# Patient Record
Sex: Female | Born: 1955 | Race: White | Hispanic: No | Marital: Married | Smoking: Never smoker
Health system: Southern US, Community
[De-identification: ages and names within clinical notes are randomized; demographics above are authoritative.]

## PROBLEM LIST (undated history)

## (undated) DIAGNOSIS — Z8614 Personal history of Methicillin resistant Staphylococcus aureus infection: Secondary | ICD-10-CM

## (undated) DIAGNOSIS — R112 Nausea with vomiting, unspecified: Secondary | ICD-10-CM

## (undated) DIAGNOSIS — Z9889 Other specified postprocedural states: Secondary | ICD-10-CM

## (undated) DIAGNOSIS — Z8709 Personal history of other diseases of the respiratory system: Secondary | ICD-10-CM

## (undated) DIAGNOSIS — M779 Enthesopathy, unspecified: Secondary | ICD-10-CM

## (undated) DIAGNOSIS — R42 Dizziness and giddiness: Secondary | ICD-10-CM

## (undated) DIAGNOSIS — M722 Plantar fascial fibromatosis: Secondary | ICD-10-CM

## (undated) DIAGNOSIS — M2550 Pain in unspecified joint: Secondary | ICD-10-CM

## (undated) DIAGNOSIS — M1990 Unspecified osteoarthritis, unspecified site: Secondary | ICD-10-CM

## (undated) DIAGNOSIS — M2540 Effusion, unspecified joint: Secondary | ICD-10-CM

## (undated) DIAGNOSIS — G43909 Migraine, unspecified, not intractable, without status migrainosus: Secondary | ICD-10-CM

## (undated) DIAGNOSIS — M419 Scoliosis, unspecified: Secondary | ICD-10-CM

## (undated) DIAGNOSIS — G4700 Insomnia, unspecified: Secondary | ICD-10-CM

## (undated) HISTORY — PX: MOUTH SURGERY: SHX715

## (undated) HISTORY — PX: ABDOMINAL HYSTERECTOMY: SHX81

## (undated) HISTORY — DX: Scoliosis, unspecified: M41.9

## (undated) HISTORY — DX: Dizziness and giddiness: R42

## (undated) HISTORY — PX: EXPLORATORY LAPAROTOMY: SUR591

---

## 1998-01-31 ENCOUNTER — Other Ambulatory Visit: Admission: RE | Admit: 1998-01-31 | Discharge: 1998-01-31 | Payer: Self-pay | Admitting: *Deleted

## 1998-02-06 ENCOUNTER — Ambulatory Visit (HOSPITAL_COMMUNITY): Admission: RE | Admit: 1998-02-06 | Discharge: 1998-02-06 | Payer: Self-pay | Admitting: *Deleted

## 1999-01-21 ENCOUNTER — Ambulatory Visit (HOSPITAL_COMMUNITY): Admission: RE | Admit: 1999-01-21 | Discharge: 1999-01-21 | Payer: Self-pay | Admitting: *Deleted

## 1999-02-24 ENCOUNTER — Other Ambulatory Visit: Admission: RE | Admit: 1999-02-24 | Discharge: 1999-02-24 | Payer: Self-pay | Admitting: *Deleted

## 2000-02-03 ENCOUNTER — Ambulatory Visit (HOSPITAL_COMMUNITY): Admission: RE | Admit: 2000-02-03 | Discharge: 2000-02-03 | Payer: Self-pay | Admitting: *Deleted

## 2000-02-19 ENCOUNTER — Other Ambulatory Visit: Admission: RE | Admit: 2000-02-19 | Discharge: 2000-02-19 | Payer: Self-pay | Admitting: *Deleted

## 2001-02-14 ENCOUNTER — Ambulatory Visit (HOSPITAL_COMMUNITY): Admission: RE | Admit: 2001-02-14 | Discharge: 2001-02-14 | Payer: Self-pay | Admitting: *Deleted

## 2001-02-22 ENCOUNTER — Other Ambulatory Visit: Admission: RE | Admit: 2001-02-22 | Discharge: 2001-02-22 | Payer: Self-pay | Admitting: *Deleted

## 2002-01-23 ENCOUNTER — Other Ambulatory Visit: Admission: RE | Admit: 2002-01-23 | Discharge: 2002-01-23 | Payer: Self-pay | Admitting: *Deleted

## 2002-02-15 ENCOUNTER — Ambulatory Visit (HOSPITAL_COMMUNITY): Admission: RE | Admit: 2002-02-15 | Discharge: 2002-02-15 | Payer: Self-pay | Admitting: Obstetrics and Gynecology

## 2002-04-26 ENCOUNTER — Other Ambulatory Visit: Admission: RE | Admit: 2002-04-26 | Discharge: 2002-04-26 | Payer: Self-pay | Admitting: *Deleted

## 2002-11-22 ENCOUNTER — Other Ambulatory Visit: Admission: RE | Admit: 2002-11-22 | Discharge: 2002-11-22 | Payer: Self-pay | Admitting: *Deleted

## 2003-01-31 ENCOUNTER — Other Ambulatory Visit: Admission: RE | Admit: 2003-01-31 | Discharge: 2003-01-31 | Payer: Self-pay | Admitting: *Deleted

## 2003-02-19 ENCOUNTER — Encounter: Payer: Self-pay | Admitting: *Deleted

## 2003-02-19 ENCOUNTER — Ambulatory Visit (HOSPITAL_COMMUNITY): Admission: RE | Admit: 2003-02-19 | Discharge: 2003-02-19 | Payer: Self-pay | Admitting: *Deleted

## 2004-02-04 ENCOUNTER — Other Ambulatory Visit: Admission: RE | Admit: 2004-02-04 | Discharge: 2004-02-04 | Payer: Self-pay | Admitting: *Deleted

## 2004-02-20 ENCOUNTER — Ambulatory Visit (HOSPITAL_COMMUNITY): Admission: RE | Admit: 2004-02-20 | Discharge: 2004-02-20 | Payer: Self-pay | Admitting: *Deleted

## 2005-01-22 ENCOUNTER — Ambulatory Visit (HOSPITAL_COMMUNITY): Admission: RE | Admit: 2005-01-22 | Discharge: 2005-01-22 | Payer: Self-pay | Admitting: *Deleted

## 2005-01-29 ENCOUNTER — Other Ambulatory Visit: Admission: RE | Admit: 2005-01-29 | Discharge: 2005-01-29 | Payer: Self-pay | Admitting: *Deleted

## 2006-01-25 ENCOUNTER — Ambulatory Visit (HOSPITAL_COMMUNITY): Admission: RE | Admit: 2006-01-25 | Discharge: 2006-01-25 | Payer: Self-pay | Admitting: *Deleted

## 2006-02-01 ENCOUNTER — Other Ambulatory Visit: Admission: RE | Admit: 2006-02-01 | Discharge: 2006-02-01 | Payer: Self-pay | Admitting: *Deleted

## 2007-01-27 ENCOUNTER — Ambulatory Visit (HOSPITAL_COMMUNITY): Admission: RE | Admit: 2007-01-27 | Discharge: 2007-01-27 | Payer: Self-pay | Admitting: *Deleted

## 2007-02-17 ENCOUNTER — Other Ambulatory Visit: Admission: RE | Admit: 2007-02-17 | Discharge: 2007-02-17 | Payer: Self-pay | Admitting: *Deleted

## 2008-01-30 ENCOUNTER — Ambulatory Visit (HOSPITAL_COMMUNITY): Admission: RE | Admit: 2008-01-30 | Discharge: 2008-01-30 | Payer: Self-pay | Admitting: Gynecology

## 2008-04-03 ENCOUNTER — Other Ambulatory Visit: Admission: RE | Admit: 2008-04-03 | Discharge: 2008-04-03 | Payer: Self-pay | Admitting: Gynecology

## 2008-04-23 ENCOUNTER — Ambulatory Visit (HOSPITAL_COMMUNITY): Admission: RE | Admit: 2008-04-23 | Discharge: 2008-04-23 | Payer: Self-pay | Admitting: Gynecology

## 2008-07-02 ENCOUNTER — Encounter (HOSPITAL_COMMUNITY): Admission: RE | Admit: 2008-07-02 | Discharge: 2008-08-01 | Payer: Self-pay | Admitting: Chiropractic Medicine

## 2008-10-24 ENCOUNTER — Ambulatory Visit: Admission: RE | Admit: 2008-10-24 | Discharge: 2008-10-24 | Payer: Self-pay | Admitting: Gynecologic Oncology

## 2009-02-01 ENCOUNTER — Ambulatory Visit (HOSPITAL_COMMUNITY): Admission: RE | Admit: 2009-02-01 | Discharge: 2009-02-01 | Payer: Self-pay | Admitting: Gynecology

## 2009-04-16 ENCOUNTER — Ambulatory Visit (HOSPITAL_COMMUNITY): Admission: RE | Admit: 2009-04-16 | Discharge: 2009-04-16 | Payer: Self-pay | Admitting: Gynecologic Oncology

## 2009-11-25 ENCOUNTER — Other Ambulatory Visit: Admission: RE | Admit: 2009-11-25 | Discharge: 2009-11-25 | Payer: Self-pay | Admitting: Obstetrics & Gynecology

## 2010-02-03 ENCOUNTER — Ambulatory Visit (HOSPITAL_COMMUNITY): Admission: RE | Admit: 2010-02-03 | Discharge: 2010-02-03 | Payer: Self-pay | Admitting: Obstetrics & Gynecology

## 2010-02-12 ENCOUNTER — Ambulatory Visit (HOSPITAL_COMMUNITY): Admission: RE | Admit: 2010-02-12 | Discharge: 2010-02-12 | Payer: Self-pay | Admitting: Obstetrics & Gynecology

## 2010-04-02 ENCOUNTER — Ambulatory Visit (HOSPITAL_COMMUNITY): Admission: RE | Admit: 2010-04-02 | Discharge: 2010-04-02 | Payer: Self-pay | Admitting: Obstetrics & Gynecology

## 2010-04-02 ENCOUNTER — Ambulatory Visit: Payer: Self-pay | Admitting: Obstetrics & Gynecology

## 2010-08-18 ENCOUNTER — Encounter: Payer: Self-pay | Admitting: Gynecology

## 2010-10-09 LAB — URINALYSIS, ROUTINE W REFLEX MICROSCOPIC
Glucose, UA: NEGATIVE mg/dL
Hgb urine dipstick: NEGATIVE
Ketones, ur: NEGATIVE mg/dL
Protein, ur: NEGATIVE mg/dL

## 2010-10-09 LAB — COMPREHENSIVE METABOLIC PANEL
ALT: 18 U/L (ref 0–35)
Alkaline Phosphatase: 50 U/L (ref 39–117)
CO2: 26 mEq/L (ref 19–32)
Chloride: 105 mEq/L (ref 96–112)
GFR calc non Af Amer: >60 mL/min (ref >60)
Glucose, Bld: 76 mg/dL (ref 70–99)
Potassium: 3.9 mEq/L (ref 3.5–5.1)
Sodium: 138 mEq/L (ref 135–145)
Total Bilirubin: 0.7 mg/dL (ref 0.3–1.2)

## 2010-10-09 LAB — HCG, QUANTITATIVE, PREGNANCY: hCG, Beta Chain, Quant, S: 2 m[IU]/mL (ref <5)

## 2010-10-09 LAB — CBC
HCT: 39 % (ref 36.0–46.0)
Hemoglobin: 13.1 g/dL (ref 12.0–15.0)
WBC: 7.9 10*3/uL (ref 4.0–10.5)

## 2010-10-09 LAB — TYPE AND SCREEN: ABO/RH(D): O POS

## 2010-12-01 ENCOUNTER — Other Ambulatory Visit (HOSPITAL_COMMUNITY)
Admission: RE | Admit: 2010-12-01 | Discharge: 2010-12-01 | Disposition: A | Discharge status: Home / Self Care | Payer: BC Managed Care – PPO | Source: Ambulatory Visit | Attending: Obstetrics & Gynecology | Admitting: Obstetrics & Gynecology

## 2010-12-01 ENCOUNTER — Other Ambulatory Visit: Payer: Self-pay | Admitting: Obstetrics & Gynecology

## 2010-12-01 DIAGNOSIS — Z01419 Encounter for gynecological examination (general) (routine) without abnormal findings: Secondary | ICD-10-CM | POA: Insufficient documentation

## 2010-12-09 NOTE — Consult Note (Signed)
NAME:  Jasmine Ruiz, Jasmine Ruiz                ACCOUNT NO.:  [id number]   MEDICAL RECORD NO.:  [id number]          PATIENT TYPE:  OUT   LOCATION:  GYN                          FACILITY:  [facility]   PHYSICIAN:  John T. Kyla Balzarine, M.D.    DATE OF BIRTH:  September 24, 1955   DATE OF CONSULTATION:  10/24/2008  DATE OF DISCHARGE:                                 CONSULTATION   REQUESTING PHYSICIAN:  Leatha Gilding. Mezer, M.D.   CHIEF COMPLAINT:  This patient is seen at the request of Dr. Chevis Pretty for  evaluation of a right ovarian cyst and recommendations regarding  management.   HISTORY OF PRESENT ILLNESS:  This patient has a longstanding history of  endometriosis and ovarian cysts, documented by laparoscopy in February  1999.  At that time, she was found to have extensive pelvic adhesions  and endometriosis with benign ovarian cysts at the time of laparoscopy  by Dr. Randell Patient.  He had followed her intermittently with ultrasounds  revealing a small (3+ cm) right ovarian cyst and she has been followed  by Dr. Chevis Pretty since September 2009.  She had been on oral contraceptives  continuously and these were discontinued in September 2009.  She had no  bleeding and has had improved hot flashes and night sweats since that  time.  An ultrasound at that time revealed a 3.4-cm cyst with thin  internal septations and no abnormal blood flow involving the right  ovary.  The uterus was normal with small subserosal fibroids measuring  up to 3.3 cm.  There was no free fluid.  CA-125 values have been all  less than 10, was postmenopausal after oral contraceptives were  discontinued.  Her initial ultrasound was performed at the [facility] [place] and subsequent ultrasounds have been performed in  Dr. Corwin Levins office with slightly different measurements, 4.2-cm with  internal echoes in September 2009, and 3.7 x 2 cm in December 2009, and  most recently 5 x 3.6 cm with more prominent internal echoes.  Most  recent CA-125 value  remained 8.6.  The patient denies any change in  bowel or bladder function, early satiety or bloating.  She denies pelvic  pain or dyspareunia.   PAST MEDICAL HISTORY:  1. Migraine headaches.  2. Endometriosis.  3. Ovarian cysts as above.  4. She is a nulligravida.  5. Post laparoscopic lysis of adhesions in 1999.   MEDICATIONS:  1. Relpax p.r.n. migraines.  2. Jeffie Pollock PM.   ALLERGIES:  SULFA DRUGS.   FAMILY MEDICAL HISTORY:  No known cancers.   PERSONAL AND SOCIAL HISTORY:  The patient denies tobacco or ethanol use.   REVIEW OF SYSTEMS:  Otherwise negative in a comprehensive 10-point  review.   PHYSICAL EXAMINATION:  VITAL SIGNS:  Stable and afebrile with blood  pressure 130/70.  GENERAL:  The patient is anxious, alert and oriented x3 in no acute  distress.  ENT:  Benign with clear oropharynx.  NECK:  Supple without goiter.  LYMPH SURVEY:  No pathologic lymphadenopathy.  BACK:  No spinous or CVA tenderness.  ABDOMEN:  Soft and benign with  well-healed laparoscopic trocar site.  No  hernia, ascites or mass.  EXTREMITIES:  Full strength and range of  motion without edema, cords or Homan's.  PELVIC:  External genitalia, BUS, bladder and urethra normal.  The  vagina and cervix have no lesions and there is no cervical motion  tenderness.  Bimanual and rectovaginal examinations reveal upper limit  size uterus with no adnexal mass, nodularity or tenderness.   LABS:  Reviewed as above.   ASSESSMENT:  Persistent postmenopausal cyst with normal CA-125 values  and minimal change over ultrasounds performed at different institutions.  I believe that the likelihood of malignancy in this cyst is less than  0.5% and I had a long discussion with the patient regarding management.  Because of her very low risk of malignancy, which is lower than the risk  of complications for major surgery, I would not recommend operative  intervention at present for this asymptomatic cyst.  I would  recommend  ultrasounds at 87-month intervals and would ensure that these are being  done at the same institution; she was scheduled for follow-up ultrasound  at [facility] in September.  A report will be copied that to Dr.  Chevis Pretty.  The indications for surgery would be enlargement above 6-7 cm,  which would increase her risk for torsion or the development of features  that suggest malignancy.  Additionally, if the patient becomes  symptomatic or has a documented elevation of CA-125, operative  intervention would be warranted.  We would be glad to see her back at  any time on a p.r.n. basis.      John T. Kyla Balzarine, M.D.  Electronically Signed     JTS/MEDQ  D:  10/24/2008  T:  10/24/2008  Job:  [id number]   cc:   Leatha Gilding. Mezer, M.D.  Fax: [phone]   Telford Nab, R.N.  [address]  [place], [place] [address]

## 2011-01-13 ENCOUNTER — Other Ambulatory Visit: Payer: Self-pay | Admitting: Obstetrics & Gynecology

## 2011-01-13 DIAGNOSIS — Z139 Encounter for screening, unspecified: Secondary | ICD-10-CM

## 2011-02-09 ENCOUNTER — Ambulatory Visit (HOSPITAL_COMMUNITY)
Admission: RE | Admit: 2011-02-09 | Discharge: 2011-02-09 | Disposition: A | Discharge status: Home / Self Care | Payer: BC Managed Care – PPO | Source: Ambulatory Visit | Attending: Obstetrics & Gynecology | Admitting: Obstetrics & Gynecology

## 2011-02-09 DIAGNOSIS — Z1231 Encounter for screening mammogram for malignant neoplasm of breast: Secondary | ICD-10-CM | POA: Insufficient documentation

## 2011-02-09 DIAGNOSIS — Z139 Encounter for screening, unspecified: Secondary | ICD-10-CM

## 2011-12-02 ENCOUNTER — Other Ambulatory Visit: Payer: Self-pay | Admitting: Obstetrics & Gynecology

## 2011-12-02 ENCOUNTER — Other Ambulatory Visit (HOSPITAL_COMMUNITY)
Admission: RE | Admit: 2011-12-02 | Discharge: 2011-12-02 | Disposition: A | Discharge status: Home / Self Care | Payer: BC Managed Care – PPO | Source: Ambulatory Visit | Attending: Obstetrics & Gynecology | Admitting: Obstetrics & Gynecology

## 2011-12-02 DIAGNOSIS — Z01419 Encounter for gynecological examination (general) (routine) without abnormal findings: Secondary | ICD-10-CM | POA: Insufficient documentation

## 2012-01-04 ENCOUNTER — Other Ambulatory Visit: Payer: Self-pay | Admitting: Obstetrics & Gynecology

## 2012-01-04 DIAGNOSIS — Z139 Encounter for screening, unspecified: Secondary | ICD-10-CM

## 2012-02-11 ENCOUNTER — Ambulatory Visit (HOSPITAL_COMMUNITY)
Admission: RE | Admit: 2012-02-11 | Discharge: 2012-02-11 | Disposition: A | Discharge status: Home / Self Care | Payer: BC Managed Care – PPO | Source: Ambulatory Visit | Attending: Obstetrics & Gynecology | Admitting: Obstetrics & Gynecology

## 2012-02-11 DIAGNOSIS — Z139 Encounter for screening, unspecified: Secondary | ICD-10-CM

## 2012-02-11 DIAGNOSIS — Z1231 Encounter for screening mammogram for malignant neoplasm of breast: Secondary | ICD-10-CM | POA: Insufficient documentation

## 2012-02-28 ENCOUNTER — Encounter (HOSPITAL_COMMUNITY): Payer: Self-pay | Admitting: *Deleted

## 2012-02-28 ENCOUNTER — Emergency Department (HOSPITAL_COMMUNITY)
Admission: EM | Admit: 2012-02-28 | Discharge: 2012-02-28 | Discharge status: Against Medical Advice | Payer: BC Managed Care – PPO | Attending: Emergency Medicine | Admitting: Emergency Medicine

## 2012-02-28 DIAGNOSIS — Z0389 Encounter for observation for other suspected diseases and conditions ruled out: Secondary | ICD-10-CM | POA: Insufficient documentation

## 2012-02-28 HISTORY — DX: Migraine, unspecified, not intractable, without status migrainosus: G43.909

## 2012-02-28 NOTE — ED Notes (Signed)
Pt reporting having oral surgery on Monday, since that time has had problems with edema and some difficulty swallowing.

## 2012-02-28 NOTE — ED Notes (Signed)
Patient not in room

## 2012-02-29 ENCOUNTER — Encounter (HOSPITAL_COMMUNITY): Payer: Self-pay | Admitting: *Deleted

## 2012-02-29 ENCOUNTER — Emergency Department (HOSPITAL_COMMUNITY)
Admission: EM | Admit: 2012-02-29 | Discharge: 2012-02-29 | Disposition: A | Discharge status: Home / Self Care | Payer: BC Managed Care – PPO | Attending: Emergency Medicine | Admitting: Emergency Medicine

## 2012-02-29 DIAGNOSIS — Z882 Allergy status to sulfonamides status: Secondary | ICD-10-CM | POA: Insufficient documentation

## 2012-02-29 DIAGNOSIS — Z98811 Dental restoration status: Secondary | ICD-10-CM | POA: Insufficient documentation

## 2012-02-29 DIAGNOSIS — Z79899 Other long term (current) drug therapy: Secondary | ICD-10-CM | POA: Insufficient documentation

## 2012-02-29 DIAGNOSIS — T85848A Pain due to other internal prosthetic devices, implants and grafts, initial encounter: Secondary | ICD-10-CM

## 2012-02-29 DIAGNOSIS — G43909 Migraine, unspecified, not intractable, without status migrainosus: Secondary | ICD-10-CM | POA: Insufficient documentation

## 2012-02-29 DIAGNOSIS — Z885 Allergy status to narcotic agent status: Secondary | ICD-10-CM | POA: Insufficient documentation

## 2012-02-29 DIAGNOSIS — K089 Disorder of teeth and supporting structures, unspecified: Secondary | ICD-10-CM | POA: Insufficient documentation

## 2012-02-29 NOTE — ED Provider Notes (Signed)
History     CSN: [id number]  Arrival date & time 02/29/12  0401   First MD Initiated Contact with Patient 02/29/12 [phone]      Chief Complaint  Patient presents with  . Dental Pain    (Consider location/radiation/quality/duration/timing/severity/associated sxs/prior treatment) Patient is a 56 y.o. female presenting with tooth pain. The history is provided by the patient.  Dental PainThe primary symptoms include mouth pain. Primary symptoms do not include shortness of breath, angioedema or cough. Primary symptoms comment: recent dental implant last week The symptoms began 5 to 7 days ago. The symptoms are worsening. The symptoms are new. The symptoms occur constantly.  Additional symptoms include: gum swelling, gum tenderness, trismus, jaw pain, facial swelling, trouble swallowing, pain with swallowing and ear pain. Additional symptoms do not include: excessive salivation, smell disturbance, drooling and hearing loss. Associated symptoms comments: Had dental implants placed last week. Was seen by her dentist on Thursday and given a shot of antibiotics and placed on an antibiotic as well as pain medication. Patient is still currently taking her antibiotics but stated the pain was not improving..    Past Medical History  Diagnosis Date  . Migraine     Past Surgical History  Procedure Date  . Mouth surgery   . Abdominal hysterectomy     No family history on file.  History  Substance Use Topics  . Smoking status: Never Smoker   . Smokeless tobacco: Not on file  . Alcohol Use: No    OB History    Grav Para Term Preterm Abortions TAB SAB Ect Mult Living                  Review of Systems  HENT: Positive for ear pain, facial swelling and trouble swallowing. Negative for hearing loss and drooling.   Respiratory: Negative for cough and shortness of breath.   All other systems reviewed and are negative.    Allergies  Sulfa antibiotics and Vicodin  Home Medications    Current Outpatient Rx  Name Route Sig Dispense Refill  . VITAMIN C PO Oral Take 1 tablet by mouth daily.    Marland Kitchen ELETRIPTAN HYDROBROMIDE 40 MG PO TABS Oral One tablet by mouth at onset of headache. May repeat in 2 hours if headache persists or recurs. may repeat in 2 hours if necessary for migraine    . DIVIGEL TD Transdermal Place 1 application onto the skin daily as needed. For hot flashes    . IBUPROFEN 200 MG PO TABS Oral Take 600 mg by mouth every 6 (six) hours as needed. For pain    . KRILL OIL PO Oral Take 1 tablet by mouth daily.    . ADULT MULTIVITAMIN LIQUID CH Oral Take 5 mLs by mouth daily.    . OXYCODONE-ACETAMINOPHEN 5-325 MG PO TABS Oral Take 1 tablet by mouth every 4 (four) hours as needed. For pain    . VITAMIN E PO Oral Take 1 tablet by mouth daily.      BP 137/75  Pulse 74  Temp 98.2 F (36.8 C) (Oral)  Resp 14  Physical Exam  Nursing note and vitals reviewed. Constitutional: She is oriented to person, place, and time. She appears well-developed and well-nourished. No distress.  HENT:  Head: Normocephalic and atraumatic.  Mouth/Throat: Oropharynx is clear and moist.         No swelling under the tongue.  Pharynx is normal appearing without swelling  Eyes: Conjunctivae and EOM are normal. Pupils are  equal, round, and reactive to light.  Neck: Normal range of motion. Neck supple. No tracheal deviation present.  Musculoskeletal: Normal range of motion. She exhibits no tenderness.  Lymphadenopathy:    She has no cervical adenopathy.  Neurological: She is alert and oriented to person, place, and time.  Skin: Skin is warm and dry. No rash noted. No erythema.  Psychiatric: She has a normal mood and affect. Her behavior is normal.    ED Course  Procedures (including critical care time)  Labs Reviewed - No data to display No results found.   1. Dental implant pain       MDM   Pt with recent dental implant and dental pain with facial swelling. Pt saw her  dentist on Friday and was given abx and percocet.  Pt states percocet works but was afraid to continue taking it.  She states that it is painful to swallow however no difficulty swallowing on exam now and no uvula edema or other abnormalities.  No stridor or other concerns.  No signs of ludwig's angina or difficulty breathing and no systemic symptoms. Will have pt f/u with dentist later today as scheduled and continue taking her percocet.        Gwyneth Sprout, MD 02/29/12 [phone]

## 2012-02-29 NOTE — ED Notes (Signed)
C/O pain from oral surgery last Monday, stated went to see the oral surgeon on friday was given ABt and more pain. Pain still getting worst even with the medication. Having problem opening mouth.

## 2012-02-29 NOTE — ED Notes (Signed)
Here by POV, here alone, LWBS from [facility] last night, C/o R lower dental pain, had implant placed Thursday, swelling and pain since then, radiation of pain into R jaw ear and cheek. denies fever or other sx. Swelling noted to R jaw.

## 2012-07-27 HISTORY — PX: KNEE ARTHROSCOPY: SUR90

## 2013-01-04 ENCOUNTER — Encounter: Payer: Self-pay | Admitting: Obstetrics & Gynecology

## 2013-01-04 ENCOUNTER — Ambulatory Visit (INDEPENDENT_AMBULATORY_CARE_PROVIDER_SITE_OTHER): Payer: BC Managed Care – PPO | Admitting: Obstetrics & Gynecology

## 2013-01-04 VITALS — BP 120/80 | Ht 64.0 in | Wt 147.0 lb

## 2013-01-04 DIAGNOSIS — Z1212 Encounter for screening for malignant neoplasm of rectum: Secondary | ICD-10-CM

## 2013-01-04 DIAGNOSIS — Z01419 Encounter for gynecological examination (general) (routine) without abnormal findings: Secondary | ICD-10-CM

## 2013-01-04 NOTE — Progress Notes (Signed)
Patient ID: Jasmine Ruiz, female   DOB: [birth year], 57 y.o.   MRN: [id number] Subjective:     Jasmine Ruiz is a 57 y.o. female here for a routine exam.  No LMP recorded. Patient has had a hysterectomy. No obstetric history on file. Current complaints: none.  Personal health questionnaire reviewed: no.   Gynecologic History No LMP recorded. Patient has had a hysterectomy. Contraception: status post hysterectomy Last Pap: 2011. Results were: normal Last mammogram: 2014. Results were: normal  Obstetric History OB History   Grav Para Term Preterm Abortions TAB SAB Ect Mult Living                   The following portions of the patient's history were reviewed and updated as appropriate: allergies, current medications, past family history, past medical history, past social history, past surgical history and problem list.  Review of Systems  Review of Systems  Constitutional: Negative for fever, chills, weight loss, malaise/fatigue and diaphoresis.  HENT: Negative for hearing loss, ear pain, nosebleeds, congestion, sore throat, neck pain, tinnitus and ear discharge.   Eyes: Negative for blurred vision, double vision, photophobia, pain, discharge and redness.  Respiratory: Negative for cough, hemoptysis, sputum production, shortness of breath, wheezing and stridor.   Cardiovascular: Negative for chest pain, palpitations, orthopnea, claudication, leg swelling and PND.  Gastrointestinal: negative for abdominal pain. Negative for heartburn, nausea, vomiting, diarrhea, constipation, blood in stool and melena.  Genitourinary: Negative for dysuria, urgency, frequency, hematuria and flank pain.  Musculoskeletal: Negative for myalgias, back pain, joint pain and falls.  Skin: Negative for itching and rash.  Neurological: Negative for dizziness, tingling, tremors, sensory change, speech change, focal weakness, seizures, loss of consciousness, weakness and headaches.  Endo/Heme/Allergies: Negative  for environmental allergies and polydipsia. Does not bruise/bleed easily.  Psychiatric/Behavioral: Negative for depression, suicidal ideas, hallucinations, memory loss and substance abuse. The patient is not nervous/anxious and does not have insomnia.        Objective:    Physical Exam  Vitals reviewed. Constitutional: She is oriented to person, place, and time. She appears well-developed and well-nourished.  HENT:  Head: Normocephalic and atraumatic.        Right Ear: External ear normal.  Left Ear: External ear normal.  Nose: Nose normal.  Mouth/Throat: Oropharynx is clear and moist.  Eyes: Conjunctivae and EOM are normal. Pupils are equal, round, and reactive to light. Right eye exhibits no discharge. Left eye exhibits no discharge. No scleral icterus.  Neck: Normal range of motion. Neck supple. No tracheal deviation present. No thyromegaly present.  Cardiovascular: Normal rate, regular rhythm, normal heart sounds and intact distal pulses.  Exam reveals no gallop and no friction rub.   No murmur heard. Respiratory: Effort normal and breath sounds normal. No respiratory distress. She has no wheezes. She has no rales. She exhibits no tenderness.  GI: Soft. Bowel sounds are normal. She exhibits no distension and no mass. There is no tenderness. There is no rebound and no guarding.  Genitourinary:       Vulva is normal without lesions Vagina is pink moist without discharge Cervix absent Uterus absent Adnexa absent Rectal hemeoccult negative no masses normal tone Musculoskeletal: Normal range of motion. She exhibits no edema and no tenderness.  Neurological: She is alert and oriented to person, place, and time. She has normal reflexes. She displays normal reflexes. No cranial nerve deficit. She exhibits normal muscle tone. Coordination normal.  Skin: Skin is warm and dry. No rash  noted. No erythema. No pallor.  Psychiatric: She has a normal mood and affect. Her behavior is normal.  Judgment and thought content normal.       Assessment:    Healthy female exam.    Plan:    Follow up in: 1 year.

## 2013-01-05 ENCOUNTER — Other Ambulatory Visit: Payer: Self-pay | Admitting: Obstetrics & Gynecology

## 2013-01-20 ENCOUNTER — Other Ambulatory Visit: Payer: Self-pay | Admitting: Obstetrics & Gynecology

## 2013-01-20 DIAGNOSIS — Z139 Encounter for screening, unspecified: Secondary | ICD-10-CM

## 2013-02-13 ENCOUNTER — Ambulatory Visit (HOSPITAL_COMMUNITY)
Admission: RE | Admit: 2013-02-13 | Discharge: 2013-02-13 | Disposition: A | Discharge status: Home / Self Care | Payer: BC Managed Care – PPO | Source: Ambulatory Visit | Attending: Obstetrics & Gynecology | Admitting: Obstetrics & Gynecology

## 2013-02-13 DIAGNOSIS — Z139 Encounter for screening, unspecified: Secondary | ICD-10-CM

## 2013-02-13 DIAGNOSIS — Z1231 Encounter for screening mammogram for malignant neoplasm of breast: Secondary | ICD-10-CM | POA: Insufficient documentation

## 2013-03-13 ENCOUNTER — Other Ambulatory Visit: Payer: Self-pay | Admitting: Obstetrics & Gynecology

## 2013-04-13 ENCOUNTER — Ambulatory Visit (INDEPENDENT_AMBULATORY_CARE_PROVIDER_SITE_OTHER): Payer: BC Managed Care – PPO | Admitting: Family Medicine

## 2013-04-13 ENCOUNTER — Encounter: Payer: Self-pay | Admitting: Family Medicine

## 2013-04-13 VITALS — BP 128/66 | HR 72 | Temp 97.9°F | Resp 14 | Ht 64.0 in | Wt 149.0 lb

## 2013-04-13 DIAGNOSIS — G43909 Migraine, unspecified, not intractable, without status migrainosus: Secondary | ICD-10-CM

## 2013-04-13 MED ORDER — ELETRIPTAN HYDROBROMIDE 40 MG PO TABS: 40.0000 mg | ORAL_TABLET | ORAL | Status: DC | PRN

## 2013-04-13 NOTE — Progress Notes (Signed)
  Subjective:    Patient ID: Jasmine Ruiz, female    DOB: 1955/08/02, 57 y.o.   MRN: [id number]  HPI Patient is a very pleasant 57 year old white female who has a history of migraine headaches. She is having her entire life. They used to be triggered by her menstrual cycles. Now they tend to be triggered by stress or anxiety. She has 5-6 headaches a month. They're usually mild if she is able to take the Relpax at the first sign of migraine.  She is requesting a refill on Relpax 40 mg by mouth daily when necessary migraine headache. Past Medical History  Diagnosis Date  . Migraine    Current Outpatient Prescriptions on File Prior to Visit  Medication Sig Dispense Refill  . Ascorbic Acid (VITAMIN C PO) Take 1 tablet by mouth daily.      . Estradiol (DIVIGEL TD) Place 1 application onto the skin daily as needed. For hot flashes      . ibuprofen (ADVIL,MOTRIN) 200 MG tablet Take 600 mg by mouth every 6 (six) hours as needed. For pain      . Multiple Vitamin (MULTIVITAMIN) LIQD Take 5 mLs by mouth daily.       No current facility-administered medications on file prior to visit.   Allergies  Allergen Reactions  . Sulfa Antibiotics     unknown  . Vicodin [Hydrocodone-Acetaminophen] Nausea And Vomiting   History   Social History  . Marital Status: Married    Spouse Name: N/A    Number of Children: N/A  . Years of Education: N/A   Occupational History  . Not on file.   Social History Main Topics  . Smoking status: Never Smoker   . Smokeless tobacco: Not on file  . Alcohol Use: No  . Drug Use: No  . Sexual Activity:    Other Topics Concern  . Not on file   Social History Narrative  . No narrative on file      Review of Systems  All other systems reviewed and are negative.       Objective:   Physical Exam  Vitals reviewed. Constitutional: She is oriented to person, place, and time.  Cardiovascular: Normal rate and regular rhythm.   Pulmonary/Chest: Effort normal  and breath sounds normal.  Neurological: She is alert and oriented to person, place, and time. She has normal reflexes. No cranial nerve deficit. She exhibits normal muscle tone. Coordination normal.  Psychiatric: She has a normal mood and affect. Her behavior is normal. Judgment and thought content normal.          Assessment & Plan:  1. Migraines Refill the patient's Relpax 40 mg tablets.  She can have 6 per month. If the frequency of migraines intensify would recommend Topamax as a preventative.. - eletriptan (RELPAX) 40 MG tablet; Take 1 tablet (40 mg total) by mouth as needed. may repeat in 2 hours if necessary for migraine  Dispense: 10 tablet; Refill: 5

## 2013-07-27 DIAGNOSIS — Z8709 Personal history of other diseases of the respiratory system: Secondary | ICD-10-CM

## 2013-07-27 HISTORY — DX: Personal history of other diseases of the respiratory system: Z87.09

## 2013-08-24 ENCOUNTER — Ambulatory Visit: Payer: BC Managed Care – PPO | Admitting: Family Medicine

## 2013-09-15 ENCOUNTER — Ambulatory Visit: Payer: BC Managed Care – PPO | Admitting: Family Medicine

## 2013-09-18 ENCOUNTER — Encounter: Payer: Self-pay | Admitting: Family Medicine

## 2013-09-18 ENCOUNTER — Ambulatory Visit (INDEPENDENT_AMBULATORY_CARE_PROVIDER_SITE_OTHER): Payer: BC Managed Care – PPO | Admitting: Family Medicine

## 2013-09-18 VITALS — BP 122/70 | HR 78 | Temp 97.0°F | Resp 16 | Ht 65.0 in | Wt 152.0 lb

## 2013-09-18 DIAGNOSIS — Z01810 Encounter for preprocedural cardiovascular examination: Secondary | ICD-10-CM

## 2013-09-18 DIAGNOSIS — Z23 Encounter for immunization: Secondary | ICD-10-CM

## 2013-09-18 NOTE — Progress Notes (Signed)
   Subjective:    Patient ID: Jasmine Ruiz, female    DOB: October 05, 1955, 58 y.o.   MRN: [id number]  HPI Patient is here today for preoperative clearance. She is scheduled to have left total knee replacement. She denies any chest pain shortness of breath or dyspnea on exertion. She is having no medical problems at the present time other than her left knee pain. She has no history of renal failure congestive heart failure. She has no history of DVT or PE. She does have a family history of DVTs and PEs in her father. Past Medical History  Diagnosis Date  . Migraine    Past Surgical History  Procedure Laterality Date  . Mouth surgery    . Abdominal hysterectomy     Allergies  Allergen Reactions  . Sulfa Antibiotics     unknown  . Vicodin [Hydrocodone-Acetaminophen] Nausea And Vomiting   History   Social History  . Marital Status: Married    Spouse Name: N/A    Number of Children: N/A  . Years of Education: N/A   Occupational History  . Not on file.   Social History Main Topics  . Smoking status: Never Smoker   . Smokeless tobacco: Not on file  . Alcohol Use: No  . Drug Use: No  . Sexual Activity:    Other Topics Concern  . Not on file   Social History Narrative  . No narrative on file   Family history significant for a mother with dementia. Her father has a history of DVT/PE.    Review of Systems  All other systems reviewed and are negative.       Objective:   Physical Exam  Vitals reviewed. Constitutional: She is oriented to person, place, and time. She appears well-developed and well-nourished. No distress.  Eyes: Conjunctivae and EOM are normal. Pupils are equal, round, and reactive to light. No scleral icterus.  Neck: Neck supple. No thyromegaly present.  Cardiovascular: Normal rate, regular rhythm, normal heart sounds and intact distal pulses.  Exam reveals no gallop and no friction rub.   No murmur heard. Pulmonary/Chest: Effort normal and breath sounds  normal. No respiratory distress. She has no wheezes. She has no rales. She exhibits no tenderness.  Abdominal: Soft. Bowel sounds are normal. She exhibits no distension and no mass. There is no tenderness. There is no rebound and no guarding.  Musculoskeletal: She exhibits no edema.  Lymphadenopathy:    She has no cervical adenopathy.  Neurological: She is alert and oriented to person, place, and time. She has normal reflexes. She displays normal reflexes. No cranial nerve deficit. She exhibits normal muscle tone. Coordination normal.  Skin: No rash noted. She is not diaphoretic. No erythema. No pallor.   EKG shows normal sinus rhythm at 72 beats per minute with normal intervals and normal axis. There is no evidence of ischemia or infarction       Assessment & Plan:  1. Preop cardiovascular exam Patient's physical exam is completely normal. My biggest concern for this patient with your family history of DVT. I recommended early ambulation after surgery as well as Lovenox postoperatively to prevent DVT. I will check a CBC and CMP. Barring any abnormalities in the lab work the patient will be cleared for surgery. - CBC with Differential - COMPLETE METABOLIC PANEL WITH GFR

## 2013-09-19 LAB — CBC WITH DIFFERENTIAL/PLATELET
BASOS ABS: 0.1 10*3/uL (ref 0.0–0.1)
BASOS PCT: 1 % (ref 0–1)
EOS ABS: 0.2 10*3/uL (ref 0.0–0.7)
Eosinophils Relative: 2 % (ref 0–5)
HEMATOCRIT: 40.2 % (ref 36.0–46.0)
HEMOGLOBIN: 13.4 g/dL (ref 12.0–15.0)
Lymphocytes Relative: 37 % (ref 12–46)
Lymphs Abs: 3.2 10*3/uL (ref 0.7–4.0)
MCH: 30.2 pg (ref 26.0–34.0)
MCHC: 33.3 g/dL (ref 30.0–36.0)
MCV: 90.5 fL (ref 78.0–100.0)
MONO ABS: 0.9 10*3/uL (ref 0.1–1.0)
MONOS PCT: 11 % (ref 3–12)
NEUTROS ABS: 4.2 10*3/uL (ref 1.7–7.7)
NEUTROS PCT: 49 % (ref 43–77)
Platelets: 316 10*3/uL (ref 150–400)
RBC: 4.44 MIL/uL (ref 3.87–5.11)
RDW: 13.2 % (ref 11.5–15.5)
WBC: 8.6 10*3/uL (ref 4.0–10.5)

## 2013-09-19 LAB — COMPLETE METABOLIC PANEL WITH GFR
ALBUMIN: 4.5 g/dL (ref 3.5–5.2)
ALT: 23 U/L (ref 0–35)
AST: 24 U/L (ref 0–37)
Alkaline Phosphatase: 74 U/L (ref 39–117)
BILIRUBIN TOTAL: 0.3 mg/dL (ref 0.2–1.2)
BUN: 13 mg/dL (ref 6–23)
CHLORIDE: 102 meq/L (ref 96–112)
CO2: 30 meq/L (ref 19–32)
Calcium: 9.9 mg/dL (ref 8.4–10.5)
Creat: 0.7 mg/dL (ref 0.50–1.10)
GFR, Est Non African American: >89 mL/min
GLUCOSE: 98 mg/dL (ref 70–99)
POTASSIUM: 4.1 meq/L (ref 3.5–5.3)
SODIUM: 139 meq/L (ref 135–145)
TOTAL PROTEIN: 6.9 g/dL (ref 6.0–8.3)

## 2013-09-26 ENCOUNTER — Other Ambulatory Visit: Payer: Self-pay | Admitting: Physician Assistant

## 2013-09-26 NOTE — H&P (Signed)
TOTAL KNEE ADMISSION H&P  Patient is being admitted for left total knee arthroplasty.  Subjective:  Chief Complaint:left knee pain.  HPI: Jasmine Ruiz, 58 y.o. female, has a history of pain and functional disability in the left knee due to arthritis and has failed non-surgical conservative treatments for greater than 12 weeks to includeNSAID's and/or analgesics, corticosteriod injections, viscosupplementation injections, flexibility and strengthening excercises, use of assistive devices and activity modification.  Onset of symptoms was gradual, starting 5 years ago with gradually worsening course since that time. The patient noted prior procedures on the knee to include  arthroscopy and menisectomy on the left knee(s).  Patient currently rates pain in the left knee(s) at 8 out of 10 with activity. Patient has night pain, worsening of pain with activity and weight bearing, pain that interferes with activities of daily living, pain with passive range of motion, crepitus and joint swelling.  Patient has evidence of periarticular osteophytes and joint space narrowing by imaging studies.There is no active infection.  There are no active problems to display for this patient.  Past Medical History  Diagnosis Date  . Migraine     Past Surgical History  Procedure Laterality Date  . Mouth surgery    . Abdominal hysterectomy       (Not in a hospital admission) Allergies  Allergen Reactions  . Sulfa Antibiotics     unknown  . Vicodin [Hydrocodone-Acetaminophen] Nausea And Vomiting    History  Substance Use Topics  . Smoking status: Never Smoker   . Smokeless tobacco: Not on file  . Alcohol Use: No    No family history on file.   Review of Systems  Constitutional: Negative.   HENT: Negative for congestion, ear discharge, ear pain, hearing loss, nosebleeds, sore throat and tinnitus.   Eyes: Negative.   Respiratory: Negative.  Negative for stridor.   Cardiovascular: Negative.    Gastrointestinal: Negative.   Genitourinary: Negative.   Musculoskeletal: Positive for joint pain. Negative for back pain, falls, myalgias and neck pain.  Skin: Negative.   Neurological: Positive for headaches.  Endo/Heme/Allergies: Negative.   Psychiatric/Behavioral: Negative.     Objective:  Physical Exam  Constitutional: She is oriented to person, place, and time. She appears well-developed and well-nourished. No distress.  HENT:  Head: Normocephalic and atraumatic.  Nose: Nose normal.  Eyes: Conjunctivae and EOM are normal. Pupils are equal, round, and reactive to light.  Neck: Normal range of motion. Neck supple.  Cardiovascular: Normal rate, regular rhythm, normal heart sounds and intact distal pulses.   No murmur heard. Respiratory: Effort normal and breath sounds normal. No respiratory distress. She has no wheezes. She has no rales. She exhibits no tenderness.  GI: Soft. Bowel sounds are normal. She exhibits no distension. There is no tenderness.  Musculoskeletal:       Left knee: She exhibits decreased range of motion, swelling and bony tenderness. She exhibits no effusion, no ecchymosis, no laceration, no erythema, no LCL laxity, normal patellar mobility and no MCL laxity. Tenderness found. Medial joint line tenderness noted.  Lymphadenopathy:    She has no cervical adenopathy.  Neurological: She is alert and oriented to person, place, and time. No cranial nerve deficit.  Skin: Skin is warm and dry. No rash noted. No erythema. No pallor.  Psychiatric: She has a normal mood and affect. Her behavior is normal.    Vital signs in last 24 hours: @VSRANGES @  Labs:   Estimated body mass index is 25.29 kg/(m^2) as calculated  from the following:   Height as of 09/18/13: 5\' 5"  (1.651 m).   Weight as of 09/18/13: 68.947 kg (152 lb).   Imaging Review Plain radiographs demonstrate severe degenerative joint disease of the left knee(s). The overall alignment ismild varus. The  bone quality appears to be good for age and reported activity level.  Assessment/Plan:  End stage arthritis, left knee   The patient history, physical examination, clinical judgment of the provider and imaging studies are consistent with end stage degenerative joint disease of the left knee(s) and total knee arthroplasty is deemed medically necessary. The treatment options including medical management, injection therapy arthroscopy and arthroplasty were discussed at length. The risks and benefits of total knee arthroplasty were presented and reviewed. The risks due to aseptic loosening, infection, stiffness, patella tracking problems, thromboembolic complications and other imponderables were discussed. The patient acknowledged the explanation, agreed to proceed with the plan and consent was signed. Patient is being admitted for inpatient treatment for surgery, pain control, PT, OT, prophylactic antibiotics, VTE prophylaxis, progressive ambulation and ADL's and discharge planning. The patient is planning to be discharged home with home health services.

## 2013-09-29 ENCOUNTER — Encounter (HOSPITAL_COMMUNITY): Payer: Self-pay | Admitting: Pharmacy Technician

## 2013-10-02 NOTE — Pre-Procedure Instructions (Signed)
Jasmine Ruiz  10/02/2013   Your procedure is scheduled on:  FRI, MAR 20 @ 7:30 AM  Report to [facility] Short Stay Entrance A  at 5:30 AM.  Call this number if you have problems the morning of surgery: [phone]   Remember:   Do not eat food or drink liquids after midnight.                No Goody's,BC's,Aleve,Aspirin,Ibuprofen,Fish Oil,or any Herbal Medications   Do not wear jewelry, make-up or nail polish.  Do not wear lotions, powders, or perfumes. You may wear deodorant.  Do not shave 48 hours prior to surgery.   Do not bring valuables to the hospital.  [facility] is not responsible                  for any belongings or valuables.               Contacts, dentures or bridgework may not be worn into surgery.  Leave suitcase in the car. After surgery it may be brought to your room.  For patients admitted to the hospital, discharge time is determined by your                treatment team.               Patients discharged the day of surgery will not be allowed to drive  home.    Special Instructions:  [facility] [facility] - Preparing for Surgery  Before surgery, you can play an important role.  Because skin is not sterile, your skin needs to be as free of germs as possible.  You can reduce the number of germs on you skin by washing with CHG (chlorahexidine gluconate) soap before surgery.  CHG is an antiseptic cleaner which kills germs and bonds with the skin to continue killing germs even after washing.  Please DO NOT use if you have an allergy to CHG or antibacterial soaps.  If your skin becomes reddened/irritated stop using the CHG and inform your nurse when you arrive at Short Stay.  Do not shave (including legs and underarms) for at least 48 hours prior to the first CHG shower.  You may shave your face.  Please follow these instructions carefully:   1.  Shower with CHG Soap the night before surgery and the                                morning of Surgery.  2.  If you choose to  wash your hair, wash your hair first as usual with your       normal shampoo.  3.  After you shampoo, rinse your hair and body thoroughly to remove the                      Shampoo.  4.  Use CHG as you would any other liquid soap.  You can apply chg directly       to the skin and wash gently with scrungie or a clean washcloth.  5.  Apply the CHG Soap to your body ONLY FROM THE NECK DOWN.        Do not use on open wounds or open sores.  Avoid contact with your eyes,       ears, mouth and genitals (private parts).  Wash genitals (private parts)       with your  normal soap.  6.  Wash thoroughly, paying special attention to the area where your surgery        will be performed.  7.  Thoroughly rinse your body with warm water from the neck down.  8.  DO NOT shower/wash with your normal soap after using and rinsing off       the CHG Soap.  9.  Pat yourself dry with a clean towel.            10.  Wear clean pajamas.            11.  Place clean sheets on your bed the night of your first shower and do not        sleep with pets.  Day of Surgery  Do not apply any lotions/deoderants the morning of surgery.  Please wear clean clothes to the hospital/surgery center.  [place] - Preparing for Surgery Day of Surgery  Do not apply any lotions/deoderants the morning of surgery.  Please wear clean clothes to the hospital/surgery center.     Please read over the following fact sheets that you were given: Pain Booklet, Coughing and Deep Breathing, Blood Transfusion Information, MRSA Information and Surgical Site Infection Prevention

## 2013-10-03 ENCOUNTER — Encounter (HOSPITAL_COMMUNITY)
Admission: RE | Admit: 2013-10-03 | Discharge: 2013-10-03 | Disposition: A | Discharge status: Home / Self Care | Payer: BC Managed Care – PPO | Source: Ambulatory Visit | Attending: Orthopedic Surgery | Admitting: Orthopedic Surgery

## 2013-10-03 ENCOUNTER — Encounter (HOSPITAL_COMMUNITY): Payer: Self-pay

## 2013-10-03 ENCOUNTER — Encounter (HOSPITAL_COMMUNITY)
Admission: RE | Admit: 2013-10-03 | Discharge: 2013-10-03 | Disposition: A | Discharge status: Home / Self Care | Payer: BC Managed Care – PPO | Source: Ambulatory Visit | Attending: Physician Assistant | Admitting: Physician Assistant

## 2013-10-03 DIAGNOSIS — Z01818 Encounter for other preprocedural examination: Secondary | ICD-10-CM | POA: Insufficient documentation

## 2013-10-03 DIAGNOSIS — Z01812 Encounter for preprocedural laboratory examination: Secondary | ICD-10-CM | POA: Insufficient documentation

## 2013-10-03 HISTORY — DX: Personal history of Methicillin resistant Staphylococcus aureus infection: Z86.14

## 2013-10-03 HISTORY — DX: Other specified postprocedural states: Z98.890

## 2013-10-03 HISTORY — DX: Personal history of other diseases of the respiratory system: Z87.09

## 2013-10-03 HISTORY — DX: Effusion, unspecified joint: M25.40

## 2013-10-03 HISTORY — DX: Enthesopathy, unspecified: M77.9

## 2013-10-03 HISTORY — DX: Insomnia, unspecified: G47.00

## 2013-10-03 HISTORY — DX: Other specified postprocedural states: R11.2

## 2013-10-03 HISTORY — DX: Plantar fascial fibromatosis: M72.2

## 2013-10-03 HISTORY — DX: Pain in unspecified joint: M25.50

## 2013-10-03 HISTORY — DX: Unspecified osteoarthritis, unspecified site: M19.90

## 2013-10-03 LAB — CBC WITH DIFFERENTIAL/PLATELET
BASOS PCT: 0 % (ref 0–1)
Basophils Absolute: 0 10*3/uL (ref 0.0–0.1)
EOS ABS: 0.1 10*3/uL (ref 0.0–0.7)
Eosinophils Relative: 1 % (ref 0–5)
HCT: 41.6 % (ref 36.0–46.0)
HEMOGLOBIN: 14.1 g/dL (ref 12.0–15.0)
LYMPHS ABS: 2.2 10*3/uL (ref 0.7–4.0)
Lymphocytes Relative: 31 % (ref 12–46)
MCH: 31.3 pg (ref 26.0–34.0)
MCHC: 33.9 g/dL (ref 30.0–36.0)
MCV: 92.4 fL (ref 78.0–100.0)
MONOS PCT: 12 % (ref 3–12)
Monocytes Absolute: 0.8 10*3/uL (ref 0.1–1.0)
NEUTROS ABS: 4 10*3/uL (ref 1.7–7.7)
Neutrophils Relative %: 56 % (ref 43–77)
PLATELETS: 284 10*3/uL (ref 150–400)
RBC: 4.5 MIL/uL (ref 3.87–5.11)
RDW: 12.7 % (ref 11.5–15.5)
WBC: 7.2 10*3/uL (ref 4.0–10.5)

## 2013-10-03 LAB — COMPREHENSIVE METABOLIC PANEL
ALK PHOS: 76 U/L (ref 39–117)
ALT: 22 U/L (ref 0–35)
AST: 24 U/L (ref 0–37)
Albumin: 3.7 g/dL (ref 3.5–5.2)
BILIRUBIN TOTAL: 0.3 mg/dL (ref 0.3–1.2)
BUN: 14 mg/dL (ref 6–23)
CHLORIDE: 103 meq/L (ref 96–112)
CO2: 27 mEq/L (ref 19–32)
Calcium: 9.7 mg/dL (ref 8.4–10.5)
Creatinine, Ser: 0.65 mg/dL (ref 0.50–1.10)
GFR calc Af Amer: >90 mL/min (ref >90)
GFR calc non Af Amer: >90 mL/min (ref >90)
Glucose, Bld: 86 mg/dL (ref 70–99)
Potassium: 4.4 mEq/L (ref 3.7–5.3)
SODIUM: 142 meq/L (ref 137–147)
Total Protein: 7 g/dL (ref 6.0–8.3)

## 2013-10-03 LAB — TYPE AND SCREEN
ABO/RH(D): O POS
ANTIBODY SCREEN: NEGATIVE

## 2013-10-03 LAB — URINALYSIS, ROUTINE W REFLEX MICROSCOPIC
Bilirubin Urine: NEGATIVE
Glucose, UA: NEGATIVE mg/dL
Hgb urine dipstick: NEGATIVE
Ketones, ur: NEGATIVE mg/dL
Leukocytes, UA: NEGATIVE
NITRITE: NEGATIVE
PH: 6 (ref 5.0–8.0)
Protein, ur: NEGATIVE mg/dL
SPECIFIC GRAVITY, URINE: 1.021 (ref 1.005–1.030)
Urobilinogen, UA: 0.2 mg/dL (ref 0.0–1.0)

## 2013-10-03 LAB — PROTIME-INR
INR: 1.01 (ref 0.00–1.49)
Prothrombin Time: 13.1 seconds (ref 11.6–15.2)

## 2013-10-03 LAB — SURGICAL PCR SCREEN
MRSA, PCR: NEGATIVE
Staphylococcus aureus: NEGATIVE

## 2013-10-03 LAB — APTT: APTT: 31 s (ref 24–37)

## 2013-10-03 LAB — ABO/RH: ABO/RH(D): O POS

## 2013-10-03 MED ORDER — CHLORHEXIDINE GLUCONATE 4 % EX LIQD: 60.0000 mL | Freq: Once | CUTANEOUS | Status: DC

## 2013-10-03 NOTE — Progress Notes (Addendum)
  Pt doesn't have a cardiologist  Denies ever having an echo/stress test/heart cath  EKG to be requested from Dr.Pickard  CXR in Jan 2015 with bronchitis and treated but no follow up xray was done    Medical Md is Dr.Warren Tanya NonesPickard

## 2013-10-04 LAB — URINE CULTURE
CULTURE: NO GROWTH
Colony Count: NO GROWTH

## 2013-10-05 NOTE — Progress Notes (Signed)
Spoke with Dr Jasmine Ruiz's office and they stated they will see if pt has an EKG and send it if they do.  They are part of [facility] but not in our records at this time.  Dr Tanya NonesPickard notes in his OV note that he had seen an EKG and wrote the results although no date noted with those results.

## 2013-10-12 MED ORDER — CEFAZOLIN SODIUM-DEXTROSE 2-3 GM-% IV SOLR
2.0000 g | INTRAVENOUS | Status: AC
  Administered 2013-10-13: 2 g via INTRAVENOUS
  Filled 2013-10-12: qty 50

## 2013-10-12 MED ORDER — SODIUM CHLORIDE 0.9 % IV SOLN: INTRAVENOUS | Status: DC

## 2013-10-12 MED ORDER — TRANEXAMIC ACID 100 MG/ML IV SOLN
1000.0000 mg | INTRAVENOUS | Status: AC
  Administered 2013-10-13: 1000 mg via INTRAVENOUS
  Filled 2013-10-12: qty 10

## 2013-10-13 ENCOUNTER — Inpatient Hospital Stay (HOSPITAL_COMMUNITY): Payer: BC Managed Care – PPO | Admitting: Anesthesiology

## 2013-10-13 ENCOUNTER — Encounter (HOSPITAL_COMMUNITY): Payer: BC Managed Care – PPO | Admitting: Anesthesiology

## 2013-10-13 ENCOUNTER — Inpatient Hospital Stay (HOSPITAL_COMMUNITY)
Admission: RE | Admit: 2013-10-13 | Discharge: 2013-10-15 | DRG: 470 | Disposition: A | Discharge status: Home Health | Payer: BC Managed Care – PPO | Source: Ambulatory Visit | Attending: Orthopedic Surgery | Admitting: Orthopedic Surgery

## 2013-10-13 ENCOUNTER — Encounter (HOSPITAL_COMMUNITY)
Admission: RE | Disposition: A | Discharge status: Home Health | Payer: Self-pay | Source: Ambulatory Visit | Attending: Orthopedic Surgery

## 2013-10-13 ENCOUNTER — Encounter (HOSPITAL_COMMUNITY): Payer: Self-pay | Admitting: Surgery

## 2013-10-13 DIAGNOSIS — M1710 Unilateral primary osteoarthritis, unspecified knee: Principal | ICD-10-CM | POA: Diagnosis present

## 2013-10-13 DIAGNOSIS — G43909 Migraine, unspecified, not intractable, without status migrainosus: Secondary | ICD-10-CM | POA: Diagnosis present

## 2013-10-13 DIAGNOSIS — M722 Plantar fascial fibromatosis: Secondary | ICD-10-CM | POA: Diagnosis present

## 2013-10-13 DIAGNOSIS — Z888 Allergy status to other drugs, medicaments and biological substances status: Secondary | ICD-10-CM

## 2013-10-13 DIAGNOSIS — G4700 Insomnia, unspecified: Secondary | ICD-10-CM | POA: Diagnosis present

## 2013-10-13 DIAGNOSIS — Z8614 Personal history of Methicillin resistant Staphylococcus aureus infection: Secondary | ICD-10-CM | POA: Diagnosis present

## 2013-10-13 DIAGNOSIS — Z9889 Other specified postprocedural states: Secondary | ICD-10-CM | POA: Diagnosis present

## 2013-10-13 DIAGNOSIS — R112 Nausea with vomiting, unspecified: Secondary | ICD-10-CM | POA: Diagnosis present

## 2013-10-13 DIAGNOSIS — M1712 Unilateral primary osteoarthritis, left knee: Secondary | ICD-10-CM | POA: Diagnosis present

## 2013-10-13 HISTORY — PX: TOTAL KNEE ARTHROPLASTY: SHX125

## 2013-10-13 LAB — CBC
HEMATOCRIT: 37.5 % (ref 36.0–46.0)
Hemoglobin: 12.8 g/dL (ref 12.0–15.0)
MCH: 31.2 pg (ref 26.0–34.0)
MCHC: 34.1 g/dL (ref 30.0–36.0)
MCV: 91.5 fL (ref 78.0–100.0)
Platelets: 257 10*3/uL (ref 150–400)
RBC: 4.1 MIL/uL (ref 3.87–5.11)
RDW: 12.6 % (ref 11.5–15.5)
WBC: 16.3 10*3/uL — ABNORMAL HIGH (ref 4.0–10.5)

## 2013-10-13 LAB — CREATININE, SERUM
Creatinine, Ser: 0.58 mg/dL (ref 0.50–1.10)
GFR calc Af Amer: >90 mL/min (ref >90)
GFR calc non Af Amer: >90 mL/min (ref >90)

## 2013-10-13 SURGERY — ARTHROPLASTY, KNEE, TOTAL
Anesthesia: General | Site: Knee | Laterality: Left

## 2013-10-13 MED ORDER — PHENYLEPHRINE HCL 10 MG/ML IJ SOLN
INTRAMUSCULAR | Status: DC | PRN
  Administered 2013-10-13: 80 ug via INTRAVENOUS
  Administered 2013-10-13 (×4): 40 ug via INTRAVENOUS
  Administered 2013-10-13: 80 ug via INTRAVENOUS
  Administered 2013-10-13: 40 ug via INTRAVENOUS

## 2013-10-13 MED ORDER — HYDROMORPHONE HCL PF 1 MG/ML IJ SOLN
INTRAMUSCULAR | Status: AC
  Filled 2013-10-13: qty 1

## 2013-10-13 MED ORDER — ONDANSETRON HCL 4 MG/2ML IJ SOLN
INTRAMUSCULAR | Status: DC | PRN
  Administered 2013-10-13: 4 mg via INTRAVENOUS

## 2013-10-13 MED ORDER — PROPOFOL 10 MG/ML IV BOLUS
INTRAVENOUS | Status: AC
  Filled 2013-10-13: qty 20

## 2013-10-13 MED ORDER — ZOLPIDEM TARTRATE 5 MG PO TABS: 5.0000 mg | ORAL_TABLET | Freq: Every evening | ORAL | Status: DC | PRN

## 2013-10-13 MED ORDER — MENTHOL 3 MG MT LOZG: 1.0000 | LOZENGE | OROMUCOSAL | Status: DC | PRN

## 2013-10-13 MED ORDER — SODIUM CHLORIDE 0.9 % IV SOLN
INTRAVENOUS | Status: DC
  Administered 2013-10-13 – 2013-10-14 (×2): via INTRAVENOUS

## 2013-10-13 MED ORDER — FENTANYL CITRATE 0.05 MG/ML IJ SOLN
INTRAMUSCULAR | Status: AC
  Filled 2013-10-13: qty 5

## 2013-10-13 MED ORDER — ACETAMINOPHEN 325 MG PO TABS: 650.0000 mg | ORAL_TABLET | Freq: Four times a day (QID) | ORAL | Status: DC | PRN

## 2013-10-13 MED ORDER — DOCUSATE SODIUM 100 MG PO CAPS
100.0000 mg | ORAL_CAPSULE | Freq: Two times a day (BID) | ORAL | Status: DC
  Administered 2013-10-13 – 2013-10-15 (×4): 100 mg via ORAL
  Filled 2013-10-13 (×4): qty 1

## 2013-10-13 MED ORDER — FENTANYL CITRATE 0.05 MG/ML IJ SOLN
INTRAMUSCULAR | Status: DC | PRN
  Administered 2013-10-13: 100 ug via INTRAVENOUS
  Administered 2013-10-13 (×2): 50 ug via INTRAVENOUS
  Administered 2013-10-13: 100 ug via INTRAVENOUS
  Administered 2013-10-13: 50 ug via INTRAVENOUS

## 2013-10-13 MED ORDER — ONDANSETRON HCL 4 MG/2ML IJ SOLN
INTRAMUSCULAR | Status: AC
  Filled 2013-10-13: qty 2

## 2013-10-13 MED ORDER — HYDROMORPHONE HCL PF 1 MG/ML IJ SOLN: 0.2500 mg | INTRAMUSCULAR | Status: DC | PRN

## 2013-10-13 MED ORDER — ONDANSETRON HCL 4 MG PO TABS
4.0000 mg | ORAL_TABLET | Freq: Four times a day (QID) | ORAL | Status: DC | PRN
  Administered 2013-10-15: 4 mg via ORAL
  Filled 2013-10-13: qty 1

## 2013-10-13 MED ORDER — ACETAMINOPHEN 650 MG RE SUPP: 650.0000 mg | Freq: Four times a day (QID) | RECTAL | Status: DC | PRN

## 2013-10-13 MED ORDER — BISACODYL 10 MG RE SUPP: 10.0000 mg | Freq: Every day | RECTAL | Status: DC | PRN

## 2013-10-13 MED ORDER — METOCLOPRAMIDE HCL 5 MG PO TABS
5.0000 mg | ORAL_TABLET | Freq: Three times a day (TID) | ORAL | Status: DC | PRN
  Filled 2013-10-13: qty 2

## 2013-10-13 MED ORDER — CEFAZOLIN SODIUM 1-5 GM-% IV SOLN
1.0000 g | Freq: Four times a day (QID) | INTRAVENOUS | Status: AC
  Administered 2013-10-13 (×2): 1 g via INTRAVENOUS
  Filled 2013-10-13 (×4): qty 50

## 2013-10-13 MED ORDER — PROMETHAZINE HCL 25 MG/ML IJ SOLN: 6.2500 mg | INTRAMUSCULAR | Status: DC | PRN

## 2013-10-13 MED ORDER — SODIUM CHLORIDE 0.9 % IR SOLN
Status: DC | PRN
  Administered 2013-10-13: 3000 mL

## 2013-10-13 MED ORDER — ELETRIPTAN HYDROBROMIDE 40 MG PO TABS
40.0000 mg | ORAL_TABLET | ORAL | Status: DC | PRN
  Administered 2013-10-15: 40 mg via ORAL
  Filled 2013-10-13 (×2): qty 1

## 2013-10-13 MED ORDER — ENOXAPARIN SODIUM 30 MG/0.3ML ~~LOC~~ SOLN: 30.0000 mg | Freq: Two times a day (BID) | SUBCUTANEOUS | Status: DC

## 2013-10-13 MED ORDER — DIPHENHYDRAMINE HCL 12.5 MG/5ML PO ELIX: 12.5000 mg | ORAL_SOLUTION | ORAL | Status: DC | PRN

## 2013-10-13 MED ORDER — ENOXAPARIN SODIUM 30 MG/0.3ML ~~LOC~~ SOLN
30.0000 mg | Freq: Two times a day (BID) | SUBCUTANEOUS | Status: DC
  Administered 2013-10-14 – 2013-10-15 (×3): 30 mg via SUBCUTANEOUS
  Filled 2013-10-13 (×5): qty 0.3

## 2013-10-13 MED ORDER — HYDROMORPHONE HCL 2 MG PO TABS: ORAL_TABLET | ORAL | Status: DC

## 2013-10-13 MED ORDER — FLEET ENEMA 7-19 GM/118ML RE ENEM: 1.0000 | ENEMA | Freq: Once | RECTAL | Status: AC | PRN

## 2013-10-13 MED ORDER — PHENOL 1.4 % MT LIQD: 1.0000 | OROMUCOSAL | Status: DC | PRN

## 2013-10-13 MED ORDER — BUPIVACAINE-EPINEPHRINE PF 0.5-1:200000 % IJ SOLN
INTRAMUSCULAR | Status: DC | PRN
  Administered 2013-10-13: 150 mg

## 2013-10-13 MED ORDER — HYDROMORPHONE HCL 2 MG PO TABS
2.0000 mg | ORAL_TABLET | ORAL | Status: DC | PRN
  Administered 2013-10-13 – 2013-10-15 (×9): 2 mg via ORAL
  Filled 2013-10-13 (×9): qty 1

## 2013-10-13 MED ORDER — PROPOFOL 10 MG/ML IV BOLUS
INTRAVENOUS | Status: DC | PRN
  Administered 2013-10-13: 200 mg via INTRAVENOUS

## 2013-10-13 MED ORDER — LACTATED RINGERS IV SOLN
INTRAVENOUS | Status: DC | PRN
  Administered 2013-10-13 (×2): via INTRAVENOUS

## 2013-10-13 MED ORDER — METHOCARBAMOL 500 MG PO TABS
500.0000 mg | ORAL_TABLET | Freq: Four times a day (QID) | ORAL | Status: DC | PRN
  Administered 2013-10-13 – 2013-10-15 (×5): 500 mg via ORAL
  Filled 2013-10-13 (×5): qty 1

## 2013-10-13 MED ORDER — HYDROMORPHONE HCL PF 1 MG/ML IJ SOLN
1.0000 mg | INTRAMUSCULAR | Status: DC | PRN
  Administered 2013-10-13: 1 mg via INTRAVENOUS
  Filled 2013-10-13: qty 1

## 2013-10-13 MED ORDER — MIDAZOLAM HCL 2 MG/2ML IJ SOLN
INTRAMUSCULAR | Status: AC
  Filled 2013-10-13: qty 2

## 2013-10-13 MED ORDER — 0.9 % SODIUM CHLORIDE (POUR BTL) OPTIME
TOPICAL | Status: DC | PRN
  Administered 2013-10-13: 1000 mL

## 2013-10-13 MED ORDER — METHOCARBAMOL 100 MG/ML IJ SOLN
500.0000 mg | Freq: Four times a day (QID) | INTRAVENOUS | Status: DC | PRN
  Filled 2013-10-13: qty 5

## 2013-10-13 MED ORDER — SENNOSIDES-DOCUSATE SODIUM 8.6-50 MG PO TABS: 1.0000 | ORAL_TABLET | Freq: Every evening | ORAL | Status: DC | PRN

## 2013-10-13 MED ORDER — METHOCARBAMOL 500 MG PO TABS: 500.0000 mg | ORAL_TABLET | Freq: Four times a day (QID) | ORAL | Status: DC | PRN

## 2013-10-13 MED ORDER — ONDANSETRON HCL 4 MG/2ML IJ SOLN
4.0000 mg | Freq: Four times a day (QID) | INTRAMUSCULAR | Status: DC | PRN
  Administered 2013-10-13: 4 mg via INTRAVENOUS
  Filled 2013-10-13: qty 2

## 2013-10-13 MED ORDER — MIDAZOLAM HCL 5 MG/5ML IJ SOLN
INTRAMUSCULAR | Status: DC | PRN
  Administered 2013-10-13: 2 mg via INTRAVENOUS

## 2013-10-13 MED ORDER — METOCLOPRAMIDE HCL 5 MG/ML IJ SOLN
5.0000 mg | Freq: Three times a day (TID) | INTRAMUSCULAR | Status: DC | PRN
  Administered 2013-10-13: 10 mg via INTRAVENOUS
  Filled 2013-10-13: qty 2

## 2013-10-13 MED ORDER — DEXAMETHASONE SODIUM PHOSPHATE 10 MG/ML IJ SOLN
INTRAMUSCULAR | Status: DC | PRN
  Administered 2013-10-13: 6 mg via INTRAVENOUS

## 2013-10-13 SURGICAL SUPPLY — 66 items
BANDAGE ELASTIC 4 VELCRO ST LF (GAUZE/BANDAGES/DRESSINGS) ×3 IMPLANT
BANDAGE ELASTIC 6 VELCRO ST LF (GAUZE/BANDAGES/DRESSINGS) ×3 IMPLANT
BANDAGE ESMARK 6X9 LF (GAUZE/BANDAGES/DRESSINGS) ×1 IMPLANT
BLADE SAGITTAL 25.0X1.19X90 (BLADE) ×2 IMPLANT
BLADE SAGITTAL 25.0X1.19X90MM (BLADE) ×1
BLADE SAW SAG 90X13X1.27 (BLADE) ×3 IMPLANT
BLADE SURG 10 STRL SS (BLADE) ×3 IMPLANT
BNDG ESMARK 6X9 LF (GAUZE/BANDAGES/DRESSINGS) ×3
BOWL SMART MIX CTS (DISPOSABLE) ×3 IMPLANT
CAPT RP KNEE ×3 IMPLANT
CEMENT HV SMART SET (Cement) ×6 IMPLANT
COVER SURGICAL LIGHT HANDLE (MISCELLANEOUS) ×3 IMPLANT
CUFF TOURNIQUET SINGLE 34IN LL (TOURNIQUET CUFF) ×3 IMPLANT
CUFF TOURNIQUET SINGLE 44IN (TOURNIQUET CUFF) IMPLANT
DRAPE INCISE IOBAN 66X45 STRL (DRAPES) IMPLANT
DRAPE ORTHO SPLIT 77X108 STRL (DRAPES) ×4
DRAPE SURG ORHT 6 SPLT 77X108 (DRAPES) ×2 IMPLANT
DRAPE U-SHAPE 47X51 STRL (DRAPES) ×3 IMPLANT
DRSG ADAPTIC 3X8 NADH LF (GAUZE/BANDAGES/DRESSINGS) ×3 IMPLANT
DRSG PAD ABDOMINAL 8X10 ST (GAUZE/BANDAGES/DRESSINGS) ×3 IMPLANT
DURAPREP 26ML APPLICATOR (WOUND CARE) ×3 IMPLANT
ELECT CAUTERY BLADE 6.4 (BLADE) ×3 IMPLANT
ELECT REM PT RETURN 9FT ADLT (ELECTROSURGICAL) ×3
ELECTRODE REM PT RTRN 9FT ADLT (ELECTROSURGICAL) ×1 IMPLANT
EVACUATOR 1/8 PVC DRAIN (DRAIN) ×3 IMPLANT
FACESHIELD LNG OPTICON STERILE (SAFETY) ×6 IMPLANT
FLOSEAL 10ML (HEMOSTASIS) IMPLANT
GLOVE BIOGEL PI IND STRL 8 (GLOVE) ×4 IMPLANT
GLOVE BIOGEL PI INDICATOR 8 (GLOVE) ×8
GLOVE ORTHO TXT STRL SZ7.5 (GLOVE) ×9 IMPLANT
GLOVE SURG ORTHO 8.0 STRL STRW (GLOVE) ×9 IMPLANT
GOWN STRL REUS W/ TWL LRG LVL3 (GOWN DISPOSABLE) ×2 IMPLANT
GOWN STRL REUS W/ TWL XL LVL3 (GOWN DISPOSABLE) ×1 IMPLANT
GOWN STRL REUS W/TWL 2XL LVL3 (GOWN DISPOSABLE) ×3 IMPLANT
GOWN STRL REUS W/TWL LRG LVL3 (GOWN DISPOSABLE) ×4
GOWN STRL REUS W/TWL XL LVL3 (GOWN DISPOSABLE) ×2
HANDPIECE INTERPULSE COAX TIP (DISPOSABLE) ×2
HOOD PEEL AWAY FACE SHEILD DIS (HOOD) ×3 IMPLANT
IMMOBILIZER KNEE 22 (SOFTGOODS) ×3 IMPLANT
IMMOBILIZER KNEE 22 UNIV (SOFTGOODS) IMPLANT
KIT BASIN OR (CUSTOM PROCEDURE TRAY) ×3 IMPLANT
KIT ROOM TURNOVER OR (KITS) ×3 IMPLANT
MANIFOLD NEPTUNE II (INSTRUMENTS) ×3 IMPLANT
NEEDLE 22X1 1/2 (OR ONLY) (NEEDLE) IMPLANT
NS IRRIG 1000ML POUR BTL (IV SOLUTION) ×3 IMPLANT
PACK TOTAL JOINT (CUSTOM PROCEDURE TRAY) ×3 IMPLANT
PAD ARMBOARD 7.5X6 YLW CONV (MISCELLANEOUS) ×6 IMPLANT
PAD CAST 4YDX4 CTTN HI CHSV (CAST SUPPLIES) ×1 IMPLANT
PADDING CAST COTTON 4X4 STRL (CAST SUPPLIES) ×2
PADDING CAST COTTON 6X4 STRL (CAST SUPPLIES) ×3 IMPLANT
SET HNDPC FAN SPRY TIP SCT (DISPOSABLE) ×1 IMPLANT
SPONGE GAUZE 4X4 12PLY (GAUZE/BANDAGES/DRESSINGS) ×3 IMPLANT
STAPLER VISISTAT 35W (STAPLE) ×3 IMPLANT
SUCTION FRAZIER TIP 10 FR DISP (SUCTIONS) ×3 IMPLANT
SUT ETHIBOND NAB CT1 #1 30IN (SUTURE) ×3 IMPLANT
SUT VIC AB 0 CT1 27 (SUTURE) ×4
SUT VIC AB 0 CT1 27XBRD ANBCTR (SUTURE) ×2 IMPLANT
SUT VIC AB 1 CT1 27 (SUTURE) ×4
SUT VIC AB 1 CT1 27XBRD ANBCTR (SUTURE) ×2 IMPLANT
SUT VIC AB 2-0 CT1 27 (SUTURE) ×4
SUT VIC AB 2-0 CT1 TAPERPNT 27 (SUTURE) ×2 IMPLANT
SYR CONTROL 10ML LL (SYRINGE) IMPLANT
TOWEL OR 17X24 6PK STRL BLUE (TOWEL DISPOSABLE) ×3 IMPLANT
TOWEL OR 17X26 10 PK STRL BLUE (TOWEL DISPOSABLE) ×3 IMPLANT
TRAY FOLEY CATH 16FRSI W/METER (SET/KITS/TRAYS/PACK) IMPLANT
WATER STERILE IRR 1000ML POUR (IV SOLUTION) IMPLANT

## 2013-10-13 NOTE — Anesthesia Preprocedure Evaluation (Addendum)
Anesthesia Evaluation  Patient identified by MRN, date of birth, ID band Patient awake    Reviewed: Allergy & Precautions, H&P , NPO status , Patient's Chart, lab work & pertinent test results  History of Anesthesia Complications (+) PONV and history of anesthetic complications  Airway Mallampati: II TM Distance: >3 FB Neck ROM: Full    Dental  (+) Teeth Intact, Dental Advisory Given   Pulmonary neg pulmonary ROS,    Pulmonary exam normal       Cardiovascular negative cardio ROS      Neuro/Psych  Headaches, negative psych ROS   GI/Hepatic negative GI ROS, Neg liver ROS,   Endo/Other  negative endocrine ROS  Renal/GU negative Renal ROS     Musculoskeletal   Abdominal   Peds  Hematology   Anesthesia Other Findings   Reproductive/Obstetrics                          Anesthesia Physical Anesthesia Plan  ASA: III  Anesthesia Plan: General   Post-op Pain Management:    Induction: Intravenous  Airway Management Planned: LMA  Additional Equipment:   Intra-op Plan:   Post-operative Plan: Extubation in OR  Informed Consent: I have reviewed the patients History and Physical, chart, labs and discussed the procedure including the risks, benefits and alternatives for the proposed anesthesia with the patient or authorized representative who has indicated his/her understanding and acceptance.   Dental advisory given  Plan Discussed with: CRNA, Anesthesiologist and Surgeon  Anesthesia Plan Comments:         Anesthesia Quick Evaluation

## 2013-10-13 NOTE — Evaluation (Signed)
Physical Therapy Evaluation Patient Details Name: Lawernce IonJayne B Eccleston MRN: [id number] DOB: [birth year] Today's Date: 10/13/2013 Time: [id number] PT Time Calculation (min): 21 min  PT Assessment / Plan / Recommendation History of Present Illness  s/p Lt TKA   Clinical Impression  Pt is s/p Lt TKA resulting in the deficits listed below (see PT Problem List). Pt will benefit from skilled PT to increase their independence and safety with mobility to allow discharge to the venue listed below. Pt limited during evaluation to standing due to nausea/vomiting. Pt highly motivated. Anticipate pt will progress quickly when nausea/vomiting subsides.      PT Assessment  Patient needs continued PT services    Follow Up Recommendations  Home health PT;Supervision/Assistance - 24 hour    Does the patient have the potential to tolerate intense rehabilitation      Barriers to Discharge        Equipment Recommendations  None recommended by PT;Other (comment) (pt reports she has 3 in1 and RW)    Recommendations for Other Services OT consult   Frequency 7X/week    Precautions / Restrictions Precautions Precautions: Fall;Knee Precaution Booklet Issued: Yes (comment) Precaution Comments: given TKA exercise handout  Required Braces or Orthoses: Knee Immobilizer - Left Knee Immobilizer - Left: On when out of bed or walking Restrictions Weight Bearing Restrictions: Yes LLE Weight Bearing: Weight bearing as tolerated   Pertinent Vitals/Pain C/o nausea; no c/o pain       Mobility  Bed Mobility Overal bed mobility: Needs Assistance Bed Mobility: Supine to Sit;Sit to Supine Supine to sit: Min assist;HOB elevated Sit to supine: Min assist General bed mobility comments: (A) to advance Lt LE to/off EOB; cues for hand placement and sequencing; pt also required (A) to advance Lt LE onto bed to return to supine; requires incr time due to nausea  Transfers Overall transfer level: Needs assistance Equipment  used: Rolling walker (2 wheeled) Transfers: Sit to/from Stand Sit to Stand: Min assist General transfer comment: performed sit to stand with min (A) to maintain balance and for cues for sequencing; pt limited ability to WB through Lt LE due to buckling; returned to seated position after 30 seconds  Ambulation/Gait General Gait Details: will assess when numbness and nausea has resolved    Exercises Total Joint Exercises Ankle Circles/Pumps: AROM;Both;10 reps;Supine   PT Diagnosis: Difficulty walking;Generalized weakness;Acute pain  PT Problem List: Decreased strength;Decreased range of motion;Decreased activity tolerance;Decreased balance;Decreased mobility;Decreased knowledge of use of DME;Decreased safety awareness;Pain;Impaired sensation PT Treatment Interventions: DME instruction;Gait training;Stair training;Therapeutic activities;Functional mobility training;Therapeutic exercise;Balance training;Neuromuscular re-education;Patient/family education     PT Goals(Current goals can be found in the care plan section) Acute Rehab PT Goals Patient Stated Goal: to not be sick PT Goal Formulation: With patient Time For Goal Achievement: 10/27/13 Potential to Achieve Goals: Good  Visit Information  Last PT Received On: 10/13/13 Assistance Needed: +1 History of Present Illness: s/p Lt TKA        Prior Functioning  Home Living Family/patient expects to be discharged to:: Private residence Living Arrangements: Spouse/significant other Available Help at Discharge: Family;Available 24 hours/day Type of Home: House Home Access: Stairs to enter Entergy CorporationEntrance Stairs-Number of Steps: 5 Entrance Stairs-Rails: Right Home Layout: One level Home Equipment: Walker - 2 wheels;Bedside commode;Cane - quad;Crutches;Shower seat Additional Comments: reports she has all equipment from her parents; pt has tub shower   Prior Function Level of Independence: Independent Comments: pt is a retired Engineer, siteschool teacher  but reports she is very active  Communication Communication: No difficulties    Cognition  Cognition Arousal/Alertness: Awake/alert Behavior During Therapy: WFL for tasks assessed/performed Overall Cognitive Status: Within Functional Limits for tasks assessed    Extremity/Trunk Assessment Upper Extremity Assessment Upper Extremity Assessment: Defer to OT evaluation Lower Extremity Assessment Lower Extremity Assessment: LLE deficits/detail LLE Deficits / Details: limited by surgery/ACE wrap; grossly 3-/5 in knee  LLE Sensation: decreased light touch (c/o nubmness ) LLE Coordination: decreased gross motor Cervical / Trunk Assessment Cervical / Trunk Assessment: Normal   Balance Balance Overall balance assessment: Needs assistance Sitting-balance support: No upper extremity supported;Feet supported Sitting balance-Leahy Scale: Fair Standing balance support: During functional activity;Bilateral upper extremity supported Standing balance-Leahy Scale: Poor Standing balance comment: tolerated standing 30 seconds; limited by Lt LE buckling and nausea  General Comments General comments (skin integrity, edema, etc.): pt vomited clear fluids twice - RN notified   End of Session PT - End of Session Equipment Utilized During Treatment: Gait belt;Left knee immobilizer Activity Tolerance: Other (comment) (limited by nausea) Patient left: in bed;with call bell/phone within reach;in CPM Nurse Communication: Mobility status;Precautions;Weight bearing status CPM Left Knee CPM Left Knee: On Left Knee Flexion (Degrees): 60 Left Knee Extension (Degrees): 0 Additional Comments: educated pt on how to turn machine on/off  GP     Shelva Majestic [place], [place] [phone] 10/13/2013, 3:46 PM

## 2013-10-13 NOTE — Discharge Instructions (Signed)
Diet: As you were doing prior to hospitalization  ° °Activity: Increase activity slowly as tolerated  °No lifting or driving for 6 weeks  ° °Shower: May shower without a dressing on post op day #5, NO SOAKING in tub  ° °Dressing: You may change your dressing on post op day #3.  °Then change the dressing daily with sterile 4"x4"s gauze dressing  °And TED hose for knees. Use paper tape to hold dressing in place  °For hips. You may clean the incision with alcohol prior to redressing.  ° °Weight Bearing: weight bearing as taught in physical therapy. Use a walker or  °Crutches as instructed.  ° °To prevent constipation: you may use a stool softener such as -  °Colace ( over the counter) 100 mg by mouth twice a day  °Drink plenty of fluids ( prune juice may be helpful) and high fiber foods  °Miralax ( over the counter) for constipation as needed.  ° °Precautions: If you experience chest pain or shortness of breath - call 911 immediately For transfer to the hospital emergency department!!  °If you develop a fever greater that 101 F, purulent drainage from wound, increased redness or drainage from wound, or calf pain -- Call the office  ° °Follow- Up Appointment: Please call for an appointment to be seen in 2 weeks  °[place] - [phone] ° °

## 2013-10-13 NOTE — Progress Notes (Signed)
Utilization review completed.  

## 2013-10-13 NOTE — Op Note (Signed)
NAME:  Jasmine Ruiz, Jasmine Ruiz                ACCOUNT NO.:  [id number]  MEDICAL RECORD NO.:  [id number]  LOCATION:  [facility]                         FACILITY:  [facility]  PHYSICIAN:  Dyke BrackettW. D. Garry Nicolini, M.D.    DATE OF BIRTH:  February 28, 1956  DATE OF PROCEDURE:  10/13/2013 DATE OF DISCHARGE:                              OPERATIVE REPORT   INDICATIONS:  A 58 year old with intractable left knee pain with end- stage arthritis, thought to be amenable to hospitalization, replacement.  PREOPERATIVE DIAGNOSIS:  Osteoarthritis, left knee, with varus deformity.  POSTOPERATIVE DIAGNOSIS:  Osteoarthritis, left knee, with varus deformity.  OPERATION:  Left total knee replacement (DePuy Sigma knee, size 3 femur, tibia 12.5-mm bearing, 35-mm all-poly patella).  SURGEON:  Dyke BrackettW. D. Nazifa Trinka, MD  ASSISTANT:  Margart SicklesJoshua Chadwell, PA-C  ANESTHESIA:  General with a femoral nerve block.  TOURNIQUET TIME:  60 minutes.  DESCRIPTION OF PROCEDURE:  The patient in supine position, sterile prep and drape, Esmarch exsanguination on the leg, inflation to 350. Straight skin incision was made with medial parapatellar approach made to the knee.  We cut 11 mm of distal femur with 5-degree valgus cut, followed by 2 to 3 mm below the most diseased medial compartment, extension gap measured 12.5 mm in size.  The femur to be sized 3 followed by placement of all-in-one cutting block for the anterior- posterior chamfer cuts.  We then did a clean out in the posterior aspect of the knee, release of the PCL and the flexion gap, next the extension gap at 12.5 mm with good balancing.  Keel hole was cut for the tibial base plate, followed by the box cut for the femur.  We then trialed the femur and tibia with full extension noted with 12.5-mm bearing, no tendency for bearing.  Spin out was noted as well.  Patella was cut, leaving about 15 to 16 mm of native patella for 35-mm all-poly patella.  Trials were placed.  Again, all parameters were checked,  deemed to be acceptable.  We then cemented in the components the tibia followed by femur and patella.  We elected to use a trial bearing.  Once the cement was hardened, the trial bearing was removed.  Excess cement was removed.  We released the tourniquet.  No excessive bleeding was noted in the posterior aspect of the knee.  Closure was affected after Hemovac drain was placed superolaterally with #1 Ethibond on the capsule, 0 and 2-0 Vicryl, and skin clips on the skin.  Lightly compressive sterile dressing and knee immobilizer applied, taken to the recovery room in stable condition.     Dyke BrackettW. D. Tehillah Cipriani, M.D.     WDC/MEDQ  D:  10/13/2013  T:  10/13/2013  Job:  [id number]

## 2013-10-13 NOTE — Plan of Care (Signed)
Problem: Consults Goal: Diagnosis- Total Joint Replacement Primary Total Knee Left     

## 2013-10-13 NOTE — Brief Op Note (Signed)
10/13/2013  9:41 AM  PATIENT:  Lawernce IonJayne B Amburn  58 y.o. female  PRE-OPERATIVE DIAGNOSIS:  OA LEFT KNEE  POST-OPERATIVE DIAGNOSIS:  OA LEFT KNEE  PROCEDURE:  Procedure(s): TOTAL KNEE ARTHROPLASTY (Left)  SURGEON:  Surgeon(s) and Role:    * W D Carloyn Manneraffrey Jr., MD - Primary  PHYSICIAN ASSISTANT: Margart SicklesJoshua Lacy Taglieri, PA-C  ASSISTANTS:   ANESTHESIA:   regional and general  EBL:  Total I/O In: 1500 [I.V.:1500] Out: -   BLOOD ADMINISTERED:none  DRAINS: 1 hemovac drain lateral left knee self suction   LOCAL MEDICATIONS USED:  NONE  SPECIMEN:  No Specimen  DISPOSITION OF SPECIMEN:  N/A  COUNTS:  YES  TOURNIQUET:   Total Tourniquet Time Documented: Thigh (Left) - 62 minutes Total: Thigh (Left) - 62 minutes   DICTATION: .Other Dictation: Dictation Number unknown  PLAN OF CARE: Admit to inpatient   PATIENT DISPOSITION:  PACU - hemodynamically stable.   Delay start of Pharmacological VTE agent (>24hrs) due to surgical blood loss or risk of bleeding: yes

## 2013-10-13 NOTE — Progress Notes (Signed)
Orthopedic Tech Progress Note Patient Details:  Jasmine Ruiz [birth year] [id number] CPM applied to Left LE with appropriate settings. OHF applied to bed.  CPM Left Knee CPM Left Knee: On Left Knee Flexion (Degrees): 60 Left Knee Extension (Degrees): 0   Asia R Thompson 10/13/2013, 10:30 AM

## 2013-10-13 NOTE — Anesthesia Postprocedure Evaluation (Signed)
Anesthesia Post Note  Patient: Jasmine Ruiz  Procedure(s) Performed: Procedure(s) (LRB): TOTAL KNEE ARTHROPLASTY (Left)  Anesthesia type: general  Patient location: PACU  Post pain: Pain level controlled  Post assessment: Patient's Cardiovascular Status Stable  Last Vitals:  Filed Vitals:   10/13/13 1030  BP: 136/69  Pulse: 84  Temp:   Resp: 19    Post vital signs: Reviewed and stable  Level of consciousness: sedated  Complications: No apparent anesthesia complications

## 2013-10-13 NOTE — H&P (View-Only) (Signed)
TOTAL KNEE ADMISSION H&P  Patient is being admitted for left total knee arthroplasty.  Subjective:  Chief Complaint:left knee pain.  HPI: Jasmine Ruiz, 58 y.o. female, has a history of pain and functional disability in the left knee due to arthritis and has failed non-surgical conservative treatments for greater than 12 weeks to includeNSAID's and/or analgesics, corticosteriod injections, viscosupplementation injections, flexibility and strengthening excercises, use of assistive devices and activity modification.  Onset of symptoms was gradual, starting 5 years ago with gradually worsening course since that time. The patient noted prior procedures on the knee to include  arthroscopy and menisectomy on the left knee(s).  Patient currently rates pain in the left knee(s) at 8 out of 10 with activity. Patient has night pain, worsening of pain with activity and weight bearing, pain that interferes with activities of daily living, pain with passive range of motion, crepitus and joint swelling.  Patient has evidence of periarticular osteophytes and joint space narrowing by imaging studies.There is no active infection.  There are no active problems to display for this patient.  Past Medical History  Diagnosis Date  . Migraine     Past Surgical History  Procedure Laterality Date  . Mouth surgery    . Abdominal hysterectomy       (Not in a hospital admission) Allergies  Allergen Reactions  . Sulfa Antibiotics     unknown  . Vicodin [Hydrocodone-Acetaminophen] Nausea And Vomiting    History  Substance Use Topics  . Smoking status: Never Smoker   . Smokeless tobacco: Not on file  . Alcohol Use: No    No family history on file.   Review of Systems  Constitutional: Negative.   HENT: Negative for congestion, ear discharge, ear pain, hearing loss, nosebleeds, sore throat and tinnitus.   Eyes: Negative.   Respiratory: Negative.  Negative for stridor.   Cardiovascular: Negative.    Gastrointestinal: Negative.   Genitourinary: Negative.   Musculoskeletal: Positive for joint pain. Negative for back pain, falls, myalgias and neck pain.  Skin: Negative.   Neurological: Positive for headaches.  Endo/Heme/Allergies: Negative.   Psychiatric/Behavioral: Negative.     Objective:  Physical Exam  Constitutional: She is oriented to person, place, and time. She appears well-developed and well-nourished. No distress.  HENT:  Head: Normocephalic and atraumatic.  Nose: Nose normal.  Eyes: Conjunctivae and EOM are normal. Pupils are equal, round, and reactive to light.  Neck: Normal range of motion. Neck supple.  Cardiovascular: Normal rate, regular rhythm, normal heart sounds and intact distal pulses.   No murmur heard. Respiratory: Effort normal and breath sounds normal. No respiratory distress. She has no wheezes. She has no rales. She exhibits no tenderness.  GI: Soft. Bowel sounds are normal. She exhibits no distension. There is no tenderness.  Musculoskeletal:       Left knee: She exhibits decreased range of motion, swelling and bony tenderness. She exhibits no effusion, no ecchymosis, no laceration, no erythema, no LCL laxity, normal patellar mobility and no MCL laxity. Tenderness found. Medial joint line tenderness noted.  Lymphadenopathy:    She has no cervical adenopathy.  Neurological: She is alert and oriented to person, place, and time. No cranial nerve deficit.  Skin: Skin is warm and dry. No rash noted. No erythema. No pallor.  Psychiatric: She has a normal mood and affect. Her behavior is normal.    Vital signs in last 24 hours: @VSRANGES @  Labs:   Estimated body mass index is 25.29 kg/(m^2) as calculated  from the following:   Height as of 09/18/13: 5\' 5"  (1.651 m).   Weight as of 09/18/13: 68.947 kg (152 lb).   Imaging Review Plain radiographs demonstrate severe degenerative joint disease of the left knee(s). The overall alignment ismild varus. The  bone quality appears to be good for age and reported activity level.  Assessment/Plan:  End stage arthritis, left knee   The patient history, physical examination, clinical judgment of the provider and imaging studies are consistent with end stage degenerative joint disease of the left knee(s) and total knee arthroplasty is deemed medically necessary. The treatment options including medical management, injection therapy arthroscopy and arthroplasty were discussed at length. The risks and benefits of total knee arthroplasty were presented and reviewed. The risks due to aseptic loosening, infection, stiffness, patella tracking problems, thromboembolic complications and other imponderables were discussed. The patient acknowledged the explanation, agreed to proceed with the plan and consent was signed. Patient is being admitted for inpatient treatment for surgery, pain control, PT, OT, prophylactic antibiotics, VTE prophylaxis, progressive ambulation and ADL's and discharge planning. The patient is planning to be discharged home with home health services.

## 2013-10-13 NOTE — Interval H&P Note (Signed)
History and Physical Interval Note:  10/13/2013 7:23 AM  Jasmine Ruiz  has presented today for surgery, with the diagnosis of OA LEFT KNEE  The various methods of treatment have been discussed with the patient and family. After consideration of risks, benefits and other options for treatment, the patient has consented to  Procedure(s): TOTAL KNEE ARTHROPLASTY (Left) as a surgical intervention .  The patient's history has been reviewed, patient examined, no change in status, stable for surgery.  I have reviewed the patient's chart and labs.  Questions were answered to the patient's satisfaction.     Rosine Solecki JR,W D

## 2013-10-13 NOTE — Transfer of Care (Signed)
Immediate Anesthesia Transfer of Care Note  Patient: Jasmine Ruiz  Procedure(s) Performed: Procedure(s): TOTAL KNEE ARTHROPLASTY (Left)  Patient Location: PACU  Anesthesia Type:General and Regional  Level of Consciousness: awake, alert  and oriented  Airway & Oxygen Therapy: Patient Spontanous Breathing and Patient connected to nasal cannula oxygen  Post-op Assessment: VSS  Post vital signs: Reviewed and stable  Complications: No apparent anesthesia complications

## 2013-10-13 NOTE — Anesthesia Procedure Notes (Addendum)
Anesthesia Regional Block:  Femoral nerve block  Pre-Anesthetic Checklist: ,, timeout performed, Correct Patient, Correct Site, Correct Laterality, Correct Procedure, Correct Position, site marked, Risks and benefits discussed,  Surgical consent,  Pre-op evaluation,  At surgeon's request and post-op pain management  Laterality: Left  Prep: chloraprep       Needles:  Injection technique: Single-shot  Needle Type: Echogenic Stimulator Needle     Needle Length: 5cm 5 cm Needle Gauge: 22 and 22 G    Additional Needles:  Procedures: ultrasound guided (picture in chart) and nerve stimulator Femoral nerve block  Nerve Stimulator or Paresthesia:  Response: quadraceps contraction, 0.45 mA,   Additional Responses:   Narrative:  Start time: 10/13/2013 7:14 AM End time: 10/13/2013 7:24 AM Injection made incrementally with aspirations every 5 mL.  Performed by: Personally  Anesthesiologist: Halford DecampJ. Daniel Lucindy Borel, MD  Additional Notes: Functioning IV was confirmed and monitors were applied.  A 50mm 22ga Arrow echogenic stimulator needle was used. Sterile prep and drape,hand hygiene and sterile gloves were used. Ultrasound guidance: relevant anatomy identified, needle position confirmed, local anesthetic spread visualized around nerve(s)., vascular puncture avoided.  Image printed for medical record. Negative aspiration and negative test dose prior to incremental administration of local anesthetic. The patient tolerated the procedure well.     Procedure Name: LMA Insertion Date/Time: 10/13/2013 7:35 AM Performed by: Arlice ColtMANESS, CARRIE B Pre-anesthesia Checklist: Emergency Drugs available, Patient identified, Suction available, Patient being monitored and Timeout performed Patient Re-evaluated:Patient Re-evaluated prior to inductionOxygen Delivery Method: Circle system utilized Preoxygenation: Pre-oxygenation with 100% oxygen Intubation Type: IV induction LMA: LMA inserted LMA Size: 4.0 Number  of attempts: 1 Placement Confirmation: positive ETCO2 and breath sounds checked- equal and bilateral Tube secured with: Tape Dental Injury: Teeth and Oropharynx as per pre-operative assessment     Anesthesia Procedure Note

## 2013-10-14 DIAGNOSIS — R112 Nausea with vomiting, unspecified: Secondary | ICD-10-CM | POA: Diagnosis present

## 2013-10-14 DIAGNOSIS — Z9889 Other specified postprocedural states: Secondary | ICD-10-CM

## 2013-10-14 DIAGNOSIS — Z8614 Personal history of Methicillin resistant Staphylococcus aureus infection: Secondary | ICD-10-CM | POA: Diagnosis present

## 2013-10-14 LAB — CBC
HCT: 33.2 % — ABNORMAL LOW (ref 36.0–46.0)
Hemoglobin: 11.2 g/dL — ABNORMAL LOW (ref 12.0–15.0)
MCH: 31.2 pg (ref 26.0–34.0)
MCHC: 33.7 g/dL (ref 30.0–36.0)
MCV: 92.5 fL (ref 78.0–100.0)
PLATELETS: 249 10*3/uL (ref 150–400)
RBC: 3.59 MIL/uL — AB (ref 3.87–5.11)
RDW: 13 % (ref 11.5–15.5)
WBC: 14.4 10*3/uL — ABNORMAL HIGH (ref 4.0–10.5)

## 2013-10-14 LAB — BASIC METABOLIC PANEL
BUN: 12 mg/dL (ref 6–23)
CALCIUM: 8.7 mg/dL (ref 8.4–10.5)
CO2: 25 meq/L (ref 19–32)
Chloride: 104 mEq/L (ref 96–112)
Creatinine, Ser: 0.63 mg/dL (ref 0.50–1.10)
GFR calc Af Amer: >90 mL/min (ref >90)
Glucose, Bld: 89 mg/dL (ref 70–99)
Potassium: 3.6 mEq/L — ABNORMAL LOW (ref 3.7–5.3)
Sodium: 141 mEq/L (ref 137–147)

## 2013-10-14 NOTE — Progress Notes (Signed)
Orthopedic Tech Progress Note Patient Details:  Jasmine Ruiz [birth year] [id number] FOOT ROLL     Cammer, Mickie BailJennifer Carol 10/14/2013, 10:51 AM

## 2013-10-14 NOTE — Progress Notes (Signed)
Physical Therapy Treatment Patient Details Name: Jasmine Ruiz MRN: [id number] DOB: 1955-08-20 Today's Date: 10/14/2013 Time: [id number] PT Time Calculation (min): 34 min  PT Assessment / Plan / Recommendation  History of Present Illness s/p Lt TKA    PT Comments   Pt making great progress with mobility. In recliner on arrival bathing, instructed in technique for lower extremity bathing while seated to complete bath prior to exercises. Deferred shower education to OT evaluation/treatments as needed.   Follow Up Recommendations  Home health PT;Supervision/Assistance - 24 hour     Equipment Recommendations  None recommended by PT    Recommendations for Other Services OT consult  Frequency 7X/week   Progress towards PT Goals Progress towards PT goals: Progressing toward goals  Plan Current plan remains appropriate    Precautions / Restrictions Precautions Precautions: Fall;Knee Required Braces or Orthoses: Knee Immobilizer - Left Knee Immobilizer - Left: On when out of bed or walking Restrictions LLE Weight Bearing: Weight bearing as tolerated       Mobility  Bed Mobility General bed mobility comments: pt in recliner before and after session Transfers Overall transfer level: Needs assistance Equipment used: Rolling walker (2 wheeled) Transfers: Sit to/from Stand Sit to Stand: Min guard General transfer comment: cues for hand and left LE placement with transfers. Repeated transfer x3 for strengthening, instruction and to complete bath. Ambulation/Gait Ambulation/Gait assistance: Min guard;Supervision Ambulation Distance (Feet): 200 Feet Assistive device: Rolling walker (2 wheeled) Gait Pattern/deviations: Step-to pattern;Step-through pattern;Antalgic Gait velocity: decreased Gait velocity interpretation: Below normal speed for age/gender General Gait Details: progressed from step to pattern to a step through pattern with less assistance needed. cues intitially for walker  use/position with gait.    Exercises Total Joint Exercises Ankle Circles/Pumps: AROM;Both;10 reps;Seated Quad Sets: AROM;Strengthening;Both;10 reps;Seated Heel Slides: AAROM;Strengthening;Left;10 reps;Seated;AROM Hip ABduction/ADduction: Strengthening;Left;10 reps;Seated;AROM Straight Leg Raises: Strengthening;Left;10 reps;Seated;AROM Long [employer] Quad: AROM;Strengthening;Left;10 reps;Seated Goniometric ROM: seated in recliner: left knee AROM 80 Degrees flexion    PT Goals (current goals can now be found in the care plan section) Acute Rehab PT Goals Patient Stated Goal: to not be sick PT Goal Formulation: With patient Time For Goal Achievement: 10/27/13 Potential to Achieve Goals: Good  Visit Information  Last PT Received On: 10/14/13 Assistance Needed: +1 History of Present Illness: s/p Lt TKA     Subjective Data  Patient Stated Goal: to not be sick   Cognition  Cognition Arousal/Alertness: Awake/alert Behavior During Therapy: WFL for tasks assessed/performed Overall Cognitive Status: Within Functional Limits for tasks assessed       End of Session PT - End of Session Equipment Utilized During Treatment: Gait belt;Left knee immobilizer Activity Tolerance: Patient tolerated treatment well Patient left: in chair;with call bell/phone within reach Nurse Communication: Mobility status CPM Left Knee CPM Left Knee: On Additional Comments: Foot roll   GP     Sallyanne KusterBury, Kathy 10/14/2013, 1:26 PM  Sallyanne KusterKathy Bury, PTA Office- [phone]

## 2013-10-14 NOTE — Progress Notes (Signed)
Subjective: 1 Day Post-Op Procedure(s) (LRB): TOTAL KNEE ARTHROPLASTY (Left) Patient reports pain as 4 on 0-10 scale.    Objective: Vital signs in last 24 hours: Temp:  [97.8 F (36.6 C)-98.4 F (36.9 C)] 97.8 F (36.6 C) (03/21 0603) Pulse Rate:  [80-108] 108 (03/21 0603) Resp:  [16-19] 16 (03/21 0603) BP: (98-136)/(42-73) 108/50 mmHg (03/21 0603) SpO2:  [96 %-100 %] 100 % (03/21 0603)  Intake/Output from previous day: 03/20 0701 - 03/21 0700 In: 3577.5 [P.O.:840; I.V.:2737.5] Out: 1260 [Urine:1000; Drains:260] Intake/Output this shift:     Recent Labs  10/13/13 1417 10/14/13 0625  HGB 12.8 11.2*    Recent Labs  10/13/13 1417 10/14/13 0625  WBC 16.3* 14.4*  RBC 4.10 3.59*  HCT 37.5 33.2*  PLT 257 249    Recent Labs  10/13/13 1417 10/14/13 0625  NA  --  141  K  --  3.6*  CL  --  104  CO2  --  25  BUN  --  12  CREATININE 0.58 0.63  GLUCOSE  --  89  CALCIUM  --  8.7   No results found for this basename: LABPT, INR,  in the last 72 hours  Neurologically intact ABD soft Neurovascular intact Sensation intact distally Intact pulses distally Dorsiflexion/Plantar flexion intact Incision: dressing C/D/I  Assessment/Plan: 1 Day Post-Op Procedure(s) (LRB): TOTAL KNEE ARTHROPLASTY (Left) Advance diet Up with therapy D/C IV fluids Plan for discharge tomorrow  Pascal LuxSHEPPERSON,[place]armonee Tozer J 10/14/2013, 9:47 AM

## 2013-10-14 NOTE — Care Management Note (Signed)
CARE MANAGEMENT NOTE 10/14/2013  Patient:  Jasmine Ruiz,Jasmine Ruiz   Account Number:  [id number]  Date Initiated:  10/13/2013  Documentation initiated by:  Vance PeperBRADY,Tynslee Bowlds  Subjective/Objective Assessment:   58 yr old female s/p left total knee arthroplasty.     Action/Plan:   PT/OT eval  Case Manager spoke with patient concerning home health and DME needs at discharge. Choice offered. CM faxed referral to [facility]. Patient has rolling walker and 3in1, needs CPM.   Anticipated DC Date:  10/14/2013   Anticipated DC Plan:  HOME W HOME HEALTH SERVICES      DC Planning Services  CM consult      [facility]  DURABLE MEDICAL EQUIPMENT   Choice offered to / List presented to:  C-1 Patient   DME arranged  CPM      DME agency  [employer]     HH arranged  HH-2 PT      HH agency  [facility]   Status of service:  Completed, signed off Medicare Important Message given?   (If response is "NO", the following Medicare IM given date fields will be blank) Date Medicare IM given:   Date Additional Medicare IM given:    Discharge Disposition:  HOME W HOME HEALTH SERVICES

## 2013-10-14 NOTE — Progress Notes (Signed)
Physical Therapy Treatment Patient Details Name: Jasmine Ruiz MRN: [id number] DOB: 1955-11-17 Today's Date: 10/14/2013 Time: 1540-1550 PT Time Calculation (min): 10 min  PT Assessment / Plan / Recommendation  History of Present Illness s/p Lt TKA    PT Comments   Limited by pain this pm. RN aware, pt premedicated about an hour ago. Ice applied to knee for pain relief. Pt agreeable to out of bed with nursing later today as pain improves. Exercises performed with incr quad activation this pm vs her am session today.   Follow Up Recommendations  Home health PT;Supervision/Assistance - 24 hour     Equipment Recommendations  None recommended by PT    Recommendations for Other Services OT consult  Frequency 7X/week   Progress towards PT Goals Progress towards PT goals: Progressing toward goals  Plan Current plan remains appropriate    Precautions / Restrictions Precautions Precautions: Fall;Knee Required Braces or Orthoses: Knee Immobilizer - Left Knee Immobilizer - Left: On when out of bed or walking Restrictions LLE Weight Bearing: Weight bearing as tolerated       Mobility  Bed Mobility General bed mobility comments: pt in recliner before and after session Transfers Overall transfer level: Needs assistance Equipment used: Rolling walker (2 wheeled) Transfers: Sit to/from Stand Sit to Stand: Min guard General transfer comment: cues for hand and left LE placement with transfers. Repeated transfer x3 for strengthening, instruction and to complete bath. Ambulation/Gait Ambulation/Gait assistance: Min guard;Supervision Ambulation Distance (Feet): 200 Feet Assistive device: Rolling walker (2 wheeled) Gait Pattern/deviations: Step-to pattern;Step-through pattern;Antalgic Gait velocity: decreased Gait velocity interpretation: Below normal speed for age/gender General Gait Details: progressed from step to pattern to a step through pattern with less assistance needed. cues  intitially for walker use/position with gait.    Exercises Total Joint Exercises Ankle Circles/Pumps: AROM;Both;10 reps;Supine Quad Sets: AROM;Strengthening;Left;10 reps;Supine Heel Slides: AAROM;Strengthening;Left;10 reps;Supine Hip ABduction/ADduction: AAROM;Strengthening;Left;10 reps;Supine Straight Leg Raises: AAROM;Strengthening;Left;10 reps;Supine Long Arc Quad: AROM;Strengthening;Left;10 reps;Seated Goniometric ROM: seated in recliner: left knee AROM 80 Degrees flexion      PT Goals (current goals can now be found in the care plan section) Acute Rehab PT Goals Patient Stated Goal: to not be sick PT Goal Formulation: With patient Time For Goal Achievement: 10/27/13 Potential to Achieve Goals: Good  Visit Information  Last PT Received On: 10/14/13 Assistance Needed: +1 History of Present Illness: s/p Lt TKA     Subjective Data  Patient Stated Goal: to not be sick   Cognition  Cognition Arousal/Alertness: Awake/alert Behavior During Therapy: WFL for tasks assessed/performed Overall Cognitive Status: Within Functional Limits for tasks assessed       End of Session PT - End of Session Equipment Utilized During Treatment: Gait belt Activity Tolerance: Patient limited by pain Patient left: in bed;with call bell/phone within reach Nurse Communication: Mobility status;Patient requests pain meds CPM Left Knee CPM Left Knee: On   GP     Sallyanne KusterBury, Jasmine Ruiz 10/14/2013, 3:51 PM  Sallyanne KusterKathy Keena Ruiz, PTA Office- [phone]

## 2013-10-15 LAB — CBC
HCT: 32.5 % — ABNORMAL LOW (ref 36.0–46.0)
Hemoglobin: 10.9 g/dL — ABNORMAL LOW (ref 12.0–15.0)
MCH: 31 pg (ref 26.0–34.0)
MCHC: 33.5 g/dL (ref 30.0–36.0)
MCV: 92.3 fL (ref 78.0–100.0)
Platelets: 221 10*3/uL (ref 150–400)
RBC: 3.52 MIL/uL — AB (ref 3.87–5.11)
RDW: 13 % (ref 11.5–15.5)
WBC: 12.5 10*3/uL — AB (ref 4.0–10.5)

## 2013-10-15 NOTE — Progress Notes (Signed)
   CARE MANAGEMENT NOTE 10/15/2013  Patient:  Jasmine Ruiz,Jasmine Ruiz   Account Number:  [id number]  Date Initiated:  10/13/2013  Documentation initiated by:  Vance PeperBRADY,SUSAN  Subjective/Objective Assessment:   58 yr old female s/p left total knee arthroplasty.     Action/Plan:   PT/OT eval  Case Manager spoke with patient concerning home health and DME needs at discharge. Choice offered. CM faxed referral to [facility]. Patient has rolling walker and 3in1, needs CPM.   Anticipated DC Date:  10/14/2013   Anticipated DC Plan:  HOME W HOME HEALTH SERVICES      DC Planning Services  CM consult      [facility]  DURABLE MEDICAL EQUIPMENT   Choice offered to / List presented to:  C-1 Patient   DME arranged  CPM      DME agency  [employer]     HH arranged  HH-2 PT      HH agency  [facility]   Status of service:  Completed, signed off Medicare Important Message given?   (If response is "NO", the following Medicare IM given date fields will be blank) Date Medicare IM given:   Date Additional Medicare IM given:    Discharge Disposition:  HOME W HOME HEALTH SERVICES  Per UR Regulation:    If discussed at Long Length of Stay Meetings, dates discussed:    Comments:  10/15/13 09:20 [employer] rep called to request 3n1 and [facility] cannot get out to room prior to discharge.  CM called DME [facility] rep who states he will be happy to deliver 3n1 and rolling walker to room prior to discharge.  [employer] will meet with pt in home for CPM needs 10/16/13.  No other CM needs were communicated.  Jasmine JakschSarah Jalisia Ruiz, BSN, CM [phone].

## 2013-10-15 NOTE — Progress Notes (Signed)
Physical Therapy Treatment Patient Details Name: Jasmine Ruiz MRN: [id number] DOB: 1956-05-07 Today's Date: 10/15/2013 Time: [id number] PT Time Calculation (min): 24 min  PT Assessment / Plan / Recommendation  History of Present Illness s/p Lt TKA    PT Comments   Continuing progress with functional mobility; Stair training complete;  OK for dc from PT standpoint  Follow Up Recommendations  Home health PT;Supervision/Assistance - 24 hour     Does the patient have the potential to tolerate intense rehabilitation     Barriers to Discharge        Equipment Recommendations  None recommended by PT    Recommendations for Other Services    Frequency 7X/week   Progress towards PT Goals Progress towards PT goals: Progressing toward goals  Plan Current plan remains appropriate    Precautions / Restrictions Precautions Precautions: Fall;Knee Precaution Booklet Issued: Yes (comment) Required Braces or Orthoses: Knee Immobilizer - Left Knee Immobilizer - Left: On when out of bed or walking Restrictions Weight Bearing Restrictions: Yes LLE Weight Bearing: Weight bearing as tolerated   Pertinent Vitals/Pain 3-5/10 knee pain patient repositioned for comfort and Optimal knee extension     Mobility  Transfers Overall transfer level: Needs assistance Equipment used: Rolling walker (2 wheeled) Transfers: Sit to/from Stand Sit to Stand: Supervision General transfer comment: Cues for safety Ambulation/Gait Ambulation/Gait assistance: Supervision Ambulation Distance (Feet): 300 Feet Assistive device: Rolling walker (2 wheeled) Gait Pattern/deviations: Step-through pattern Gait velocity: decreased General Gait Details: progressed from step to pattern to a step through pattern with less assistance needed. cues intitially for walker use/position with gait. Stairs: Yes Stairs assistance: Min guard Stair Management: One rail Right;Step to pattern;Sideways Number of Stairs: 5 General  stair comments: Cues for sequence, hand placement, and technique; Managed steps quite well    Exercises     PT Diagnosis:    PT Problem List:   PT Treatment Interventions:     PT Goals (current goals can now be found in the care plan section) Acute Rehab PT Goals Patient Stated Goal: to not be sick PT Goal Formulation: With patient Time For Goal Achievement: 10/27/13 Potential to Achieve Goals: Good  Visit Information  Last PT Received On: 10/15/13 Assistance Needed: +1 History of Present Illness: s/p Lt TKA     Subjective Data  Subjective: Feels confident to go home Patient Stated Goal: to not be sick   Cognition  Cognition Arousal/Alertness: Awake/alert Behavior During Therapy: WFL for tasks assessed/performed Overall Cognitive Status: Within Functional Limits for tasks assessed    Balance     End of Session PT - End of Session Activity Tolerance: Patient tolerated treatment well Patient left: in chair;with call bell/phone within reach Nurse Communication: Mobility status   GP     Van ClinesGarrigan, Ben Habermann Hamff 10/15/2013, 11:50 AM Van ClinesHolly Carlethia Mesquita, PT  Acute Rehabilitation Services Pager [phone] Office [phone]

## 2013-10-15 NOTE — Progress Notes (Signed)
Patient discharged to home with family after written and verbal d/c instructions reviewed. Both deny any questions, prescriptions received, equipment taken home with patient, to be seen in am with CPM delivery per case manager.

## 2013-10-15 NOTE — Progress Notes (Signed)
OT Cancellation Note  Patient Details Name: Jasmine Ruiz MRN: [id number] DOB: Aug 31, 1955   Cancelled Treatment:    Reason Eval/Treat Not Completed: OT screened, no needs identified, will sign off - spoke with pt who does not feel she needs OT at this time.  Will sign off.  Angelene GiovanniConarpe, Gaetana Kawahara M Silverio Hagan [place], OTR/L [phone] 10/15/2013, 12:09 PM

## 2013-10-15 NOTE — Discharge Summary (Signed)
Patient ID: Jasmine Ruiz MRN: [id number] DOB/AGE: 12-17-1955 58 y.o.  Admit date: 10/13/2013 Discharge date: 10/15/2013  Admission Diagnoses:  Principal Problem:   Osteoarthritis of left knee Active Problems:   PONV (postoperative nausea and vomiting)   History of MRSA infection   Discharge Diagnoses:  Same  Past Medical History  Diagnosis Date  . Insomnia     takes Ambien nightly as needed  . PONV (postoperative nausea and vomiting)   . History of bronchitis 07/2013  . Migraine     takes Relpax daily as needed;last migraine end of Feb 2015  . Arthritis   . Joint pain   . Plantar fasciitis   . Tendonitis     left should  . Joint swelling   . History of MRSA infection     Surgeries: Procedure(s): TOTAL KNEE ARTHROPLASTY on 10/13/2013   Consultants:    Discharged Condition: Improved  Hospital Course: Jasmine Ruiz is an 58 y.o. female who was admitted 10/13/2013 for operative treatment ofOsteoarthritis of left knee. Patient has severe unremitting pain that affects sleep, daily activities, and work/hobbies. After pre-op clearance the patient was taken to the operating room on 10/13/2013 and underwent  Procedure(s): TOTAL KNEE ARTHROPLASTY.    Patient was given perioperative antibiotics: Anti-infectives   Start     Dose/Rate Route Frequency Ordered Stop   10/13/13 1200  ceFAZolin (ANCEF) IVPB 1 g/50 mL premix     1 g 100 mL/hr over 30 Minutes Intravenous Every 6 hours 10/13/13 1111 10/13/13 1834   10/13/13 0600  ceFAZolin (ANCEF) IVPB 2 g/50 mL premix     2 g 100 mL/hr over 30 Minutes Intravenous On call to O.R. 10/12/13 1421 10/13/13 0740       Patient was given sequential compression devices, early ambulation, and chemoprophylaxis to prevent DVT.  Patient benefited maximally from hospital stay and there were no complications.    Recent vital signs: Patient Vitals for the past 24 hrs:  BP Temp Temp src Pulse Resp SpO2  10/14/13 1247 117/54 mmHg 99.2 F (37.3  C) Oral 103 16 100 %     Recent laboratory studies:  Recent Labs  10/13/13 1417 10/14/13 0625 10/15/13 0645  WBC 16.3* 14.4* 12.5*  HGB 12.8 11.2* 10.9*  HCT 37.5 33.2* 32.5*  PLT 257 249 221  NA  --  141  --   K  --  3.6*  --   CL  --  104  --   CO2  --  25  --   BUN  --  12  --   CREATININE 0.58 0.63  --   GLUCOSE  --  89  --   CALCIUM  --  8.7  --      Discharge Medications:     Medication List         eletriptan 40 MG tablet  Commonly known as:  RELPAX  Take 1 tablet (40 mg total) by mouth as needed. may repeat in 2 hours if necessary for migraine     enoxaparin 30 MG/0.3ML injection  Commonly known as:  LOVENOX  Inject 0.3 mLs (30 mg total) into the skin every 12 (twelve) hours.     HYDROmorphone 2 MG tablet  Commonly known as:  DILAUDID  1-2 tabs po q4hrs prn pain     methocarbamol 500 MG tablet  Commonly known as:  ROBAXIN  Take 1 tablet (500 mg total) by mouth 4 (four) times daily as needed for muscle spasms.  zolpidem 10 MG tablet  Commonly known as:  AMBIEN  Take 10 mg by mouth at bedtime as needed for sleep.        Diagnostic Studies: Dg Chest 2 View  10/03/2013   CLINICAL DATA:  Preop knee surgery  EXAM: CHEST  2 VIEW  COMPARISON:  None.  FINDINGS: The heart size and mediastinal contours are within normal limits. Both lungs are clear. The visualized skeletal structures are unremarkable. Mild apical scarring bilaterally.  IMPRESSION: No active cardiopulmonary disease.   Electronically Signed   By: Marlan Palauharles  Clark M.D.   On: 10/03/2013 11:21    Disposition: 01-Home or Self Care      Discharge Orders   Future Orders Complete By Expires   Call MD / Call 911  As directed    Comments:     If you experience chest pain or shortness of breath, CALL 911 and be transported to the hospital emergency room.  If you develope a fever above 101 F, pus (white drainage) or increased drainage or redness at the wound, or calf pain, call your surgeon's office.    Change dressing  As directed    Comments:     Change the dressing daily with sterile 4 x 4 inch gauze dressing and apply TED hose.  You may clean the incision with alcohol prior to redressing.   Constipation Prevention  As directed    Comments:     Drink plenty of fluids.  Prune juice may be helpful.  You may use a stool softener, such as Colace (over the counter) 100 mg twice a day.  Use MiraLax (over the counter) for constipation as needed.   CPM  As directed    Comments:     Continuous passive motion machine (CPM):      Use the CPM from 0 to 90 for 6 hours per day.       You may break it up into 2 or 3 sessions per day.      Use CPM for 2 weeks or until you are told to stop.   Diet - low sodium heart healthy  As directed    Discharge instructions  As directed    Comments:     Total Knee Replacement Care After Refer to this sheet in the next few weeks. These discharge instructions provide you with general information on caring for yourself after you leave the hospital. Your caregiver may also give you specific instructions. Your treatment has been planned according to the most current medical practices available, but unavoidable complications sometimes occur. If you have any problems or questions after discharge, please call your caregiver. Regaining a near full range of motion of your knee within the first 3 to 6 weeks after surgery is critical. HOME CARE INSTRUCTIONS  You may resume a normal diet and activities as directed.  Perform exercises as directed.  Place gray foam block, curve side up under heel at all times except when in CPM or when walking.  DO NOT modify, tear, cut, or change in any way the gray foam block. You will receive physical therapy daily  Take showers instead of baths until informed otherwise.  You may shower on Sunday.  Please wash whole leg including wound with soap and water  Change bandages (dressings)daily It is OK to take over-the-counter tylenol in addition  to the oxycodone for pain, discomfort, or fever. Oxycodone is VERY constipating.  Please take stool softener twice a day and laxatives daily until bowels are  regular Eat a well-balanced diet.  Avoid lifting or driving until you are instructed otherwise.  Make an appointment to see your caregiver for stitches (suture) or staple removal as directed.  If you have been sent home with a continuous passive motion machine (CPM machine), 0-90 degrees 6 hrs a day   2 hrs a shift SEEK MEDICAL CARE IF: You have swelling of your calf or leg.  You develop shortness of breath or chest pain.  You have redness, swelling, or increasing pain in the wound.  There is pus or any unusual drainage coming from the surgical site.  You notice a bad smell coming from the surgical site or dressing.  The surgical site breaks open after sutures or staples have been removed.  There is persistent bleeding from the suture or staple line.  You are getting worse or are not improving.  You have any other questions or concerns.  SEEK IMMEDIATE MEDICAL CARE IF:  You have a fever.  You develop a rash.  You have difficulty breathing.  You develop any reaction or side effects to medicines given.  Your knee motion is decreasing rather than improving.  MAKE SURE YOU:  Understand these instructions.  Will watch your condition.  Will get help right away if you are not doing well or get worse.   Do not put a pillow under the knee. Place it under the heel.  As directed    Comments:     Place gray foam block, curve side up under heel at all times except when in CPM or when walking.  DO NOT modify, tear, cut, or change in any way the gray foam block.   Increase activity slowly as tolerated  As directed    TED hose  As directed    Comments:     Use stockings (TED hose) for 2 weeks on both leg(s).  You may remove them at night for sleeping.      Follow-up Information   Follow up with [facility]. (Someone from  [facility] will contact you regarding start date and time for physical therapy.)    Contact information:   [address] [place] [address] [phone]       Follow up with CAFFREY JR,W D, MD On 10/26/2013. (appt time  2 pm)    Specialty:  Orthopedic Surgery   Contact information:   [address] [place] [address] [phone]        Signed: Pascal Lux 10/15/2013, 10:44 AM

## 2013-10-15 NOTE — Progress Notes (Signed)
Case management phoned for need of cpm machine prior to Monday, [facility]

## 2013-10-17 ENCOUNTER — Encounter (HOSPITAL_COMMUNITY): Payer: Self-pay | Admitting: Orthopedic Surgery

## 2013-10-17 ENCOUNTER — Encounter: Payer: Self-pay | Admitting: Family Medicine

## 2013-11-01 ENCOUNTER — Ambulatory Visit (HOSPITAL_COMMUNITY)
Admission: RE | Admit: 2013-11-01 | Discharge: 2013-11-01 | Disposition: A | Discharge status: Home / Self Care | Payer: BC Managed Care – PPO | Source: Ambulatory Visit | Attending: Family Medicine | Admitting: Family Medicine

## 2013-11-01 DIAGNOSIS — R262 Difficulty in walking, not elsewhere classified: Secondary | ICD-10-CM | POA: Insufficient documentation

## 2013-11-01 DIAGNOSIS — M25669 Stiffness of unspecified knee, not elsewhere classified: Secondary | ICD-10-CM | POA: Insufficient documentation

## 2013-11-01 DIAGNOSIS — IMO0001 Reserved for inherently not codable concepts without codable children: Secondary | ICD-10-CM | POA: Insufficient documentation

## 2013-11-01 DIAGNOSIS — M25569 Pain in unspecified knee: Secondary | ICD-10-CM | POA: Insufficient documentation

## 2013-11-01 DIAGNOSIS — Z96659 Presence of unspecified artificial knee joint: Secondary | ICD-10-CM | POA: Insufficient documentation

## 2013-11-01 DIAGNOSIS — M6281 Muscle weakness (generalized): Secondary | ICD-10-CM | POA: Insufficient documentation

## 2013-11-01 NOTE — Evaluation (Signed)
Physical Therapy Evaluation  Patient Details  Name: Jasmine Ruiz MRN: [id number] Date of Birth: 02/09/1956  Today's Date: 11/01/2013 Time: 4098-1191 PT Time Calculation (min): 45 min    Charges: 1 Evaluation, 1530-1545 Therex           Visit#: 1 of 24  Re-eval: 12/01/13 Assessment Diagnosis: Difficulty walking secodnary to knee pain, weakness s/p knee replacement.  Surgical Date: 10/13/13  Authorization: [employer]    Authorization Time Period:    Authorization Visit#: 1 of     Past Medical History:  Past Medical History  Diagnosis Date  . Insomnia     takes Ambien nightly as needed  . PONV (postoperative nausea and vomiting)   . History of bronchitis 07/2013  . Migraine     takes Relpax daily as needed;last migraine end of Feb 2015  . Arthritis   . Joint pain   . Plantar fasciitis   . Tendonitis     left should  . Joint swelling   . History of MRSA infection    Past Surgical History:  Past Surgical History  Procedure Laterality Date  . Mouth surgery    . Knee arthroscopy Left 2014  . Abdominal hysterectomy    . Exploratory laparotomy    . Total knee arthroplasty Left 10/13/2013    Procedure: TOTAL KNEE ARTHROPLASTY;  Surgeon: Thera Flake., MD;  Location: [facility] OR;  Service: Orthopedics;  Laterality: Left;    Subjective Symptoms/Limitations Symptoms: history of knee pain for 30 years, No pain at rest, pain worst in the morning secondary to stifness.  Pertinent History: February 2014  arthroscopic surgery, then worsend months later, resulting in current knee replacement 10-13-13.  How long can you sit comfortably?: 1 hour How long can you walk comfortably?: Pain Assessment Currently in Pain?: Yes Pain Score: 1  Pain Location: Knee Pain Orientation: Left Pain Type: Surgical pain Pain Frequency: Intermittent Pain Relieving Factors: rest, movemnt,  Effect of Pain on Daily Activities: pai/stiffness with promonged sitting. unable to walk 2 miles a day. Zumba,    Precautions/Restrictions  Restrictions LLE Weight Bearing: Weight bearing as tolerated  Cognition/Observation Cognition Overall Cognitive Status: Within Functional Limits for tasks assessed Observation/Other Assessments Observations: Patient ambulates with decreased weight shift to L LE  and increased knee valgus on Rt knee, patient displays good ankle and appropriate ankle mobility though she does have increased pronation. Patient also displays Lt LE limited IR, and ER on Lt compared to Rt.  Other Assessments: 3D hip excursions: Limited  weigt shift to Lt, Limited hip extension, Limited Lt hip IR   Sensation/Coordination/Flexibility/Functional Tests Sensation Light Touch: Appears Intact Stereognosis: Appears Intact Hot/Cold: Appears Intact Proprioception: Appears Intact Coordination Gross Motor Movements are Fluid and Coordinated: Yes Fine Motor Movements are Fluid and Coordinated: Yes Flexibility Thomas: Positive  Assessment RLE Strength Right Hip Extension: 4/5 Right Hip ABduction: 4/5 LLE AROM (degrees) Left Knee Extension: -15 Left Knee Flexion: 101 Left Ankle Plantar Flexion: 18 LLE Strength Left Hip Flexion: 4/5 Left Hip Extension: 2+/5 Left Hip ABduction: 2+/5 Left Knee Flexion: 2+/5 Left Knee Extension: 2+/5 Left Ankle Dorsiflexion: 4/5 Left Ankle Plantar Flexion: 4/5  Exercise/Treatments  Standing Other Standing Knee Exercises: neutral, and split stand 3D hip excursions in parallel bars.  Supine Heel Slides: 20 reps;AROM Bridges:  (split stance, neutral, IR, ER)   Physical Therapy Assessment and Plan PT Assessment and Plan Clinical Impression Statement: Patient displays limited knee mobility and hip and knee weakness in Lt LE >  Rt LE following a Lt TKA  that was performed due to chronic knee pain. Patiwent displays difficulty weight shifting to Lt LE during gait and functional activities secondary to weakness and stiffness. Patient displays an initially  positive response to exercises with increased stride length , improved hip rotation, and noted less pain.  Pt will benefit from skilled therapeutic intervention in order to improve on the following deficits: Abnormal gait;Decreased balance;Decreased mobility;Decreased range of motion;Decreased strength;Decreased coordination;Increased fascial restricitons;Increased edema;Impaired flexibility Rehab Potential: Good PT Frequency: Min 2X/week PT Duration: 12 weeks PT Treatment/Interventions: DME instruction;Gait training;Stair training;Therapeutic activities;Functional mobility training;Therapeutic exercise;Balance training;Neuromuscular re-education;Patient/family education;Manual techniques PT Plan: Initializer LE stetching program next session to address limited hamstring, hip flexor, glut and calf mobility, Progress gait mechanics to improve walkign and prgress to ambulation without the cain when appropriate per patient gait and pain.     Goals Home Exercise Program Pt/caregiver will Perform Home Exercise Program: For increased ROM;For increased strengthening PT Goal: Perform Home Exercise Program - Progress: Goal set today PT Short Term Goals Time to Complete Short Term Goals: 4 weeks PT Short Term Goal 1: Patient Knee mobility will improve 5 to 110 degrees of knee flexion to normalize gait PT Short Term Goal 2: patient quad, hamstring, and glut will improve to 4/5 so patient can stand up from chair without UE support PT Long Term Goals Time to Complete Long Term Goals: 12 weeks PT Long Term Goal 1: Patient will be able to fully flex knee to 120 degrees so she can squat to the ground to work in her garden. PT Long Term Goal 2: Patient will be able to fully extend knee to 0 degrees of flexion to walk with full stride length.  Long Term Goal 3: patient quad, hamstring, and glut will improve to 5/5 so patient can lift 30lb from the floor.   Problem List Patient Active Problem List   Diagnosis  Date Noted  . Stiffness of joint, not elsewhere classified, lower leg 11/01/2013  . PONV (postoperative nausea and vomiting)   . History of MRSA infection   . Osteoarthritis of left knee 10/13/2013    PT - End of Session Activity Tolerance: Patient tolerated treatment well General Behavior During Therapy: WFL for tasks assessed/performed PT Plan of Care PT Home Exercise Plan: SFT bridges and split stance 3D hip excursions.  PT Patient Instructions: perform 1 to 2 tiems daily.  Consulted and Agree with Plan of Care: Patient  GP Functional Assessment Tool Used: FOTO Functional Limitation: Changing and maintaining body position Changing and Maintaining Body Position Current Status (Z6109(G8981): At least 20 percent but less than 40 percent impaired, limited or restricted Changing and Maintaining Body Position Goal Status (U0454(G8982): At least 40 percent but less than 60 percent impaired, limited or restricted  Doyne KeelCash R Ronelle Michie 11/01/2013, 5:54 PM  Physician Documentation Your signature is required to indicate approval of the treatment plan as stated above.  Please sign and either send electronically or make a copy of this report for your files and return this physician signed original.   Please mark one 1.__approve of plan  2. ___approve of plan with the following conditions.   ______________________________  _____________________ Physician Signature                                                                                                             Date

## 2013-11-08 ENCOUNTER — Ambulatory Visit (HOSPITAL_COMMUNITY)
Admission: RE | Admit: 2013-11-08 | Discharge: 2013-11-08 | Disposition: A | Discharge status: Home / Self Care | Payer: BC Managed Care – PPO | Source: Ambulatory Visit | Attending: Family Medicine | Admitting: Family Medicine

## 2013-11-08 NOTE — Progress Notes (Signed)
Physical Therapy Treatment Patient Details  Name: Jasmine Ruiz MRN: [id number] Date of Birth: 04/23/1956  Today's Date: 11/08/2013 Time: 1520-1605 PT Time Calculation (min): 45 min Charge: GaI 1520-1535, TE [id number]  Visit#: 2 of 24  Re-eval: 12/01/13 Assessment Diagnosis: Difficulty walking secodnary to knee pain, weakness s/p knee replacement.  Surgical Date: 10/13/13 Next MD Visit: Madelon Lipsaffrey later April 2015  Authorization: [employer]  Authorization Time Period:    Authorization Visit#:   of     Subjective: Symptoms/Limitations Symptoms: No pain today, knee just stiff.  Complaince with HEP. Pain Assessment Currently in Pain?: No/denies  Precautions/Restrictions  Restrictions LLE Weight Bearing: Weight bearing as tolerated  Exercise/Treatments Stretches Active Hamstring Stretch: 3 reps;30 seconds;Limitations Active Hamstring Stretch Limitations: standing on 16 in box 3 directions Quad Stretch: 3 reps;30 seconds Hip Flexor Stretch: 2 reps;30 seconds Piriformis Stretch: 2 reps;30 seconds;Limitations Piriformis Stretch Limitations: figure 4 with towel Gastroc Stretch: 3 reps;30 seconds Soleus Stretch: 3 reps;30 seconds Aerobic Stationary Bike: 8 min seat for ROM Standing Gait Training: SPC CGA x 520' min cueing for sequencing Supine Quad Sets: 5 reps;Limitations Quad Sets Limitations: max tactile cueing to improve quad contraction and relax gluts Short Arc Quad Sets: Left;10 reps Prone  Hamstring Curl: 10 reps Hip Extension: Left;10 reps Other Prone Exercises: TKE 10x5"      Physical Therapy Assessment and Plan PT Assessment and Plan Clinical Impression Statement: Began POC to improve gait mechanics with LRAD, pt able to ambulate with SPC with good sequencing following demonstration and cueing for heel to toe pattern.  Added quad, hamstrings, hip flexor, piriformis and gastroc/soleus stretches to improve flexibility and began strengthening exercises for Lt LE.  No  reports of increased pain through session, reports of increased flexibilty.  Pt encouraged to apply ice with elevattion for pain and edema control at home.   PT Plan: Continue with POC for gait training with LRAD [facility]), next session focus on improving quad activtion with SAQ, supine and prone TKE, quad sets supine and standing TKE.  Improve knee extension and flexion.    Goals PT Short Term Goals Time to Complete Short Term Goals: 4 weeks PT Short Term Goal 1: Patient Knee mobility will improve 5 to 110 degrees of knee flexion to normalize gait PT Short Term Goal 1 - Progress: Progressing toward goal PT Short Term Goal 2: patient quad, hamstring, and glut will improve to 4/5 so patient can stand up from chair without UE support PT Short Term Goal 2 - Progress: Progressing toward goal PT Long Term Goals Time to Complete Long Term Goals: 12 weeks PT Long Term Goal 1: Patient will be able to fully flex knee to 120 degrees so she can squat to the ground to work in her garden. PT Long Term Goal 1 - Progress: Progressing toward goal PT Long Term Goal 2: Patient will be able to fully extend knee to 0 degrees of flexion to walk with full stride length.  Long Term Goal 3: patient quad, hamstring, and glut will improve to 5/5 so patient can lift 30lb from the floor.   Problem List Patient Active Problem List   Diagnosis Date Noted  . Stiffness of joint, not elsewhere classified, lower leg 11/01/2013  . PONV (postoperative nausea and vomiting)   . History of MRSA infection   . Osteoarthritis of left knee 10/13/2013    PT - End of Session Activity Tolerance: Patient tolerated treatment well General Behavior During Therapy: [facility] assessed/performed  GP  Juel BurrowCasey Jo Icker Swigert 11/08/2013, 4:20 PM

## 2013-11-10 ENCOUNTER — Ambulatory Visit (HOSPITAL_COMMUNITY)
Admission: RE | Admit: 2013-11-10 | Discharge: 2013-11-10 | Disposition: A | Discharge status: Home / Self Care | Payer: BC Managed Care – PPO | Source: Ambulatory Visit

## 2013-11-10 NOTE — Progress Notes (Signed)
Physical Therapy Treatment Patient Details  Name: Lawernce IonJayne B Fandino MRN: [id number] Date of Birth: Nov 13, 1955  Today's Date: 11/10/2013 Time: [id number] PT Time Calculation (min): 47 min Charge: TE [id number]  Visit#: 3 of 24  Re-eval: 12/01/13    Authorization: [employer]BS  Authorization Time Period:    Authorization Visit#:   of     Subjective: Symptoms/Limitations Symptoms: Knee is really stiff today, minimal pain Pain Assessment Currently in Pain?: Yes Pain Score: 2  Pain Location: Knee Pain Orientation: Left  Objective:   Exercise/Treatments Stretches Active Hamstring Stretch: 3 reps;30 seconds;Limitations Active Hamstring Stretch Limitations: with rope Quad Stretch: 3 reps;30 seconds ITB Stretch: 3 reps;30 seconds;Limitations ITB Stretch Limitations: supine with rope Piriformis Stretch: 3 reps;30 seconds;Limitations Piriformis Stretch Limitations: figure 4 with towel Gastroc Stretch: 3 reps;30 seconds;Limitations Gastroc Stretch Limitations: slant board Aerobic Stationary Bike: 8 min seat 836for ROM Standing Gait Training: SPC min cueing for sequencing and approriate height Supine Quad Sets: 10 reps;Limitations Quad Sets Limitations: max tactile and verbal cueing Short Arc Quad Sets: Left;10 reps Straight Leg Raises: 10 reps Prone  Hamstring Curl: 10 reps Hip Extension: Left;10 reps Other Prone Exercises: TKE 10x5"      Physical Therapy Assessment and Plan PT Assessment and Plan Clinical Impression Statement: Session focus on improving gait and ROM.  Multimodal cueing required to improve quadriceps activation and reduce compensation with gluteal contractions.  Pt purchased a SPC, gait training for sequencing and appropriate height with improved gait mechanics at end of session.  Continued with stretches to improve flexibilty. PT Plan: Continue with current POC to improve gait mechanis and ROM initially progress to functional stregthening activities.    Goals PT  Short Term Goals Time to Complete Short Term Goals: 4 weeks PT Short Term Goal 1: Patient Knee mobility will improve 5 to 110 degrees of knee flexion to normalize gait PT Short Term Goal 1 - Progress: Progressing toward goal PT Short Term Goal 2: patient quad, hamstring, and glut will improve to 4/5 so patient can stand up from chair without UE support PT Short Term Goal 2 - Progress: Progressing toward goal PT Long Term Goals Time to Complete Long Term Goals: 12 weeks PT Long Term Goal 1: Patient will be able to fully flex knee to 120 degrees so she can squat to the ground to work in her garden. PT Long Term Goal 2: Patient will be able to fully extend knee to 0 degrees of flexion to walk with full stride length.  Long Term Goal 3: patient quad, hamstring, and glut will improve to 5/5 so patient can lift 30lb from the floor.   Problem List Patient Active Problem List   Diagnosis Date Noted  . Stiffness of joint, not elsewhere classified, lower leg 11/01/2013  . PONV (postoperative nausea and vomiting)   . History of MRSA infection   . Osteoarthritis of left knee 10/13/2013    PT - End of Session Activity Tolerance: Patient tolerated treatment well General Behavior During Therapy: WFL for tasks assessed/performed PT Plan of Care PT Home Exercise Plan: quad sets, hs curls and 4 way SLR and stretches HEP given  GP    Juel BurrowCasey Jo Cockerham 11/10/2013, 6:41 PM

## 2013-11-15 ENCOUNTER — Ambulatory Visit (HOSPITAL_COMMUNITY)
Admission: RE | Admit: 2013-11-15 | Discharge: 2013-11-15 | Disposition: A | Discharge status: Home / Self Care | Payer: BC Managed Care – PPO | Source: Ambulatory Visit | Attending: Family Medicine | Admitting: Family Medicine

## 2013-11-15 NOTE — Progress Notes (Signed)
Physical Therapy Treatment Patient Details  Name: Jasmine Ruiz MRN: [id number] Date of Birth: May 11, 1956  Today's Date: 11/15/2013 Time: [id number] PT Time Calculation (min): 60 min Visit#: 4 of 24  Re-eval: 12/01/13 Authorization: BCBS  Charges:  therex 1515-1550 (35'), manual 1551-1600 (14'), icepack [id number] (10')   Subjective: Pt states she is compliant with HEP.  Reports therapy is helping her.  Not having pain, however continues to have overall knee stiffness, especially at night when she's trying to rest.  Pt reports she has to get up and move her LE.  Exercise/Treatments Stretches Gastroc Stretch: 3 reps;30 seconds;Limitations Theme park managerGastroc Stretch Limitations: slant board Soleus Stretch: 3 reps;30 seconds Aerobic Stationary Bike: 8 min seat 6 for ROM Supine Knee Extension: PROM;Limitations Knee Extension Limitations: AROM: -6, PROM: 0 Knee Flexion: PROM;Limitations Knee Flexion Limitations: AROM: 110, PROM:  115 Sidelying Hip ABduction: 10 reps;Left Prone  Other Prone Exercises: prone knee hangs with myofascial techniques to posterior knee Standing Forward lunges 10 reps with Lt SLS:  Max of 3, Lt: 30" Lateral step ups 4" 10 reps 1 HHA Forward step ups 4" 10 reps 1 HHA   Modalities Modalities: Cryotherapy Manual Therapy Manual Therapy: Myofascial release Myofascial Release: Myofascial release to posterior knee while in prone knee hang X 5 minutes, anterior/lateral MFR prior to PROM 5' Cryotherapy Number Minutes Cryotherapy: 10 Minutes Cryotherapy Location: Knee Type of Cryotherapy: Ice pack  Physical Therapy Assessment and Plan PT Assessment:  Progressed standing exercises for increasing functional strengthening.   No c/o pain with any added activity today.  Pt pleased with her ability to SLS on her Lt LE.  Added prone knee hang to increase knee extension.  Noted tightness/adhesions posterior knee.  Began Myofascial techniques to decrease adhesions with good  results.  A/PROM remeasured today for Lt knee flexion with overall gain of approximately 10 degrees each way.  PT Plan: Progress functional strengthening activities and work on gait without AD. Add forward step downs.     Problem List Patient Active Problem List   Diagnosis Date Noted  . Stiffness of joint, not elsewhere classified, lower leg 11/01/2013  . PONV (postoperative nausea and vomiting)   . History of MRSA infection   . Osteoarthritis of left knee 10/13/2013    PT - End of Session Activity Tolerance: Patient tolerated treatment well General Behavior During Therapy: WFL for tasks assessed/performed PT Plan of Care PT Home Exercise Plan: quad sets, hs curls and 4 way SLR and stretches HEP given   Lurena NidaAmy B Hermilo Dutter, PTA/CLT 11/15/2013, 4:29 PM

## 2013-11-17 ENCOUNTER — Ambulatory Visit (HOSPITAL_COMMUNITY)
Admission: RE | Admit: 2013-11-17 | Discharge: 2013-11-17 | Disposition: A | Discharge status: Home / Self Care | Payer: BC Managed Care – PPO | Source: Ambulatory Visit | Attending: Family Medicine | Admitting: Family Medicine

## 2013-11-17 NOTE — Progress Notes (Addendum)
Physical Therapy Progress note prior MD apt/ Treatment note  Patient Details  Name: Jasmine Ruiz MRN: [id number] Date of Birth: [birth year]  Today's Date: 11/17/2013 Time: 2423-5361 PT Time Calculation (min): 19 min Charge : TE 4431-5400, MMT/ROM Measurement 308 [id number], Manual 1645-1700, Ice 1700-1710              Visit#: 5 of 24  Re-eval: 12/01/13 Assessment Diagnosis: Difficulty walking secodnary to knee pain, weakness s/p knee replacement.  Surgical Date: 10/13/13 Next MD Visit: French Ana later April 2015  Authorization: BCBS    Authorization Time Period:    Authorization Visit#:   of     Subjective Symptoms/Limitations Symptoms: Pt stated knee feels unstable today, pain scale 3/10. Pain Assessment Currently in Pain?: Yes Pain Score: 3  Pain Location: Knee Pain Orientation: Left  Precautions/Restrictions  Restrictions LLE Weight Bearing: Weight bearing as tolerated  Assessment LLE AROM (degrees) Left Knee Extension: -4 (was -6) Left Knee Flexion: 113 (was 110) LLE PROM (degrees) Left Knee Extension: 0 Left Knee Flexion: 122 (was 115) LLE Strength Left Hip Flexion: 4/5 (was 4/5) Left Hip Extension: 3+/5 (was 2+/5) Left Hip ABduction:  (4+/5 was 2+/5) Left Knee Flexion: 3+/5 (was 2+/5) Left Knee Extension: 3+/5 (was 2+/5) Left Ankle Dorsiflexion:  (4+/5 was 4/5) Left Ankle Plantar Flexion:  (4+/5 was 4/5)  Exercise/Treatments Stretches Gastroc Stretch: 3 reps;30 seconds;Limitations Gastroc Stretch Limitations: slant board Aerobic Stationary Bike: 8 min seat 6 for ROM Standing Lateral Step Up: Left;15 reps;Hand Hold: 1;Step Height: 4" Forward Step Up: Left;15 reps;Hand Hold: 1;Step Height: 4" Step Down: Left;10 reps;Hand Hold: 1;Step Height: 4" SLS: max of 41" SLS with Vectors: 3x 5" with 1 HHA Supine Quad Sets: 10 reps;Limitations Heel Slides: 10 reps Knee Extension: PROM Knee Extension Limitations: AROM 4-113 Knee Flexion: PROM;Limitations Knee  Flexion Limitations: AROM 4-113   Modalities Modalities: Cryotherapy Manual Therapy Manual Therapy: Myofascial release Myofascial Release: MFR to posterior knee while in prone knee hang.  MFR perimeter of incision Cryotherapy Number Minutes Cryotherapy: 10 Minutes Cryotherapy Location: Knee Type of Cryotherapy: Ice pack  Physical Therapy Assessment and Plan PT Assessment and Plan Clinical Impression Statement: Progress note prior MD apt next week with the following findings:  Pt progressing well towards al goals.  Compliant with HEP daily and able to demonstrate approriate technique with all exercises.  Pt has met all STGs 2/2 and progressing well towards LTGs.  Improved AROM 4-113.  PROM 0-122.  Improved strength all muscualture.  Session focus on improving functional strengthening and stability, began step down training with HHA required and noted weak eccentric control descending stairs.  Pt improved balance this session, able to SLS 41" with no HHA.  Manual techniques complete at end of session to reduce fascial restrictions perimeter of incision with improve knee extension noted following manual.  Ice applied at end of session PT Plan: Continue with current POC to improve gait mechanis and ROM initially progress to functional stregthening activities..    Goals Home Exercise Program PT Goal: Perform Home Exercise Program - Progress: Met (3x daily) PT Short Term Goals Time to Complete Short Term Goals: 4 weeks PT Short Term Goal 1: Patient Knee mobility will improve 5 to 110 degrees of knee flexion to normalize gait PT Short Term Goal 1 - Progress: Met (4-113) PT Short Term Goal 2: patient quad, hamstring, and glut will improve to 4/5 so patient can stand up from chair without UE support PT Short Term Goal 2 - Progress: Met PT Long  Term Goals Time to Complete Long Term Goals: 12 weeks PT Long Term Goal 1: Patient will be able to fully flex knee to 120 degrees so she can squat to the  ground to work in her garden. PT Long Term Goal 1 - Progress: Progressing toward goal PT Long Term Goal 2: Patient will be able to fully extend knee to 0 degrees of flexion to walk with full stride length.  PT Long Term Goal 2 - Progress: Progressing toward goal Long Term Goal 3: patient quad, hamstring, and glut will improve to 5/5 so patient can lift 30lb from the floor.  Long Term Goal 3 Progress: Progressing toward goal Long Term Goal 4: New goal per PT on 11/17/2013: Able to ascend and descend stairwell reciprocal pattern wtihout AD or handrails  Problem List Patient Active Problem List   Diagnosis Date Noted  . Stiffness of joint, not elsewhere classified, lower leg 11/01/2013  . PONV (postoperative nausea and vomiting)   . History of MRSA infection   . Osteoarthritis of left knee 10/13/2013    PT - End of Session Activity Tolerance: Patient tolerated treatment well General Behavior During Therapy: [facility] for tasks assessed/performed  GP    Aldona Lento 11/17/2013, 5:29 PM  Physician Documentation Your signature is required to indicate approval of the treatment plan as stated above.  Please sign and either send electronically or make a copy of this report for your files and return this physician signed original.   Please mark one 1.__approve of plan  2. ___approve of plan with the following conditions.   ______________________________                                                          _____________________ Physician Signature                                                                                                             Date

## 2013-11-17 NOTE — Progress Notes (Deleted)
Physical Therapy Treatment/ Progress notes prior MD apt Patient Details  Name: Jasmine Ruiz MRN: [id number] Date of Birth: 1956-01-07  Today's Date: 11/17/2013 Time: 9417-4081 PT Time Calculation (min): 28 min Charge:  TE 4481-8563, MMT/ROM Measurement 1640-1645, Ice 1700-1710  Visit#: 5 of 24  Re-eval: 12/01/13 Assessment Diagnosis: Difficulty walking secodnary to knee pain, weakness s/p knee replacement.  Surgical Date: 10/13/13 Next MD Visit: French Ana later April 2015  Authorization: BCBS  Authorization Time Period:    Authorization Visit#:   of     Subjective: Symptoms/Limitations Symptoms: Pt stated knee feels unstable today, pain scale 3/10. Pain Assessment Currently in Pain?: Yes Pain Score: 3  Pain Location: Knee Pain Orientation: Left  Precautions/Restrictions  Restrictions LLE Weight Bearing: Weight bearing as tolerated  Exercise/Treatments Stretches Gastroc Stretch: 3 reps;30 seconds;Limitations Gastroc Stretch Limitations: slant board Aerobic Stationary Bike: 8 min seat 6 for ROM Standing Lateral Step Up: Left;15 reps;Hand Hold: 1;Step Height: 4" Forward Step Up: Left;15 reps;Hand Hold: 1;Step Height: 4" Step Down: Left;10 reps;Hand Hold: 1;Step Height: 4" SLS: max of 41" SLS with Vectors: 3x 5" with 1 HHA Supine Quad Sets: 10 reps;Limitations Heel Slides: 10 reps Knee Extension: PROM Knee Extension Limitations: AROM 4-113 Knee Flexion: PROM;Limitations Knee Flexion Limitations: AROM 4-113   Modalities Modalities: Cryotherapy Manual Therapy Manual Therapy: Myofascial release Myofascial Release: MFR to posterior knee while in prone knee hang.  MFR perimeter of incision Cryotherapy Number Minutes Cryotherapy: 10 Minutes Cryotherapy Location: Knee Type of Cryotherapy: Ice pack  Physical Therapy Assessment and Plan PT Assessment and Plan Clinical Impression Statement: Progress note prior MD apt next week with the following findings:  Pt  progressing well towards al goals.  Compliant with HEP daily and able to demonstrate approriate technique with all exercises.  Pt has met all STGs 2/2 and progressing well towards LTGs.  Improved AROM 4-113.  PROM 0-122.  Improved strength all muscualture.  Session focus on improving functional strengthening and stability, began step down training with HHA required and noted weak eccentric control descending stairs.  Pt improved balance this session, able to SLS 41" with no HHA.  Manual techniques complete at end of session to reduce fascial restrictions perimeter of incision with improve knee extension noted following manual.  Ice applied at end of session PT Plan: Continue with current POC to improve gait mechanis and ROM initially progress to functional stregthening activities..    Goals Home Exercise Program PT Goal: Perform Home Exercise Program - Progress: Met (3x daily) PT Short Term Goals Time to Complete Short Term Goals: 4 weeks PT Short Term Goal 1: Patient Knee mobility will improve 5 to 110 degrees of knee flexion to normalize gait PT Short Term Goal 1 - Progress: Met (4-113) PT Short Term Goal 2: patient quad, hamstring, and glut will improve to 4/5 so patient can stand up from chair without UE support PT Short Term Goal 2 - Progress: Met PT Long Term Goals Time to Complete Long Term Goals: 12 weeks PT Long Term Goal 1: Patient will be able to fully flex knee to 120 degrees so she can squat to the ground to work in her garden. PT Long Term Goal 1 - Progress: Progressing toward goal PT Long Term Goal 2: Patient will be able to fully extend knee to 0 degrees of flexion to walk with full stride length.  PT Long Term Goal 2 - Progress: Progressing toward goal Long Term Goal 3: patient quad, hamstring, and glut will improve to 5/5  so patient can lift 30lb from the floor.  Long Term Goal 3 Progress: Progressing toward goal Long Term Goal 4: New goal per PT on 11/17/2013: Able to ascend and  descend stairwell reciprocal pattern wtihout AD or handrails  Problem List Patient Active Problem List   Diagnosis Date Noted  . Stiffness of joint, not elsewhere classified, lower leg 11/01/2013  . PONV (postoperative nausea and vomiting)   . History of MRSA infection   . Osteoarthritis of left knee 10/13/2013    PT - End of Session Activity Tolerance: Patient tolerated treatment well General Behavior During Therapy: [facility] for tasks assessed/performed  GP    Aldona Lento 11/17/2013, 5:23 PM

## 2013-11-22 ENCOUNTER — Ambulatory Visit (HOSPITAL_COMMUNITY)
Admission: RE | Admit: 2013-11-22 | Discharge: 2013-11-22 | Disposition: A | Discharge status: Home / Self Care | Payer: BC Managed Care – PPO | Source: Ambulatory Visit | Attending: Family Medicine | Admitting: Family Medicine

## 2013-11-22 NOTE — Progress Notes (Signed)
Physical Therapy Treatment Patient Details  Name: Jasmine Ruiz MRN: [id number] Date of Birth: August 18, 1955  Today's Date: 11/22/2013 Time: 1515-1610 PT Time Calculation (min): 55 min  Charges: TherEx 1515-1600, Manual 1600-1610 Visit#: 6 of 24  Re-eval: 12/01/13 Assessment Diagnosis: Difficulty walking secodnary to knee pain, weakness s/p knee replacement.  Surgical Date: 10/13/13 Next MD Visit: French Ana later April 2015  Authorization: BCBS  Authorization Time Period:    Authorization Visit#:  6 of   24  Subjective: Symptoms/Limitations Symptoms: Patient recently saw MD who told the patient he was very pleased with progress Pertinent History: February 2014  arthroscopic surgery, then [place] months later, resulting in current knee replacement 10-13-13.  How long can you sit comfortably?: 1 hour How long can you walk comfortably?: 77mnutes Pain Assessment Currently in Pain?: Yes Pain Score: 2  Pain Location: Knee Pain Orientation: Left Pain Type: Surgical pain Pain Frequency: Intermittent Pain Relieving Factors: rest, moving it Effect of Pain on Daily Activities: pain/stiffness with promonged sitting. unable to walk 2 miles a day. Zumba  Precautions/Restrictions  Restrictions LLE Weight Bearing: Weight bearing as tolerated  Exercise/Treatments Stretches Active Hamstring Stretch Limitations: 10x3 seconds at 8" box 3 way Aerobic Stationary Bike: 665m at 7 for ROM Standing Forward Lunges: Limitations;15 reps;Both Forward Lunges Limitations: 8" box Side Lunges: Limitations;20 reps;Both Side Lunges Limitations: 8" box Lateral Step Up: Left;10 reps;Step Height: 4";Step Height: 6";3 sets Forward Step Up: Left;Step Height: 4";Step Height: 6";2 sets;10 reps Step Down: 5 reps;Step Height: 4";Step Height: 6";2 sets;Left Other Standing Knee Exercises: anterior lateral lunge walk [id number] Standing Knee Exercises: frontal play hip excursion walk  Manual Therapy Manual  Therapy: Myofascial release Myofascial Release: MFR to posterior knee while in prone knee hang. MFR perimeter of incision, quad, scar massage.  Cryotherapy Number Minutes Cryotherapy: 10 Minutes Cryotherapy Location: Knee Type of Cryotherapy: Ice pack  Physical Therapy Assessment and Plan PT Assessment and Plan Clinical Impression Statement: Session focus on improving functional strengthening and stability, began step down training with HHA required and noted weak eccentric control descending stairs. Pt improved balance this session, able to SLS 41" with no HHA. Session also focused on gait to improve ambulation to decrease need for cane y improving abilty to weight shift during gait, resulting in improvemnt in equal stride length. Manual techniques complete at end of session to reduce fascial restrictions perimeter of incision with improve knee extension noted following manual. Ice applied at end of session Pt will benefit from skilled therapeutic intervention in order to improve on the following deficits: Abnormal gait;Decreased balance;Decreased mobility;Decreased range of motion;Decreased strength;Decreased coordination;Increased fascial restricitons;Increased edema;Impaired flexibility Rehab Potential: Good PT Frequency: Min 2X/week PT Treatment/Interventions: DME instruction;Gait training;Stair training;Therapeutic activities;Functional mobility training;Therapeutic exercise;Balance training;Neuromuscular re-education;Patient/family education;Manual techniques PT Plan: Continue with current POC to improve gait mechanis and ROM initially progress to functional stregthening activities..    Goals PT Short Term Goals PT Short Term Goal 1 - Progress: Met PT Short Term Goal 2 - Progress: Met PT Long Term Goals PT Long Term Goal 1 - Progress: Progressing toward goal PT Long Term Goal 2 - Progress: Progressing toward goal Long Term Goal 3 Progress: Progressing toward goal  Problem  List Patient Active Problem List   Diagnosis Date Noted  . Stiffness of joint, not elsewhere classified, lower leg 11/01/2013  . PONV (postoperative nausea and vomiting)   . History of MRSA infection   . Osteoarthritis of left knee 10/13/2013    PT - End of Session Activity  Tolerance: Patient tolerated treatment well General Behavior During Therapy: [employer] for tasks assessed/performed  GP    Kayleen Alig R Gissell Barra PT DPT 11/22/2013, 4:21 PM

## 2013-11-29 ENCOUNTER — Ambulatory Visit (HOSPITAL_COMMUNITY)
Admission: RE | Admit: 2013-11-29 | Discharge: 2013-11-29 | Disposition: A | Discharge status: Home / Self Care | Payer: BC Managed Care – PPO | Source: Ambulatory Visit | Attending: Family Medicine | Admitting: Family Medicine

## 2013-11-29 DIAGNOSIS — M6281 Muscle weakness (generalized): Secondary | ICD-10-CM | POA: Insufficient documentation

## 2013-11-29 DIAGNOSIS — M25569 Pain in unspecified knee: Secondary | ICD-10-CM | POA: Insufficient documentation

## 2013-11-29 DIAGNOSIS — R262 Difficulty in walking, not elsewhere classified: Secondary | ICD-10-CM | POA: Insufficient documentation

## 2013-11-29 DIAGNOSIS — M25669 Stiffness of unspecified knee, not elsewhere classified: Secondary | ICD-10-CM | POA: Insufficient documentation

## 2013-11-29 DIAGNOSIS — IMO0001 Reserved for inherently not codable concepts without codable children: Secondary | ICD-10-CM | POA: Insufficient documentation

## 2013-11-29 DIAGNOSIS — Z96659 Presence of unspecified artificial knee joint: Secondary | ICD-10-CM | POA: Insufficient documentation

## 2013-11-29 NOTE — Progress Notes (Addendum)
Physical Therapy Treatment Patient Details  Name: Jasmine Ruiz MRN: [id number] Date of Birth: 1956/04/20  Today's Date: 11/29/2013 Time: 7846-96291517-1617 PT Time Calculation (min): 60 min Charge: TE [id number], Manual 1555-1605, Ice [id number]  Visit#: 7 of 24  Re-eval: 12/01/13 Assessment Diagnosis: Difficulty walking secodnary to knee pain, weakness s/p knee replacement.  Surgical Date: 10/13/13 Next MD Visit: Madelon LipsCaffrey later 01/02/2014  Authorization: BCBS  Authorization Time Period:    Authorization Visit#:   of     Subjective: Symptoms/Limitations Symptoms: Pt stated steps are still difficult, pt reported confidence with walking are improving.  Has been doing more walking and yard work and now driving.   Pain Assessment Currently in Pain?: Yes Pain Score: 3  Pain Location: Knee Pain Orientation: Left  Precautions/Restrictions  Restrictions LLE Weight Bearing: Weight bearing as tolerated  Exercise/Treatments Aerobic Stationary Bike: 8 min seat 7 for ROM Standing Forward Lunges: 20 reps;Limitations Forward Lunges Limitations: 8" box Lateral Step Up: Left;Hand Hold: 2;Step Height: 6";15 reps Forward Step Up: Left;15 reps;Hand Hold: 1;Step Height: 6" Step Down: Left;15 reps;Hand Hold: 1;Step Height: 6" Stairs: 3 sets reciprocal pattern with 1 HR Other Standing Knee Exercises: frontal play hip excursion walk   Modalities Modalities: Cryotherapy Manual Therapy Manual Therapy: Myofascial release Myofascial Release: MFR perimeter of incision and scar massage, STM to quad to reduce tightness. Cryotherapy Number Minutes Cryotherapy: 10 Minutes Cryotherapy Location: Knee Type of Cryotherapy: Ice pack  Physical Therapy Assessment and Plan PT Assessment and Plan Clinical Impression Statement: Pt progressing well towards PT POC, session focus on improve functional strengthenig and stabilty wtih stair training to improve mechanics and confidence.  Pt initially with increase HHA  doing reciprocal pattern stairwell training, therapist facilitation and vc-ing to improve weight distribution with improved mechanics following cueing.  Continued MFR to incision perimeter and scar tissue massage to reduce fascial restrictions.  Ended session wtih ice for pain and edema control.   PT Plan: Continue with current POC to improve gait mechanis and ROM initially progress to functional stregthening activities.  Next session begin sports cord with therapist in cord pullilng to improve confidence walking dogs without AD.    Goals PT Short Term Goals Time to Complete Short Term Goals: 4 weeks PT Short Term Goal 1: Patient Knee mobility will improve 5 to 110 degrees of knee flexion to normalize gait PT Short Term Goal 2: patient quad, hamstring, and glut will improve to 4/5 so patient can stand up from chair without UE support PT Long Term Goals Time to Complete Long Term Goals: 12 weeks PT Long Term Goal 1: Patient will be able to fully flex knee to 120 degrees so she can squat to the ground to work in her garden. PT Long Term Goal 1 - Progress: Progressing toward goal PT Long Term Goal 2: Patient will be able to fully extend knee to 0 degrees of flexion to walk with full stride length.  PT Long Term Goal 2 - Progress: Progressing toward goal Long Term Goal 3: patient quad, hamstring, and glut will improve to 5/5 so patient can lift 30lb from the floor.  Long Term Goal 4: New goal per PT on 11/17/2013: Able to ascend and descend stairwell reciprocal pattern wtihout AD or handrails Long Term Goal 4 Progress: Progressing toward goal  Problem List Patient Active Problem List   Diagnosis Date Noted  . Stiffness of joint, not elsewhere classified, lower leg 11/01/2013  . PONV (postoperative nausea and vomiting)   . History of MRSA  infection   . Osteoarthritis of left knee 10/13/2013    PT - End of Session Activity Tolerance: Patient tolerated treatment well General Behavior During  The[facility]: [facility] assessed/performed  GP    Juel Burrowasey Jo Cockerham 11/29/2013, 4:45 PM

## 2013-12-01 ENCOUNTER — Ambulatory Visit (HOSPITAL_COMMUNITY)
Admission: RE | Admit: 2013-12-01 | Discharge: 2013-12-01 | Disposition: A | Discharge status: Home / Self Care | Payer: BC Managed Care – PPO | Source: Ambulatory Visit | Attending: Family Medicine | Admitting: Family Medicine

## 2013-12-01 NOTE — Progress Notes (Signed)
Physical Therapy Re-evaluation/Treatment  Patient Details  Name: Jasmine Ruiz MRN: [id number] Date of Birth: 1956-06-10  Today's Date: 12/01/2013 Time: 1517-1600 PT Time Calculation (min): 43 min Charge: TE 1517-1550, MMT/ROM Measurement/FOTO 1550-1600 x 1             Visit#: 8 of 24  Re-eval: 12/15/13 Assessment Diagnosis: Difficulty walking secodnary to knee pain, weakness s/p knee replacement.  Surgical Date: 10/13/13 Next MD Visit: French Ana later 01/02/2014  Authorization: [employer]    Authorization Time Period:    Authorization Visit#:   of     Subjective Symptoms/Limitations Symptoms: Pt stated calf sore today,reports increased ease with walking.  Pt wondering about walking dog without AD Pain Assessment Currently in Pain?: Yes Pain Score: 3  Pain Location: Knee Pain Orientation: Left  Precautions/Restrictions  Restrictions LLE Weight Bearing: Weight bearing as tolerated  Sensation/Coordination/Flexibility/Functional Tests Functional Tests Functional Tests: FOTO 63% status, 37% limitations (was status 39%, limitations 61%)  Assessment RLE Strength Right Hip Extension:  (4+/5 was 4/5) Right Hip ABduction: 5/5 (was 4/5) LLE AROM (degrees) Left Knee Extension: -3 (-4) Left Knee Flexion: 128 (was 113) LLE PROM (degrees) Left Knee Extension:  (was 0) Left Knee Flexion:  (was 122) LLE Strength Left Hip Flexion:  (4+/5 was 4/5) Left Hip Extension:  (4+5 was 3+/5) Left Hip ABduction: 5/5 (was 4+/5) Left Knee Flexion: 4/5 (was 3+/5) Left Knee Extension: 5/5 (was 3+/5) Left Ankle Dorsiflexion:  (4+/5 was 4+/5) Left Ankle Plantar Flexion: 4/5 (was 4+/5)  Exercise/Treatments Stretches Gastroc Stretch: 3 reps;30 seconds;Limitations Gastroc Stretch Limitations: slant board Aerobic Stationary Bike: 8 min seat 6 for ROM Standing Terminal Knee Extension: Left;10 reps;Theraband Theraband Level (Terminal Knee Extension): Level 4 (Blue) Stairs: 4 sets reciprocal pattern  with 1 HR Rocker Board: 2 minutes;Limitations Rocker Board Limitations: R/L Gait Training: Gait training no AD, focus on equalized stance and stride length, heel to toe with TKE Supine Quad Sets: 10 reps;Limitations Heel Slides: 10 reps Knee Extension: PROM    Physical Therapy Assessment and Plan PT Assessment and Plan Clinical Impression Statement: Re-eval complete with the following findings:  Pt independent with HEP daily and able to demonstrate appropriate technique with all exercises.  Improved AROM -3-128.  Improved Bil LE strength.  Pt continues to demonstrate weak eccentric control descending stairs and utilizes UE to pull up stairs ascending, improves with vc-ing.  Gait mechanics require cueing to increase stance phase Lt LE and equalize stride length.  Recommend continuing OPPT to improve knee extensin to improve gait mechanics and functional strengthening with squatting for yard work and IT trainer.  Pt with improved perceived functional abilities with FOTO score of 63% statis (was 39% initial eval). PT Plan: Recommend continuing OPPT for 2x 2 more weeks to improve knee extension, gait mechanics and functional strengthening.  Next session begin sports cord with therapist in cord to improve gait mechanics and confidence while walking dogs without AD.  Add vector stance to improve balance and stabilty, TKE, squats and stair training.      Goals Home Exercise Program PT Goal: Perform Home Exercise Program - Progress: Met (3x daily) PT Short Term Goals Time to Complete Short Term Goals: 4 weeks PT Short Term Goal 1: Patient Knee mobility will improve 5 to 110 degrees of knee flexion to normalize gait PT Short Term Goal 2: patient quad, hamstring, and glut will improve to 4/5 so patient can stand up from chair without UE support PT Long Term Goals Time to Complete Long Term  Goals: 12 weeks PT Long Term Goal 1: Patient will be able to fully flex knee to 120 degrees so she can squat  to the ground to work in her garden. PT Long Term Goal 1 - Progress: Met PT Long Term Goal 2: Patient will be able to fully extend knee to 0 degrees of flexion to walk with full stride length.  PT Long Term Goal 2 - Progress: Progressing toward goal (lacking 3 degrees extension) Long Term Goal 3: patient quad, hamstring, and glut will improve to 5/5 so patient can lift 30lb from the floor.  Long Term Goal 3 Progress: Progressing toward goal Long Term Goal 4: New goal per PT on 11/17/2013: Able to ascend and descend stairwell reciprocal pattern wtihout AD or handrails Long Term Goal 4 Progress: Progressing toward goal  Problem List Patient Active Problem List   Diagnosis Date Noted  . Stiffness of joint, not elsewhere classified, lower leg 11/01/2013  . PONV (postoperative nausea and vomiting)   . History of MRSA infection   . Osteoarthritis of left knee 10/13/2013    PT - End of Session Activity Tolerance: Patient tolerated treatment well General Behavior During Therapy: WFL for tasks assessed/performed  GP Functional Assessment Tool Used: FOTO Functional Limitation: Changing and maintaining body position Changing and Maintaining Body Position Current Status (M4268): At least 20 percent but less than 40 percent impaired, limited or restricted Changing and Maintaining Body Position Goal Status (T4196): At least 1 percent but less than 20 percent impaired, limited or restricted  Aldona Lento 12/01/2013, 6:33 PM  Physician Documentation Your signature is required to indicate approval of the treatment plan as stated above.  Please sign and either send electronically or make a copy of this report for your files and return this physician signed original.   Please mark one 1.__approve of plan  2. ___approve of plan with the following conditions.   ______________________________                                                          _____________________ Physician Signature                                                                                                              Date

## 2013-12-05 ENCOUNTER — Telehealth (HOSPITAL_COMMUNITY): Payer: Self-pay

## 2013-12-05 NOTE — Progress Notes (Signed)
Physical Therapy Re-evaluation/Treatment  Patient Details  Name: Jasmine Ruiz MRN: [id number] Date of Birth: 1956-06-10  Today's Date: 12/01/2013 Time: 1517-1600 PT Time Calculation (min): 43 min Charge: TE 1517-1550, MMT/ROM Measurement/FOTO 1550-1600 x 1             Visit#: 8 of 24  Re-eval: 12/15/13 Assessment Diagnosis: Difficulty walking secodnary to knee pain, weakness s/p knee replacement.  Surgical Date: 10/13/13 Next MD Visit: French Ana later 01/02/2014  Authorization: [employer]    Authorization Time Period:    Authorization Visit#:   of     Subjective Symptoms/Limitations Symptoms: Pt stated calf sore today,reports increased ease with walking.  Pt wondering about walking dog without AD Pain Assessment Currently in Pain?: Yes Pain Score: 3  Pain Location: Knee Pain Orientation: Left  Precautions/Restrictions  Restrictions LLE Weight Bearing: Weight bearing as tolerated  Sensation/Coordination/Flexibility/Functional Tests Functional Tests Functional Tests: FOTO 63% status, 37% limitations (was status 39%, limitations 61%)  Assessment RLE Strength Right Hip Extension:  (4+/5 was 4/5) Right Hip ABduction: 5/5 (was 4/5) LLE AROM (degrees) Left Knee Extension: -3 (-4) Left Knee Flexion: 128 (was 113) LLE PROM (degrees) Left Knee Extension:  (was 0) Left Knee Flexion:  (was 122) LLE Strength Left Hip Flexion:  (4+/5 was 4/5) Left Hip Extension:  (4+5 was 3+/5) Left Hip ABduction: 5/5 (was 4+/5) Left Knee Flexion: 4/5 (was 3+/5) Left Knee Extension: 5/5 (was 3+/5) Left Ankle Dorsiflexion:  (4+/5 was 4+/5) Left Ankle Plantar Flexion: 4/5 (was 4+/5)  Exercise/Treatments Stretches Gastroc Stretch: 3 reps;30 seconds;Limitations Gastroc Stretch Limitations: slant board Aerobic Stationary Bike: 8 min seat 6 for ROM Standing Terminal Knee Extension: Left;10 reps;Theraband Theraband Level (Terminal Knee Extension): Level 4 (Blue) Stairs: 4 sets reciprocal pattern  with 1 HR Rocker Board: 2 minutes;Limitations Rocker Board Limitations: R/L Gait Training: Gait training no AD, focus on equalized stance and stride length, heel to toe with TKE Supine Quad Sets: 10 reps;Limitations Heel Slides: 10 reps Knee Extension: PROM    Physical Therapy Assessment and Plan PT Assessment and Plan Clinical Impression Statement: Re-eval complete with the following findings:  Pt independent with HEP daily and able to demonstrate appropriate technique with all exercises.  Improved AROM -3-128.  Improved Bil LE strength.  Pt continues to demonstrate weak eccentric control descending stairs and utilizes UE to pull up stairs ascending, improves with vc-ing.  Gait mechanics require cueing to increase stance phase Lt LE and equalize stride length.  Recommend continuing OPPT to improve knee extensin to improve gait mechanics and functional strengthening with squatting for yard work and IT trainer.  Pt with improved perceived functional abilities with FOTO score of 63% statis (was 39% initial eval). PT Plan: Recommend continuing OPPT for 2x 2 more weeks to improve knee extension, gait mechanics and functional strengthening.  Next session begin sports cord with therapist in cord to improve gait mechanics and confidence while walking dogs without AD.  Add vector stance to improve balance and stabilty, TKE, squats and stair training.      Goals Home Exercise Program PT Goal: Perform Home Exercise Program - Progress: Met (3x daily) PT Short Term Goals Time to Complete Short Term Goals: 4 weeks PT Short Term Goal 1: Patient Knee mobility will improve 5 to 110 degrees of knee flexion to normalize gait PT Short Term Goal 2: patient quad, hamstring, and glut will improve to 4/5 so patient can stand up from chair without UE support PT Long Term Goals Time to Complete Long Term  Goals: 12 weeks PT Long Term Goal 1: Patient will be able to fully flex knee to 120 degrees so she can squat  to the ground to work in her garden. PT Long Term Goal 1 - Progress: Met PT Long Term Goal 2: Patient will be able to fully extend knee to 0 degrees of flexion to walk with full stride length.  PT Long Term Goal 2 - Progress: Progressing toward goal (lacking 3 degrees extension) Long Term Goal 3: patient quad, hamstring, and glut will improve to 5/5 so patient can lift 30lb from the floor.  Long Term Goal 3 Progress: Progressing toward goal Long Term Goal 4: New goal per PT on 11/17/2013: Able to ascend and descend stairwell reciprocal pattern wtihout AD or handrails Long Term Goal 4 Progress: Progressing toward goal  Problem List Patient Active Problem List   Diagnosis Date Noted  . Stiffness of joint, not elsewhere classified, lower leg 11/01/2013  . PONV (postoperative nausea and vomiting)   . History of MRSA infection   . Osteoarthritis of left knee 10/13/2013    PT - End of Session Activity Tolerance: Patient tolerated treatment well General Behavior During Therapy: WFL for tasks assessed/performed  GP Functional Assessment Tool Used: FOTO Functional Limitation: Changing and maintaining body position Changing and Maintaining Body Position Current Status (G2836): At least 20 percent but less than 40 percent impaired, limited or restricted Changing and Maintaining Body Position Goal Status (O2947): At least 1 percent but less than 20 percent impaired, limited or restricted  Aldona Lento 12/01/2013, 6:33 PM  Devona Konig PT DPT  Physician Documentation Your signature is required to indicate approval of the treatment plan as stated above.  Please sign and either send electronically or make a copy of this report for your files and return this physician signed original.   Please mark one 1.__approve of plan  2. ___approve of plan with the following conditions.   ______________________________                                                           _____________________ Physician Signature                                                                                                             Date

## 2013-12-06 ENCOUNTER — Ambulatory Visit (HOSPITAL_COMMUNITY): Payer: BC Managed Care – PPO | Admitting: Physical Therapy

## 2013-12-08 ENCOUNTER — Ambulatory Visit (HOSPITAL_COMMUNITY): Payer: BC Managed Care – PPO | Admitting: Physical Therapy

## 2013-12-13 ENCOUNTER — Ambulatory Visit (HOSPITAL_COMMUNITY): Payer: BC Managed Care – PPO | Admitting: Physical Therapy

## 2013-12-15 ENCOUNTER — Ambulatory Visit (HOSPITAL_COMMUNITY): Payer: BC Managed Care – PPO | Admitting: Physical Therapy

## 2013-12-15 ENCOUNTER — Ambulatory Visit (INDEPENDENT_AMBULATORY_CARE_PROVIDER_SITE_OTHER): Payer: BC Managed Care – PPO | Admitting: Family Medicine

## 2013-12-15 ENCOUNTER — Encounter: Payer: Self-pay | Admitting: Family Medicine

## 2013-12-15 VITALS — BP 118/70 | HR 68 | Temp 97.2°F | Resp 16 | Ht 65.0 in | Wt 145.0 lb

## 2013-12-15 DIAGNOSIS — M7592 Shoulder lesion, unspecified, left shoulder: Secondary | ICD-10-CM

## 2013-12-15 DIAGNOSIS — G4700 Insomnia, unspecified: Secondary | ICD-10-CM

## 2013-12-15 DIAGNOSIS — M719 Bursopathy, unspecified: Secondary | ICD-10-CM

## 2013-12-15 DIAGNOSIS — M67919 Unspecified disorder of synovium and tendon, unspecified shoulder: Secondary | ICD-10-CM

## 2013-12-15 MED ORDER — ZOLPIDEM TARTRATE 10 MG PO TABS: 10.0000 mg | ORAL_TABLET | Freq: Every evening | ORAL | Status: AC | PRN

## 2013-12-15 NOTE — Progress Notes (Signed)
Subjective:    Patient ID: Jasmine Ruiz, female    DOB: Nov 05, 1955, 58 y.o.   MRN: [id number]  HPI Patient presents complaining of insomnia. She says she has a difficult time turning her mind off at night to allow her to go to sleep. When she goes to sleep she is able to stay asleep. He denies any anxiety or depression. She would like to use Ambien sparingly to help her go to sleep. She has a prescription home but his For over 2 years and she has still not used all 30 tablets.  She is also reporting pain in her left shoulder the last 9 weeks. It hurts with abduction greater than 90. It aches and throbs at times. It hurts with internal rotation. It hurts with abduction against resistance.  She has a positive empty can sign and a positive Hawkins sign. She has negative Spurling sign. She has no evidence of biceps tendinitis. Past Medical History  Diagnosis Date  . Insomnia     takes Ambien nightly as needed  . PONV (postoperative nausea and vomiting)   . History of bronchitis 07/2013  . Migraine     takes Relpax daily as needed;last migraine end of Feb 2015  . Arthritis   . Joint pain   . Plantar fasciitis   . Tendonitis     left should  . Joint swelling   . History of MRSA infection    Past Surgical History  Procedure Laterality Date  . Mouth surgery    . Knee arthroscopy Left 2014  . Abdominal hysterectomy    . Exploratory laparotomy    . Total knee arthroplasty Left 10/13/2013    Procedure: TOTAL KNEE ARTHROPLASTY;  Surgeon: Thera FlakeW D Caffrey Jr., MD;  Location: [facility] OR;  Service: Orthopedics;  Laterality: Left;   Current Outpatient Prescriptions on File Prior to Visit  Medication Sig Dispense Refill  . eletriptan (RELPAX) 40 MG tablet Take 1 tablet (40 mg total) by mouth as needed. may repeat in 2 hours if necessary for migraine  10 tablet  5   No current facility-administered medications on file prior to visit.   Allergies  Allergen Reactions  . Hydrocodone Anaphylaxis  .  Percocet [Oxycodone-Acetaminophen] Anaphylaxis  . Prednisone Swelling  . Sulfa Antibiotics     unknown  . Vicodin [Hydrocodone-Acetaminophen] Nausea And Vomiting   History   Social History  . Marital Status: Married    Spouse Name: N/A    Number of Children: N/A  . Years of Education: N/A   Occupational History  . Not on file.   Social History Main Topics  . Smoking status: Passive Smoke Exposure - Never Smoker  . Smokeless tobacco: Not on file  . Alcohol Use: No  . Drug Use: No  . Sexual Activity: Yes    Birth Control/ Protection: Surgical   Other Topics Concern  . Not on file   Social History Narrative  . No narrative on file      Review of Systems  All other systems reviewed and are negative.      Objective:   Physical Exam  Vitals reviewed. Cardiovascular: Normal rate and regular rhythm.   Pulmonary/Chest: Effort normal and breath sounds normal.  Psychiatric: She has a normal mood and affect. Her behavior is normal. Judgment and thought content normal.          Assessment & Plan:  1. Insomnia - zolpidem (AMBIEN) 10 MG tablet; Take 1 tablet (10 mg total) by mouth  at bedtime as needed for sleep.  Dispense: 30 tablet; Refill: 2  2. Left supraspinatus tendonitis Using sterile technique, I injected the left subacromial space with a mixture of 2 cc of lidocaine 0.1% without epinephrine, 2 cc of Marcaine, and 2 cc of 40 mg mL Kenalog. The patient tolerated the procedure well without complication.

## 2013-12-20 ENCOUNTER — Ambulatory Visit (HOSPITAL_COMMUNITY): Payer: BC Managed Care – PPO

## 2013-12-21 ENCOUNTER — Telehealth: Payer: Self-pay | Admitting: Family Medicine

## 2013-12-21 NOTE — Telephone Encounter (Signed)
Submitted PA throught [email or web address] on 12/20/13 and received form for additional information. Form filled out and faxed back to ins company.

## 2013-12-22 ENCOUNTER — Ambulatory Visit (HOSPITAL_COMMUNITY): Payer: BC Managed Care – PPO

## 2013-12-22 NOTE — Progress Notes (Signed)
  Patient Details  Name: Jasmine Ruiz MRN: [id number] Date of Birth: 03/21/1956  Today's Date: 12/22/2013  Patient called and self discharged due to being satisfied with her progress and wished to continue exercises independently  Doyne Keel 12/22/2013, 12:07 PM

## 2013-12-22 NOTE — Addendum Note (Signed)
Encounter addended by: Doyne Keel, PT on: 12/22/2013 12:09 PM<BR>     Documentation filed: Episodes, Letters, Notes Section

## 2013-12-27 ENCOUNTER — Ambulatory Visit (HOSPITAL_COMMUNITY): Payer: BC Managed Care – PPO | Admitting: Physical Therapy

## 2013-12-29 ENCOUNTER — Ambulatory Visit (HOSPITAL_COMMUNITY): Payer: BC Managed Care – PPO | Admitting: Physical Therapy

## 2014-01-08 NOTE — Telephone Encounter (Signed)
Denied and faxed to pharm

## 2014-01-15 ENCOUNTER — Other Ambulatory Visit (HOSPITAL_COMMUNITY)
Admission: RE | Admit: 2014-01-15 | Discharge: 2014-01-15 | Disposition: A | Discharge status: Home / Self Care | Payer: BC Managed Care – PPO | Source: Ambulatory Visit | Attending: Obstetrics & Gynecology | Admitting: Obstetrics & Gynecology

## 2014-01-15 ENCOUNTER — Encounter: Payer: Self-pay | Admitting: Obstetrics & Gynecology

## 2014-01-15 ENCOUNTER — Ambulatory Visit (INDEPENDENT_AMBULATORY_CARE_PROVIDER_SITE_OTHER): Payer: BC Managed Care – PPO | Admitting: Obstetrics & Gynecology

## 2014-01-15 VITALS — BP 110/80 | Ht 64.0 in | Wt 140.0 lb

## 2014-01-15 DIAGNOSIS — Z01419 Encounter for gynecological examination (general) (routine) without abnormal findings: Secondary | ICD-10-CM | POA: Insufficient documentation

## 2014-01-15 DIAGNOSIS — Z1212 Encounter for screening for malignant neoplasm of rectum: Secondary | ICD-10-CM

## 2014-01-15 DIAGNOSIS — Z1151 Encounter for screening for human papillomavirus (HPV): Secondary | ICD-10-CM | POA: Insufficient documentation

## 2014-01-15 NOTE — Progress Notes (Signed)
Patient ID: Jasmine IonJayne B Kuechle, female   DOB: [birth year], 58 y.o.   MRN: [id number] Subjective:     Jasmine Ruiz is a 58 y.o. female here for a routine exam.  No LMP recorded. Patient has had a hysterectomy. No obstetric history on file. Birth Control Method:  na Menstrual Calendar(currently): na  Current complaints: none.   Current acute medical issues:  Had LKR 09/2013   Recent Gynecologic History No LMP recorded. Patient has had a hysterectomy. Last Pap: ,  normal Last mammogram: 7/23,  normal  Past Medical History  Diagnosis Date  . Insomnia     takes Ambien nightly as needed  . PONV (postoperative nausea and vomiting)   . History of bronchitis 07/2013  . Migraine     takes Relpax daily as needed;last migraine end of Feb 2015  . Arthritis   . Joint pain   . Plantar fasciitis   . Tendonitis     left should  . Joint swelling   . History of MRSA infection     Past Surgical History  Procedure Laterality Date  . Mouth surgery    . Knee arthroscopy Left 2014  . Abdominal hysterectomy    . Exploratory laparotomy    . Total knee arthroplasty Left 10/13/2013    Procedure: TOTAL KNEE ARTHROPLASTY;  Surgeon: Thera FlakeW D Caffrey Jr., MD;  Location: [facility] OR;  Service: Orthopedics;  Laterality: Left;    OB History   Grav Para Term Preterm Abortions TAB SAB Ect Mult Living                  History   Social History  . Marital Status: Married    Spouse Name: N/A    Number of Children: N/A  . Years of Education: N/A   Social History Main Topics  . Smoking status: Passive Smoke Exposure - Never Smoker  . Smokeless tobacco: None  . Alcohol Use: No  . Drug Use: No  . Sexual Activity: Yes    Birth Control/ Protection: Surgical   Other Topics Concern  . None   Social History Narrative  . None    History reviewed. No pertinent family history.   Review of Systems  Review of Systems  Constitutional: Negative for fever, chills, weight loss, malaise/fatigue and diaphoresis.   HENT: Negative for hearing loss, ear pain, nosebleeds, congestion, sore throat, neck pain, tinnitus and ear discharge.   Eyes: Negative for blurred vision, double vision, photophobia, pain, discharge and redness.  Respiratory: Negative for cough, hemoptysis, sputum production, shortness of breath, wheezing and stridor.   Cardiovascular: Negative for chest pain, palpitations, orthopnea, claudication, leg swelling and PND.  Gastrointestinal: negative for abdominal pain. Negative for heartburn, nausea, vomiting, diarrhea, constipation, blood in stool and melena.  Genitourinary: Negative for dysuria, urgency, frequency, hematuria and flank pain.  Musculoskeletal: Negative for myalgias, back pain, joint pain and falls.  Skin: Negative for itching and rash.  Neurological: Negative for dizziness, tingling, tremors, sensory change, speech change, focal weakness, seizures, loss of consciousness, weakness and headaches.  Endo/Heme/Allergies: Negative for environmental allergies and polydipsia. Does not bruise/bleed easily.  Psychiatric/Behavioral: Negative for depression, suicidal ideas, hallucinations, memory loss and substance abuse. The patient is not nervous/anxious and does not have insomnia.        Objective:    Physical Exam  Vitals reviewed. Constitutional: She is oriented to person, place, and time. She appears well-developed and well-nourished.  HENT:  Head: Normocephalic and atraumatic.  Right Ear: External ear normal.  Left Ear: External ear normal.  Nose: Nose normal.  Mouth/Throat: Oropharynx is clear and moist.  Eyes: Conjunctivae and EOM are normal. Pupils are equal, round, and reactive to light. Right eye exhibits no discharge. Left eye exhibits no discharge. No scleral icterus.  Neck: Normal range of motion. Neck supple. No tracheal deviation present. No thyromegaly present.  Cardiovascular: Normal rate, regular rhythm, normal heart sounds and intact distal pulses.  Exam  reveals no gallop and no friction rub.   No murmur heard. Respiratory: Effort normal and breath sounds normal. No respiratory distress. She has no wheezes. She has no rales. She exhibits no tenderness.  GI: Soft. Bowel sounds are normal. She exhibits no distension and no mass. There is no tenderness. There is no rebound and no guarding.  Genitourinary:  Breasts no masses skin changes or nipple changes bilaterally      Vulva is normal without lesions Vagina is pink moist without discharge Cervix normal in appearance and pap is done Uterus is absent Adnexa is negative Rectal    hemoccult negative, normal tone, no masses  Musculoskeletal: Normal range of motion. She exhibits no edema and no tenderness.  Neurological: She is alert and oriented to person, place, and time. She has normal reflexes. She displays normal reflexes. No cranial nerve deficit. She exhibits normal muscle tone. Coordination normal.  Skin: Skin is warm and dry. No rash noted. No erythema. No pallor.  Psychiatric: She has a normal mood and affect. Her behavior is normal. Judgment and thought content normal.       Assessment:    Healthy female exam.    Plan:    Mammogram ordered. Follow up in: 1 year.

## 2014-01-17 LAB — CYTOLOGY - PAP

## 2014-01-22 ENCOUNTER — Other Ambulatory Visit: Payer: Self-pay | Admitting: Obstetrics & Gynecology

## 2014-01-22 DIAGNOSIS — Z1231 Encounter for screening mammogram for malignant neoplasm of breast: Secondary | ICD-10-CM

## 2014-02-15 ENCOUNTER — Ambulatory Visit (HOSPITAL_COMMUNITY)
Admission: RE | Admit: 2014-02-15 | Discharge: 2014-02-15 | Disposition: A | Discharge status: Home / Self Care | Payer: BC Managed Care – PPO | Source: Ambulatory Visit | Attending: Obstetrics & Gynecology | Admitting: Obstetrics & Gynecology

## 2014-02-15 DIAGNOSIS — Z1231 Encounter for screening mammogram for malignant neoplasm of breast: Secondary | ICD-10-CM | POA: Insufficient documentation

## 2014-02-28 ENCOUNTER — Other Ambulatory Visit: Payer: Self-pay | Admitting: Family Medicine

## 2014-03-06 ENCOUNTER — Telehealth: Payer: Self-pay | Admitting: *Deleted

## 2014-03-06 NOTE — Telephone Encounter (Signed)
Received PA request for Relpax.   PA submitted.

## 2014-03-15 NOTE — Telephone Encounter (Signed)
Received PA determination.   PA denied.  

## 2014-04-11 ENCOUNTER — Ambulatory Visit (INDEPENDENT_AMBULATORY_CARE_PROVIDER_SITE_OTHER): Payer: BC Managed Care – PPO | Admitting: Obstetrics & Gynecology

## 2014-04-11 ENCOUNTER — Encounter: Payer: Self-pay | Admitting: Obstetrics & Gynecology

## 2014-04-11 VITALS — BP 120/80 | Ht 64.4 in | Wt 139.0 lb

## 2014-04-11 DIAGNOSIS — R300 Dysuria: Secondary | ICD-10-CM

## 2014-04-11 DIAGNOSIS — N390 Urinary tract infection, site not specified: Secondary | ICD-10-CM

## 2014-04-11 LAB — POCT URINALYSIS DIPSTICK
Blood, UA: NEGATIVE
GLUCOSE UA: NEGATIVE
Ketones, UA: NEGATIVE
LEUKOCYTES UA: NEGATIVE
NITRITE UA: NEGATIVE
Protein, UA: NEGATIVE

## 2014-04-11 MED ORDER — NITROFURANTOIN MONOHYD MACRO 100 MG PO CAPS: 100.0000 mg | ORAL_CAPSULE | Freq: Two times a day (BID) | ORAL | Status: DC

## 2014-04-11 NOTE — Addendum Note (Signed)
Addended by: Richardson Chiquito on: 04/11/2014 04:41 PM   Modules accepted: Orders

## 2014-04-11 NOTE — Progress Notes (Signed)
Patient ID: Jasmine Ruiz, female   DOB: [birth year], 58 y.o.   MRN: [id number] Pt had episode of dysuria and frequency Monday am and then again this am, dribbles and then resolves  Never had it like this, had UTI, years and years go  UA negative   Will culture her urine  Will treat empirically with macrobid 100 BID

## 2014-04-14 LAB — URINE CULTURE

## 2014-07-03 ENCOUNTER — Ambulatory Visit (INDEPENDENT_AMBULATORY_CARE_PROVIDER_SITE_OTHER): Payer: BC Managed Care – PPO | Admitting: Family Medicine

## 2014-07-03 ENCOUNTER — Encounter: Payer: Self-pay | Admitting: Family Medicine

## 2014-07-03 VITALS — BP 116/76 | HR 80 | Temp 98.2°F | Resp 18 | Wt 143.0 lb

## 2014-07-03 DIAGNOSIS — M7552 Bursitis of left shoulder: Secondary | ICD-10-CM

## 2014-07-03 NOTE — Progress Notes (Signed)
Subjective:    Patient ID: Jasmine Ruiz, female    DOB: 1955-08-28, 58 y.o.   MRN: [id number]  HPI  Patient was seen in May with pain in her left shoulder. At that time I diagnosed her with subacromial bursitis. She received a cortisone injection which helped her immensely for several months. Recently over the last 3 months she has again developed pain in her left shoulder. She has pain with abduction greater than 90. She has pain with internal rotation more than external rotation. She has a positive empty can sign. She has a positive Hawkins sign. She has crepitus upon palpation of the shoulder during range of motion.  Past Medical History  Diagnosis Date  . Insomnia     takes Ambien nightly as needed  . PONV (postoperative nausea and vomiting)   . History of bronchitis 07/2013  . Migraine     takes Relpax daily as needed;last migraine end of Feb 2015  . Arthritis   . Joint pain   . Plantar fasciitis   . Tendonitis     left should  . Joint swelling   . History of MRSA infection    Past Surgical History  Procedure Laterality Date  . Mouth surgery    . Knee arthroscopy Left 2014  . Abdominal hysterectomy    . Exploratory laparotomy    . Total knee arthroplasty Left 10/13/2013    Procedure: TOTAL KNEE ARTHROPLASTY;  Surgeon: Thera FlakeW D Caffrey Jr., MD;  Location: [facility] OR;  Service: Orthopedics;  Laterality: Left;   Current Outpatient Prescriptions on File Prior to Visit  Medication Sig Dispense Refill  . RELPAX 40 MG tablet TAKE 1 TABLET BY MOUTH AS NEEDED. MAY REPEAT IN 2 HOURS IF NECESSARY FOR MIGRAINE 52 tablet 0   No current facility-administered medications on file prior to visit.   Allergies  Allergen Reactions  . Hydrocodone Anaphylaxis  . Percocet [Oxycodone-Acetaminophen] Anaphylaxis  . Prednisone Swelling  . Sulfa Antibiotics     unknown  . Vicodin [Hydrocodone-Acetaminophen] Nausea And Vomiting   History   Social History  . Marital Status: Married    Spouse Name:  N/A    Number of Children: N/A  . Years of Education: N/A   Occupational History  . Not on file.   Social History Main Topics  . Smoking status: Passive Smoke Exposure - Never Smoker  . Smokeless tobacco: Not on file  . Alcohol Use: No  . Drug Use: No  . Sexual Activity: Yes    Birth Control/ Protection: Surgical   Other Topics Concern  . Not on file   Social History Narrative    Review of Systems  All other systems reviewed and are negative.      Objective:   Physical Exam  Cardiovascular: Normal rate, regular rhythm and normal heart sounds.   Pulmonary/Chest: Effort normal and breath sounds normal.  Musculoskeletal:       Left shoulder: She exhibits decreased range of motion, tenderness, crepitus and pain. She exhibits no bony tenderness, no spasm and normal strength.  Vitals reviewed.         Assessment & Plan:  Subacromial bursitis, left  Patient appears to have bursitis in the shoulder along with tendinitis and rotator cuff. Using sterile technique, I injected the left shoulder with a mixture of 2 mL of lidocaine, 2 mL of Marcaine, and 2 mL of 40 mg per mL Kenalog. The patient tolerated the procedure well without complication. I recommended that the pain returns  or does not improve we proceed with an MRI of the left shoulder to rule out a tear in the rotator cuff. Also recommended that she begin home physical therapy exercises try to help prevent this in the future.

## 2014-07-05 ENCOUNTER — Telehealth: Payer: Self-pay | Admitting: Family Medicine

## 2014-07-05 NOTE — Telephone Encounter (Signed)
Pt has red blotchy area on upper cheek area.  Areas are hot. Is it from Steroid shot in shoulder?  Is there anything she can do?

## 2014-07-06 NOTE — Telephone Encounter (Signed)
Much better today is almost gone.  Told to call back if returns

## 2014-07-06 NOTE — Telephone Encounter (Signed)
If the rash is confined to her face, I do not feel it is from steroid shot.  Hard to say without seeing.  NTBS to give any reliable recommendations.

## 2014-09-12 ENCOUNTER — Other Ambulatory Visit: Payer: Self-pay | Admitting: Family Medicine

## 2014-09-12 MED ORDER — ELETRIPTAN HYDROBROMIDE 40 MG PO TABS: ORAL_TABLET | ORAL | Status: DC

## 2015-01-03 ENCOUNTER — Other Ambulatory Visit: Payer: Self-pay | Admitting: Family Medicine

## 2015-01-03 NOTE — Telephone Encounter (Signed)
Refill appropriate and filled per protocol. 

## 2015-01-04 ENCOUNTER — Encounter: Payer: Self-pay | Admitting: Family Medicine

## 2015-01-04 ENCOUNTER — Ambulatory Visit (INDEPENDENT_AMBULATORY_CARE_PROVIDER_SITE_OTHER): Payer: BC Managed Care – PPO | Admitting: Family Medicine

## 2015-01-04 VITALS — BP 110/70 | HR 78 | Temp 98.2°F | Resp 18 | Ht 65.0 in | Wt 150.0 lb

## 2015-01-04 DIAGNOSIS — M25512 Pain in left shoulder: Secondary | ICD-10-CM

## 2015-01-04 DIAGNOSIS — G8929 Other chronic pain: Secondary | ICD-10-CM | POA: Diagnosis not present

## 2015-01-04 NOTE — Progress Notes (Signed)
Subjective:    Patient ID: Jasmine Ruiz, female    DOB: 1956/04/23, 59 y.o.   MRN: [id number]  Arthritis   07/03/14 Patient was seen in May with pain in her left shoulder. At that time I diagnosed her with subacromial bursitis. She received a cortisone injection which helped her immensely for several months. Recently over the last 3 months she has again developed pain in her left shoulder. She has pain with abduction greater than 90. She has pain with internal rotation more than external rotation. She has a positive empty can sign. She has a positive Hawkins sign. She has crepitus upon palpation of the shoulder during range of motion.  AT that time, my plan was: Patient appears to have bursitis in the shoulder along with tendinitis and rotator cuff. Using sterile technique, I injected the left shoulder with a mixture of 2 mL of lidocaine, 2 mL of Marcaine, and 2 mL of 40 mg per mL Kenalog. The patient tolerated the procedure well without complication. I recommended that the pain returns or does not improve we proceed with an MRI of the left shoulder to rule out a tear in the rotator cuff. Also recommended that she begin home physical therapy exercises try to help prevent this in the future. 01/04/15 Patient's pain has returned in her left shoulder. This is been a problem now for over 2 years. The cortisone injections will help for 4-5 months and then the pain will return. She continues to have pain with abduction greater than 90. It hurts at night to sleep on her shoulder. She has a positive empty can sign and a positive Hawkins sign. She is trying physical therapy exercises that I gave her at home on a daily basis but she continues to have recurrent tendinitis in her rotator cuff.    Past Medical History  Diagnosis Date  . Insomnia     takes Ambien nightly as needed  . PONV (postoperative nausea and vomiting)   . History of bronchitis 07/2013  . Migraine     takes Relpax daily as needed;last  migraine end of Feb 2015  . Arthritis   . Joint pain   . Plantar fasciitis   . Tendonitis     left should  . Joint swelling   . History of MRSA infection    Past Surgical History  Procedure Laterality Date  . Mouth surgery    . Knee arthroscopy Left 2014  . Abdominal hysterectomy    . Exploratory laparotomy    . Total knee arthroplasty Left 10/13/2013    Procedure: TOTAL KNEE ARTHROPLASTY;  Surgeon: Thera Flake., MD;  Location: [facility] OR;  Service: Orthopedics;  Laterality: Left;   Current Outpatient Prescriptions on File Prior to Visit  Medication Sig Dispense Refill  . Ascorbic Acid (VITAMIN C PO) Take by mouth.    . Multiple Vitamin (MULTIVITAMIN) tablet Take 1 tablet by mouth daily.    . RELPAX 40 MG tablet TAKE 1 TABLET BY MOUTH AS NEEDED MAY REPEAT IN 2 HOURS AS NEEDED FOR MIGRAINE. MAXIMUM 2 TABLETS/DAY. 52 tablet 0  . zolpidem (AMBIEN) 10 MG tablet Take 10 mg by mouth at bedtime as needed for sleep.     No current facility-administered medications on file prior to visit.   Allergies  Allergen Reactions  . Hydrocodone Anaphylaxis  . Percocet [Oxycodone-Acetaminophen] Anaphylaxis  . Prednisone Swelling  . Sulfa Antibiotics     unknown  . Vicodin [Hydrocodone-Acetaminophen] Nausea And Vomiting  History   Social History  . Marital Status: Married    Spouse Name: N/A  . Number of Children: N/A  . Years of Education: N/A   Occupational History  . Not on file.   Social History Main Topics  . Smoking status: Passive Smoke Exposure - Never Smoker  . Smokeless tobacco: Not on file  . Alcohol Use: No  . Drug Use: No  . Sexual Activity: Yes    Birth Control/ Protection: Surgical   Other Topics Concern  . Not on file   Social History Narrative    Review of Systems  Musculoskeletal: Positive for arthritis.  All other systems reviewed and are negative.      Objective:   Physical Exam  Cardiovascular: Normal rate, regular rhythm and normal heart sounds.     Pulmonary/Chest: Effort normal and breath sounds normal.  Musculoskeletal:       Left shoulder: She exhibits decreased range of motion, tenderness, crepitus and pain. She exhibits no bony tenderness, no spasm and normal strength.  Vitals reviewed.         Assessment & Plan:  Chronic left shoulder pain - Plan: MR Shoulder Left Wo Contrast  we will begin by obtaining an MRI of the left shoulder. 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 Also suspect that she may have issues with her acromion that may impinge upon the tendon. Patient may benefit from surgical intervention if she does. Therefore I'll obtain MRI of the shoulder to characterize further and depending upon the results, I will likely recommend that she see an orthopedic surgeon.

## 2015-01-14 ENCOUNTER — Other Ambulatory Visit: Payer: Self-pay | Admitting: Obstetrics & Gynecology

## 2015-01-14 ENCOUNTER — Telehealth: Payer: Self-pay | Admitting: Family Medicine

## 2015-01-14 DIAGNOSIS — Z1231 Encounter for screening mammogram for malignant neoplasm of breast: Secondary | ICD-10-CM

## 2015-01-14 NOTE — Telephone Encounter (Signed)
Patient is calling regarding an mri that was supposed to be set up for her  [phone]

## 2015-01-15 NOTE — Telephone Encounter (Signed)
Pt aware pending appeal from insurance

## 2015-01-18 ENCOUNTER — Other Ambulatory Visit: Payer: Self-pay | Admitting: Family Medicine

## 2015-01-18 DIAGNOSIS — G8929 Other chronic pain: Secondary | ICD-10-CM

## 2015-01-18 DIAGNOSIS — M25512 Pain in left shoulder: Principal | ICD-10-CM

## 2015-01-18 NOTE — Telephone Encounter (Signed)
Dr. Ranell Patrick

## 2015-01-18 NOTE — Telephone Encounter (Signed)
Pt is aware that insurance denied her MRI and will be sending her to orthopedic, pt states that you had mentioned an orthopedic to her that she would like to go but could not remember the name and wanted me to make sure with you first before placing referral.   She thought you had said [facility], do you remember who you told her?

## 2015-01-18 NOTE — Telephone Encounter (Signed)
Referral placed to [facility] orthopedic

## 2015-01-21 ENCOUNTER — Encounter: Payer: Self-pay | Admitting: Obstetrics & Gynecology

## 2015-01-21 ENCOUNTER — Ambulatory Visit (INDEPENDENT_AMBULATORY_CARE_PROVIDER_SITE_OTHER): Payer: BC Managed Care – PPO | Admitting: Obstetrics & Gynecology

## 2015-01-21 VITALS — BP 120/70 | HR 76 | Ht 64.4 in | Wt 149.0 lb

## 2015-01-21 DIAGNOSIS — Z1212 Encounter for screening for malignant neoplasm of rectum: Secondary | ICD-10-CM | POA: Diagnosis not present

## 2015-01-21 DIAGNOSIS — Z01419 Encounter for gynecological examination (general) (routine) without abnormal findings: Secondary | ICD-10-CM

## 2015-01-21 DIAGNOSIS — Z1211 Encounter for screening for malignant neoplasm of colon: Secondary | ICD-10-CM

## 2015-01-21 NOTE — Progress Notes (Signed)
Patient ID: Jasmine Ruiz, female   DOB: [birth year], 59 y.o.   MRN: [id number] Subjective:     Jasmine Ruiz is a 59 y.o. female here for a routine exam.  No LMP recorded. Patient has had a hysterectomy. No obstetric history on file. Birth Control Method:  hysterectomy Menstrual Calendar(currently): none  Current complaints: none.   Current acute medical issues:  none   Recent Gynecologic History No LMP recorded. Patient has had a hysterectomy. Last Pap: ,   Last mammogram: 02/15/2014,  normal  Past Medical History  Diagnosis Date  . Insomnia     takes Ambien nightly as needed  . PONV (postoperative nausea and vomiting)   . History of bronchitis 07/2013  . Migraine     takes Relpax daily as needed;last migraine end of Feb 2015  . Arthritis   . Joint pain   . Plantar fasciitis   . Tendonitis     left should  . Joint swelling   . History of MRSA infection     Past Surgical History  Procedure Laterality Date  . Mouth surgery    . Knee arthroscopy Left 2014  . Abdominal hysterectomy    . Exploratory laparotomy    . Total knee arthroplasty Left 10/13/2013    Procedure: TOTAL KNEE ARTHROPLASTY;  Surgeon: Thera Flake., MD;  Location: [facility] OR;  Service: Orthopedics;  Laterality: Left;    OB History    No data available      History   Social History  . Marital Status: Married    Spouse Name: N/A  . Number of Children: N/A  . Years of Education: N/A   Social History Main Topics  . Smoking status: Passive Smoke Exposure - Never Smoker  . Smokeless tobacco: Not on file  . Alcohol Use: No  . Drug Use: No  . Sexual Activity: Yes    Birth Control/ Protection: Surgical   Other Topics Concern  . None   Social History Narrative    Family History  Problem Relation Age of Onset  . Diabetes Maternal Grandfather   . Hypertension Mother      Current outpatient prescriptions:  .  Ascorbic Acid (VITAMIN C PO), Take by mouth., Disp: , Rfl:  .  Multiple Vitamin  (MULTIVITAMIN) tablet, Take 1 tablet by mouth daily., Disp: , Rfl:  .  RELPAX 40 MG tablet, TAKE 1 TABLET BY MOUTH AS NEEDED MAY REPEAT IN 2 HOURS AS NEEDED FOR MIGRAINE. MAXIMUM 2 TABLETS/DAY., Disp: 52 tablet, Rfl: 0 .  zolpidem (AMBIEN) 10 MG tablet, Take 10 mg by mouth at bedtime as needed for sleep., Disp: , Rfl:   Review of Systems  Review of Systems  Constitutional: Negative for fever, chills, weight loss, malaise/fatigue and diaphoresis.  HENT: Negative for hearing loss, ear pain, nosebleeds, congestion, sore throat, neck pain, tinnitus and ear discharge.   Eyes: Negative for blurred vision, double vision, photophobia, pain, discharge and redness.  Respiratory: Negative for cough, hemoptysis, sputum production, shortness of breath, wheezing and stridor.   Cardiovascular: Negative for chest pain, palpitations, orthopnea, claudication, leg swelling and PND.  Gastrointestinal: negative for abdominal pain. Negative for heartburn, nausea, vomiting, diarrhea, constipation, blood in stool and melena.  Genitourinary: Negative for dysuria, urgency, frequency, hematuria and flank pain.  Musculoskeletal: Negative for myalgias, back pain, joint pain and falls.  Skin: Negative for itching and rash.  Neurological: Negative for dizziness, tingling, tremors, sensory change, speech change, focal weakness, seizures, loss of consciousness, weakness  and headaches.  Endo/Heme/Allergies: Negative for environmental allergies and polydipsia. Does not bruise/bleed easily.  Psychiatric/Behavioral: Negative for depression, suicidal ideas, hallucinations, memory loss and substance abuse. The patient is not nervous/anxious and does not have insomnia.        Objective:  Blood pressure 120/70, pulse 76, height 5' 4.4" (1.636 m), weight 149 lb (67.586 kg).   Physical Exam  Vitals reviewed. Constitutional: She is oriented to person, place, and time. She appears well-developed and well-nourished.  HENT:  Head:  Normocephalic and atraumatic.        Right Ear: External ear normal.  Left Ear: External ear normal.  Nose: Nose normal.  Mouth/Throat: Oropharynx is clear and moist.  Eyes: Conjunctivae and EOM are normal. Pupils are equal, round, and reactive to light. Right eye exhibits no discharge. Left eye exhibits no discharge. No scleral icterus.  Neck: Normal range of motion. Neck supple. No tracheal deviation present. No thyromegaly present.  Cardiovascular: Normal rate, regular rhythm, normal heart sounds and intact distal pulses.  Exam reveals no gallop and no friction rub.   No murmur heard. Respiratory: Effort normal and breath sounds normal. No respiratory distress. She has no wheezes. She has no rales. She exhibits no tenderness.  GI: Soft. Bowel sounds are normal. She exhibits no distension and no mass. There is no tenderness. There is no rebound and no guarding.  Genitourinary:  Breasts no masses skin changes or nipple changes bilaterally      Vulva is normal without lesions Vagina is pink moist without discharge Cervix absent Uterus is absent Adnexa is negative  {Rectal    hemoccult negative, normal tone, no masses Musculoskeletal: Normal range of motion. She exhibits no edema and no tenderness.  Neurological: She is alert and oriented to person, place, and time. She has normal reflexes. She displays normal reflexes. No cranial nerve deficit. She exhibits normal muscle tone. Coordination normal.  Skin: Skin is warm and dry. No rash noted. No erythema. No pallor.  Psychiatric: She has a normal mood and affect. Her behavior is normal. Judgment and thought content normal.       Assessment:    Healthy female exam.    Plan:    Mammogram ordered. Follow up in: 1 year.

## 2015-01-21 NOTE — Addendum Note (Signed)
Addended by: Criss AlvinePULLIAM, Cathyann Kilfoyle G on: 01/21/2015 03:42 PM   Modules accepted: Orders

## 2015-02-18 ENCOUNTER — Ambulatory Visit (HOSPITAL_COMMUNITY)
Admission: RE | Admit: 2015-02-18 | Discharge: 2015-02-18 | Disposition: A | Discharge status: Home / Self Care | Payer: BC Managed Care – PPO | Source: Ambulatory Visit | Attending: Obstetrics & Gynecology | Admitting: Obstetrics & Gynecology

## 2015-02-18 DIAGNOSIS — Z1231 Encounter for screening mammogram for malignant neoplasm of breast: Secondary | ICD-10-CM

## 2015-03-04 ENCOUNTER — Other Ambulatory Visit (HOSPITAL_COMMUNITY): Payer: Self-pay | Admitting: Orthopedic Surgery

## 2015-03-04 DIAGNOSIS — M25512 Pain in left shoulder: Secondary | ICD-10-CM

## 2015-03-14 ENCOUNTER — Ambulatory Visit (HOSPITAL_COMMUNITY): Payer: BC Managed Care – PPO

## 2015-03-14 IMAGING — MG MM DIGITAL SCREENING
4 series · 4 of 4 positions shown · non-contrast
Comparison: Previous exam(s).

CLINICAL DATA: Screening.

EXAM:
DIGITAL SCREENING BILATERAL MAMMOGRAM WITH CAD

[L CC]
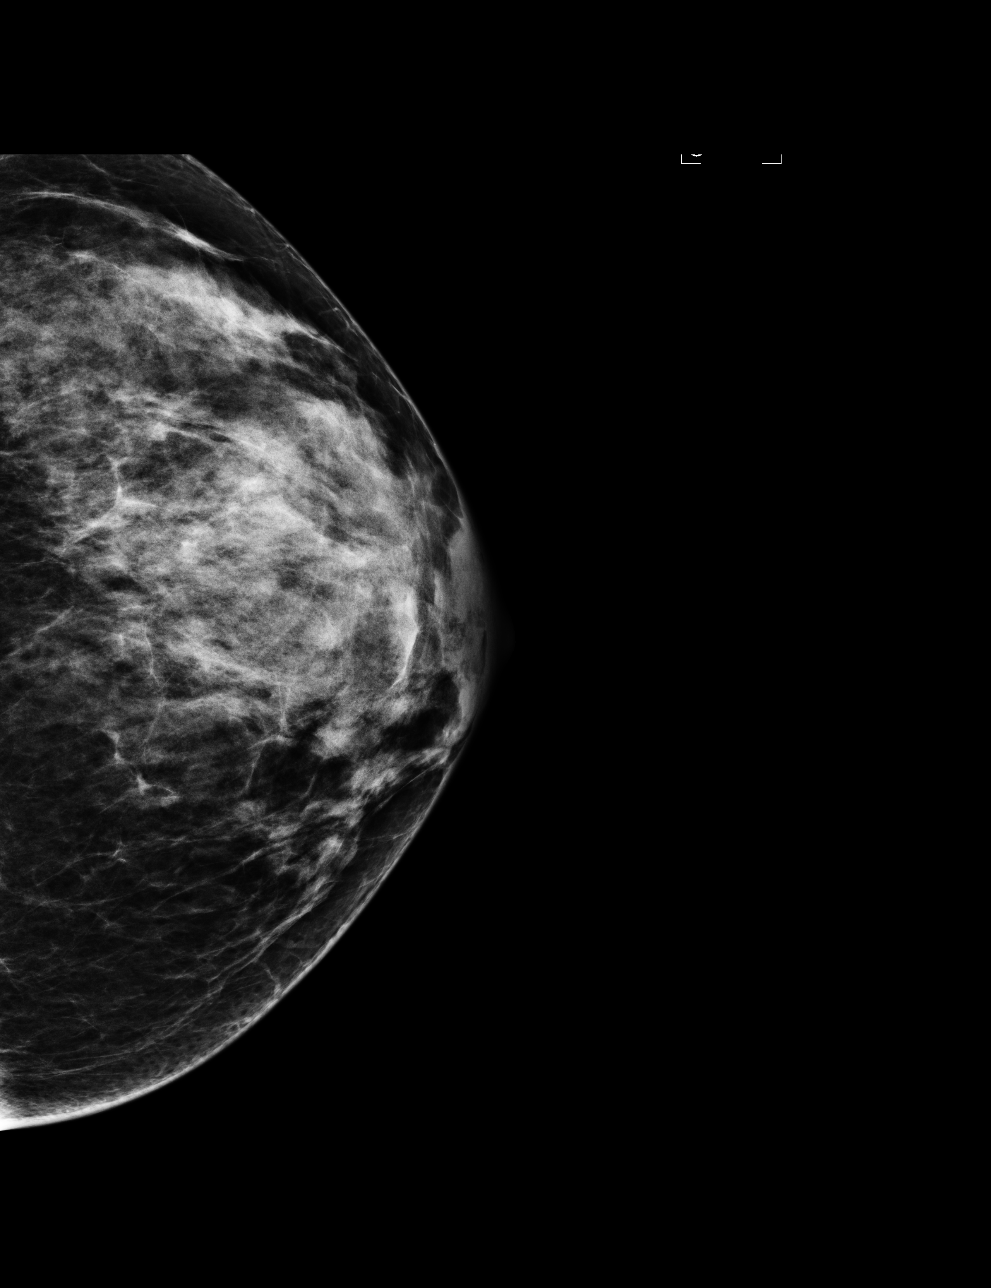

[L MLO]
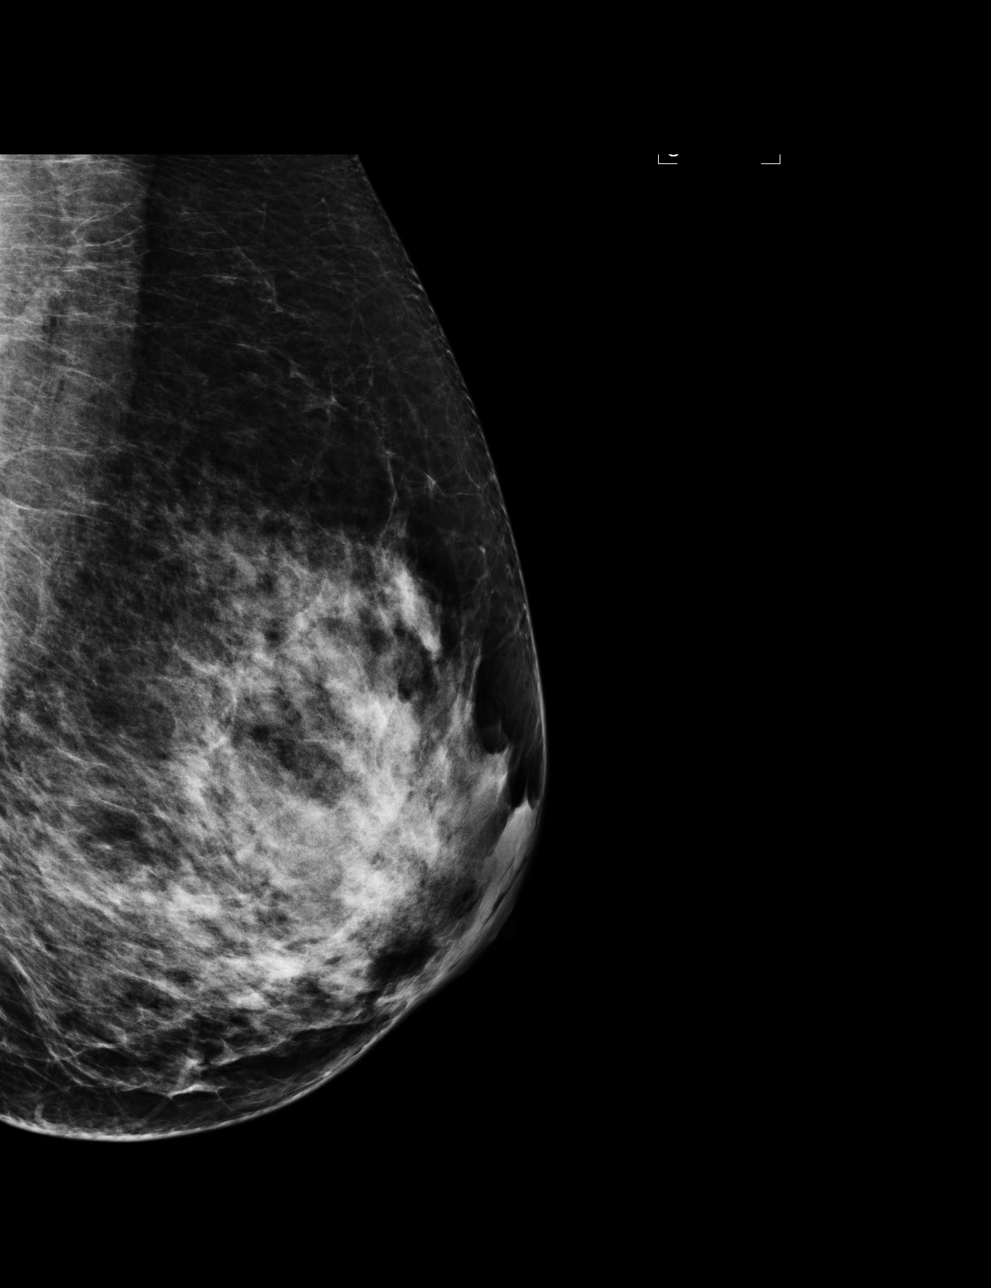

[R CC]
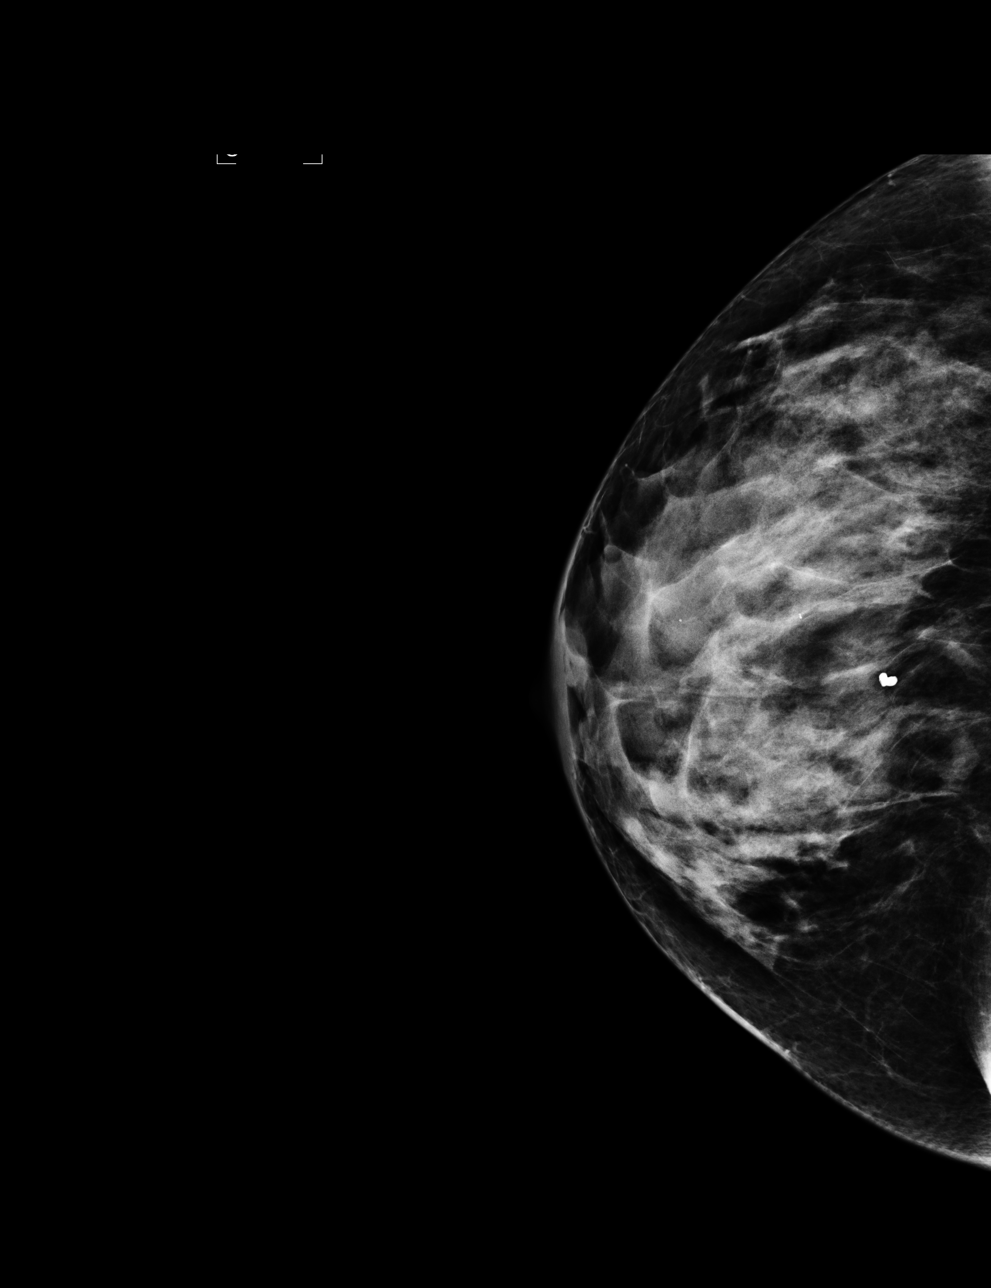

[R MLO]
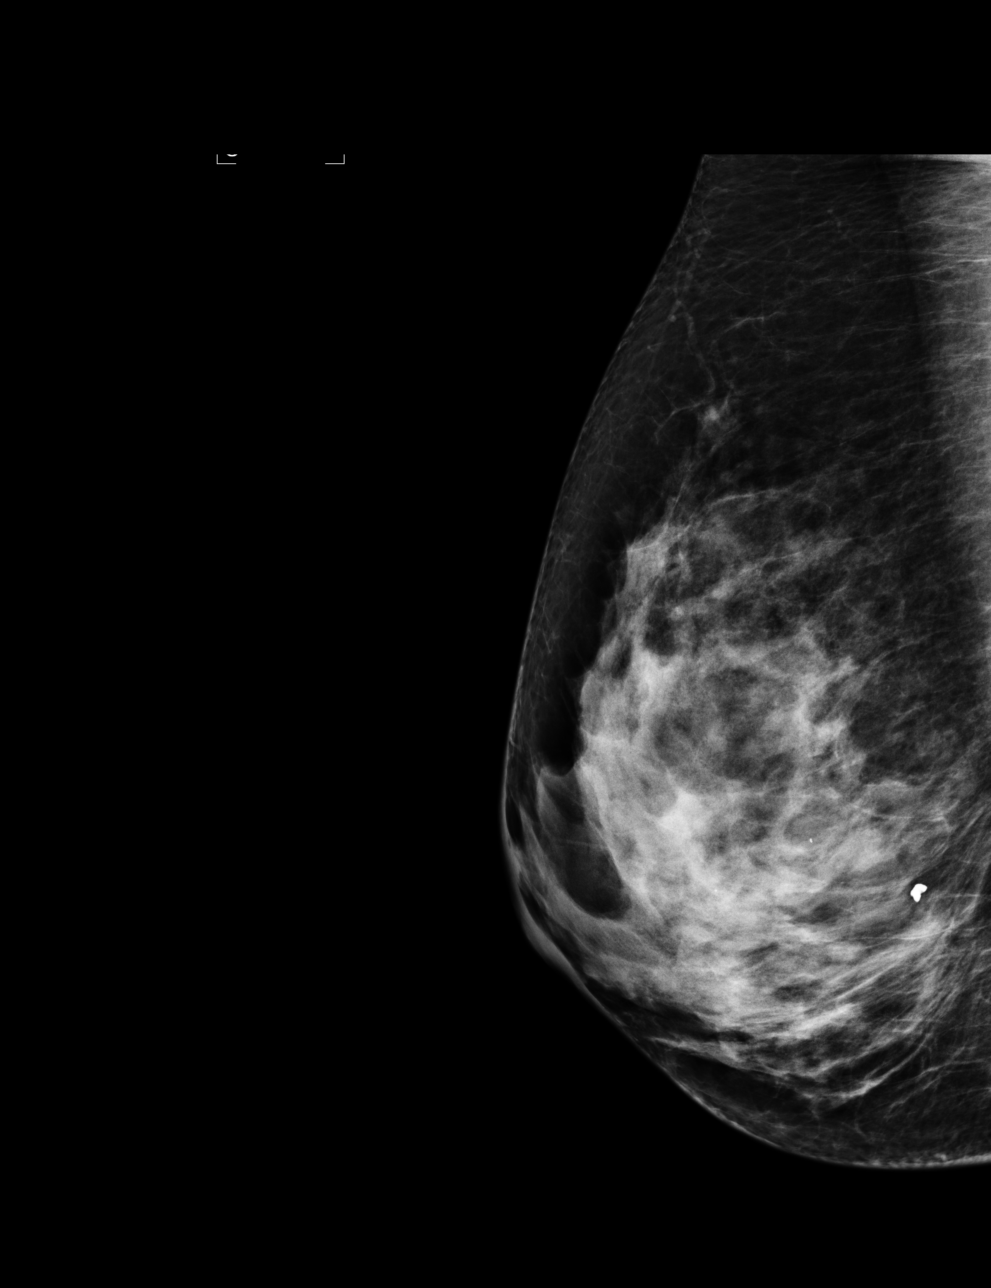

[4 of 4 positions shown; findings below may reference images not displayed]

ACR Breast Density Category c: The breast tissue is heterogeneously
dense, which may obscure small masses.
FINDINGS: There are no findings suspicious for malignancy. Images were
processed with CAD.
IMPRESSION: No mammographic evidence of malignancy. A result letter of this
screening mammogram will be mailed directly to the patient.

RECOMMENDATION:
Screening mammogram in one year. (Code:YJ-2-FEZ)

BI-RADS CATEGORY  1: Negative.

## 2015-03-25 ENCOUNTER — Encounter: Payer: Self-pay | Admitting: Family Medicine

## 2015-03-25 ENCOUNTER — Ambulatory Visit (INDEPENDENT_AMBULATORY_CARE_PROVIDER_SITE_OTHER): Payer: BC Managed Care – PPO | Admitting: Family Medicine

## 2015-03-25 VITALS — BP 108/64 | HR 78 | Temp 98.6°F | Resp 16 | Ht 65.0 in | Wt 149.0 lb

## 2015-03-25 DIAGNOSIS — B86 Scabies: Secondary | ICD-10-CM | POA: Diagnosis not present

## 2015-03-25 MED ORDER — PERMETHRIN 5 % EX CREA: 1.0000 "application " | TOPICAL_CREAM | Freq: Once | CUTANEOUS | Status: DC

## 2015-03-25 NOTE — Progress Notes (Signed)
   Subjective:    Patient ID: Jasmine Ruiz, female    DOB: 01-03-1956, 59 y.o.   MRN: [id number]  HPI   patient reports an itchy rash on her abdomen around her waistline of her undergarment. It has been there for approximately a week. They are 1 mm to 2 mm small erythematous papules with excoriations in a linear distribution across her waistline. Her mother has a similar rash all over her body at the nursing home. The rash is extremely itchy. Past Medical History  Diagnosis Date  . Insomnia     takes Ambien nightly as needed  . PONV (postoperative nausea and vomiting)   . History of bronchitis 07/2013  . Migraine     takes Relpax daily as needed;last migraine end of Feb 2015  . Arthritis   . Joint pain   . Plantar fasciitis   . Tendonitis     left should  . Joint swelling   . History of MRSA infection    Past Surgical History  Procedure Laterality Date  . Mouth surgery    . Knee arthroscopy Left 2014  . Abdominal hysterectomy    . Exploratory laparotomy    . Total knee arthroplasty Left 10/13/2013    Procedure: TOTAL KNEE ARTHROPLASTY;  Surgeon: Thera Flake., MD;  Location: [facility] OR;  Service: Orthopedics;  Laterality: Left;   Current Outpatient Prescriptions on File Prior to Visit  Medication Sig Dispense Refill  . Ascorbic Acid (VITAMIN C PO) Take by mouth.    . Multiple Vitamin (MULTIVITAMIN) tablet Take 1 tablet by mouth daily.    . RELPAX 40 MG tablet TAKE 1 TABLET BY MOUTH AS NEEDED MAY REPEAT IN 2 HOURS AS NEEDED FOR MIGRAINE. MAXIMUM 2 TABLETS/DAY. 52 tablet 0  . zolpidem (AMBIEN) 10 MG tablet Take 10 mg by mouth at bedtime as needed for sleep.     No current facility-administered medications on file prior to visit.   Allergies  Allergen Reactions  . Hydrocodone Anaphylaxis  . Percocet [Oxycodone-Acetaminophen] Anaphylaxis  . Prednisone Swelling  . Sulfa Antibiotics     unknown  . Vicodin [Hydrocodone-Acetaminophen] Nausea And Vomiting   Social History    Social History  . Marital Status: Married    Spouse Name: N/A  . Number of Children: N/A  . Years of Education: N/A   Occupational History  . Not on file.   Social History Main Topics  . Smoking status: Passive Smoke Exposure - Never Smoker  . Smokeless tobacco: Not on file  . Alcohol Use: No  . Drug Use: No  . Sexual Activity: Yes    Birth Control/ Protection: Surgical   Other Topics Concern  . Not on file   Social History Narrative     Review of Systems  All other systems reviewed and are negative.      Objective:   Physical Exam  Cardiovascular: Normal rate, regular rhythm and normal heart sounds.   Pulmonary/Chest: Effort normal and breath sounds normal. No respiratory distress. She has no wheezes. She has no rales.  Skin: Rash noted.  Vitals reviewed.         Assessment & Plan:  Scabies - Plan: permethrin (ELIMITE) 5 % cream   Elimite cream head to toe. Rinse off after 8 hours. Wash all linens in warm water

## 2015-03-26 ENCOUNTER — Ambulatory Visit (HOSPITAL_COMMUNITY)
Admission: RE | Admit: 2015-03-26 | Discharge: 2015-03-26 | Disposition: A | Discharge status: Home / Self Care | Payer: BC Managed Care – PPO | Source: Ambulatory Visit | Attending: Orthopedic Surgery | Admitting: Orthopedic Surgery

## 2015-03-26 DIAGNOSIS — M25512 Pain in left shoulder: Secondary | ICD-10-CM | POA: Diagnosis present

## 2015-03-26 DIAGNOSIS — M75102 Unspecified rotator cuff tear or rupture of left shoulder, not specified as traumatic: Secondary | ICD-10-CM | POA: Diagnosis not present

## 2015-03-26 DIAGNOSIS — M19012 Primary osteoarthritis, left shoulder: Secondary | ICD-10-CM | POA: Diagnosis not present

## 2015-03-26 DIAGNOSIS — S43432A Superior glenoid labrum lesion of left shoulder, initial encounter: Secondary | ICD-10-CM | POA: Diagnosis not present

## 2015-05-24 ENCOUNTER — Ambulatory Visit (INDEPENDENT_AMBULATORY_CARE_PROVIDER_SITE_OTHER): Payer: BC Managed Care – PPO | Admitting: Family Medicine

## 2015-05-24 ENCOUNTER — Encounter: Payer: Self-pay | Admitting: Family Medicine

## 2015-05-24 VITALS — BP 128/78 | HR 66 | Temp 98.0°F | Resp 14 | Ht 65.0 in | Wt 148.0 lb

## 2015-05-24 DIAGNOSIS — L309 Dermatitis, unspecified: Secondary | ICD-10-CM

## 2015-05-24 MED ORDER — CLOTRIMAZOLE-BETAMETHASONE 1-0.05 % EX CREA: 1.0000 "application " | TOPICAL_CREAM | Freq: Two times a day (BID) | CUTANEOUS | Status: DC

## 2015-05-24 NOTE — Patient Instructions (Signed)
Apply cream twice a day  F/U as needed

## 2015-05-24 NOTE — Progress Notes (Signed)
Patient ID: Jasmine Ruiz, female   DOB: 06/12/1956, 59 y.o.   MRN: [id number]   Subjective:    Patient ID: Jasmine Ruiz, female    DOB: 06/12/1956, 59 y.o.   MRN: [id number]  Patient presents for Skin Irritation  patient here with rash in her left axilla. She was treated for scabies back in August with permethrin, her parents are in nursing facilities. She had an intense itch at that time. She denies any change her deodorant or detergent recently has not had any rash otherwise in her body. She is not using any topicals. No recent illness She was just concerned this might be scabies again.    Review Of Systems: per above   GEN- denies fatigue, fever, weight loss,weakness, recent illness HEENT- denies eye drainage, change in vision, nasal discharge, CVS- denies chest pain, palpitations RESP- denies SOB, cough, wheeze ABD- denies N/V, change in stools, abd pain Neuro- denies headache, dizziness, syncope, seizure activity       Objective:    BP 128/78 mmHg  Pulse 66  Temp(Src) 98 F (36.7 C) (Oral)  Resp 14  Ht 5\' 5"  (1.651 m)  Wt 148 lb (67.132 kg)  BMI 24.63 kg/m2 GEN- NAD, alert and oriented x3 Skin- 2 small scaley erythematous cirular lesions in left axilla, NT, no induration, no other lesions on  Chest, back, arms,legs, abdomen  Nodes- no axillary nodes        Assessment & Plan:      Problem List Items Addressed This Visit    None    Visit Diagnoses    Dermatitis    -  Primary    Skin scraping, KOH done, no sign of mites no fungal elements seen, as in axilla with sweating treat with Lotrisone       Note: This dictation was prepared with Dragon dictation along with smaller phrase technology. Any transcriptional errors that result from this process are unintentional.

## 2015-06-28 ENCOUNTER — Ambulatory Visit (INDEPENDENT_AMBULATORY_CARE_PROVIDER_SITE_OTHER): Payer: BC Managed Care – PPO | Admitting: Family Medicine

## 2015-06-28 ENCOUNTER — Encounter: Payer: Self-pay | Admitting: Family Medicine

## 2015-06-28 VITALS — BP 108/64 | HR 88 | Temp 98.1°F | Resp 16 | Ht 65.0 in | Wt 151.0 lb

## 2015-06-28 DIAGNOSIS — J329 Chronic sinusitis, unspecified: Secondary | ICD-10-CM

## 2015-06-28 MED ORDER — AMOXICILLIN 875 MG PO TABS
875.0000 mg | ORAL_TABLET | Freq: Two times a day (BID) | ORAL | Status: DC
Start: 2015-06-28 — End: 2016-01-23

## 2015-06-28 NOTE — Progress Notes (Signed)
Subjective:    Patient ID: Jasmine Ruiz, female    DOB: 08/01/1955, 59 y.o.   MRN: [id number]  HPI Patient's father and husband  Have been sick with recent sinus infections. Beginning Tuesday, the patient developed similar symptoms. She reports clear rhinorrhea, nasal congestion, head congestion. She denies any fevers or chills. She denies any sinus pain. She denies a severe headache. She does report a scratchy throat secondary to postnasal drip. She denies any dental pain. She denies any shortness of breath or chest pain Past Medical History  Diagnosis Date  . Insomnia     takes Ambien nightly as needed  . PONV (postoperative nausea and vomiting)   . History of bronchitis 07/2013  . Migraine     takes Relpax daily as needed;last migraine end of Feb 2015  . Arthritis   . Joint pain   . Plantar fasciitis   . Tendonitis     left should  . Joint swelling   . History of MRSA infection    Past Surgical History  Procedure Laterality Date  . Mouth surgery    . Knee arthroscopy Left 2014  . Abdominal hysterectomy    . Exploratory laparotomy    . Total knee arthroplasty Left 10/13/2013    Procedure: TOTAL KNEE ARTHROPLASTY;  Surgeon: Yvette Rack., MD;  Location: [facility];  Service: Orthopedics;  Laterality: Left;   Current Outpatient Prescriptions on File Prior to Visit  Medication Sig Dispense Refill  . Ascorbic Acid (VITAMIN C PO) Take by mouth.    Marland Kitchen Bioflavonoid Products (BIOFLEX PO) Take by mouth.    . clotrimazole-betamethasone (LOTRISONE) cream Apply 1 application topically 2 (two) times daily. 45 g 0  . Multiple Vitamin (MULTIVITAMIN) tablet Take 1 tablet by mouth daily.    . RELPAX 40 MG tablet TAKE 1 TABLET BY MOUTH AS NEEDED MAY REPEAT IN 2 HOURS AS NEEDED FOR MIGRAINE. MAXIMUM 2 TABLETS/DAY. 52 tablet 0  . vitamin E 100 UNIT capsule Take by mouth daily.    Marland Kitchen zolpidem (AMBIEN) 10 MG tablet Take 10 mg by mouth at bedtime as needed for sleep.     No current  facility-administered medications on file prior to visit.   Allergies  Allergen Reactions  . Hydrocodone Anaphylaxis  . Percocet [Oxycodone-Acetaminophen] Anaphylaxis  . Prednisone Swelling  . Sulfa Antibiotics     unknown  . Vicodin [Hydrocodone-Acetaminophen] Nausea And Vomiting   Social History   Social History  . Marital Status: Married    Spouse Name: N/A  . Number of Children: N/A  . Years of Education: N/A   Occupational History  . Not on file.   Social History Main Topics  . Smoking status: Passive Smoke Exposure - Never Smoker  . Smokeless tobacco: Not on file  . Alcohol Use: No  . Drug Use: No  . Sexual Activity: Yes    Birth Control/ Protection: Surgical   Other Topics Concern  . Not on file   Social History Narrative      Review of Systems  All other systems reviewed and are negative.      Objective:   Physical Exam  Constitutional: She appears well-developed and well-nourished.  HENT:  Right Ear: Tympanic membrane, external ear and ear canal normal.  Left Ear: Tympanic membrane, external ear and ear canal normal.  Nose: Mucosal edema and rhinorrhea present. Right sinus exhibits no maxillary sinus tenderness and no frontal sinus tenderness. Left sinus exhibits no maxillary sinus tenderness and no  frontal sinus tenderness.  Mouth/Throat: Oropharynx is clear and moist. No oropharyngeal exudate.  Eyes: Conjunctivae are normal. Pupils are equal, round, and reactive to light. Right eye exhibits no discharge. Left eye exhibits no discharge.  Neck: Neck supple.  Cardiovascular: Normal rate, regular rhythm and normal heart sounds.   No murmur heard. Pulmonary/Chest: Effort normal and breath sounds normal. No respiratory distress. She has no wheezes. She has no rales.  Lymphadenopathy:    She has no cervical adenopathy.  Vitals reviewed.         Assessment & Plan:  Rhinosinusitis - Plan: amoxicillin (AMOXIL) 875 MG tablet   Patient symptoms are  more consistent with a viral upper respiratory infection. I recommended tincture of time. I asked the patient to give it 7-10 days total. Treat the symptoms with Sudafed, Zyrtec, and Afrin. Should spontaneously improve by next Tuesday. If symptoms worsen over the weekend and that she develops a high fever 101 or higher or severe sinus pain, I did give the patient prescription for amoxicillin  With strict instructions not to fill unless certain criteria are met. She agreed.

## 2015-09-18 ENCOUNTER — Ambulatory Visit: Payer: BC Managed Care – PPO | Admitting: Family Medicine

## 2016-01-23 ENCOUNTER — Other Ambulatory Visit (HOSPITAL_COMMUNITY)
Admission: RE | Admit: 2016-01-23 | Discharge: 2016-01-23 | Disposition: A | Discharge status: Home / Self Care | Payer: BC Managed Care – PPO | Source: Ambulatory Visit | Attending: Obstetrics & Gynecology | Admitting: Obstetrics & Gynecology

## 2016-01-23 ENCOUNTER — Ambulatory Visit (INDEPENDENT_AMBULATORY_CARE_PROVIDER_SITE_OTHER): Payer: BC Managed Care – PPO | Admitting: Obstetrics & Gynecology

## 2016-01-23 ENCOUNTER — Encounter: Payer: Self-pay | Admitting: Obstetrics & Gynecology

## 2016-01-23 VITALS — BP 120/70 | HR 80 | Ht 65.0 in | Wt 146.0 lb

## 2016-01-23 DIAGNOSIS — Z01419 Encounter for gynecological examination (general) (routine) without abnormal findings: Secondary | ICD-10-CM | POA: Insufficient documentation

## 2016-01-23 DIAGNOSIS — Z1212 Encounter for screening for malignant neoplasm of rectum: Secondary | ICD-10-CM | POA: Diagnosis not present

## 2016-01-23 DIAGNOSIS — Z1211 Encounter for screening for malignant neoplasm of colon: Secondary | ICD-10-CM

## 2016-01-23 NOTE — Addendum Note (Signed)
Addended by: Federico FlakeNES, PEGGY A on: 01/23/2016 03:47 PM   Modules accepted: Orders

## 2016-01-23 NOTE — Progress Notes (Signed)
Patient ID: Jasmine IonJayne B Schrupp, female   DOB: 12/19/1955, 60 y.o.   MRN: [id number] Subjective:     Jasmine Ruiz is a 60 y.o. female here for a routine exam.  No LMP recorded. Patient has had a hysterectomy. No obstetric history on file. Birth Control Method:  hysterectomy Menstrual Calendar(currently): amenorrheic  Current complaints: none.   Current acute medical issues:  none   Recent Gynecologic History No LMP recorded. Patient has had a hysterectomy. Last Pap: 2016,  normal Last mammogram: 02/20/2015,  normal  Past Medical History  Diagnosis Date  . Insomnia     takes Ambien nightly as needed  . PONV (postoperative nausea and vomiting)   . History of bronchitis 07/2013  . Migraine     takes Relpax daily as needed;last migraine end of Feb 2015  . Arthritis   . Joint pain   . Plantar fasciitis   . Tendonitis     left should  . Joint swelling   . History of MRSA infection     Past Surgical History  Procedure Laterality Date  . Mouth surgery    . Knee arthroscopy Left 2014  . Abdominal hysterectomy    . Exploratory laparotomy    . Total knee arthroplasty Left 10/13/2013    Procedure: TOTAL KNEE ARTHROPLASTY;  Surgeon: Thera FlakeW D Caffrey Jr., MD;  Location: [facility];  Service: Orthopedics;  Laterality: Left;    OB History    No data available      Social History   Social History  . Marital Status: Married    Spouse Name: N/A  . Number of Children: N/A  . Years of Education: N/A   Social History Main Topics  . Smoking status: Passive Smoke Exposure - Never Smoker  . Smokeless tobacco: None  . Alcohol Use: No  . Drug Use: No  . Sexual Activity: Yes    Birth Control/ Protection: Surgical   Other Topics Concern  . None   Social History Narrative    Family History  Problem Relation Age of Onset  . Diabetes Maternal Grandfather   . Hypertension Mother   . Alzheimer's disease Mother      Current outpatient prescriptions:  .  Ascorbic Acid (VITAMIN C PO), Take by  mouth., Disp: , Rfl:  .  Bioflavonoid Products (BIOFLEX PO), Take by mouth., Disp: , Rfl:  .  Multiple Vitamin (MULTIVITAMIN) tablet, Take 1 tablet by mouth daily., Disp: , Rfl:  .  RELPAX 40 MG tablet, TAKE 1 TABLET BY MOUTH AS NEEDED MAY REPEAT IN 2 HOURS AS NEEDED FOR MIGRAINE. MAXIMUM 2 TABLETS/DAY., Disp: 52 tablet, Rfl: 0 .  vitamin E 100 UNIT capsule, Take by mouth daily., Disp: , Rfl:  .  zolpidem (AMBIEN) 10 MG tablet, Take 10 mg by mouth at bedtime as needed for sleep., Disp: , Rfl:   Review of Systems  Review of Systems  Constitutional: Negative for fever, chills, weight loss, malaise/fatigue and diaphoresis.  HENT: Negative for hearing loss, ear pain, nosebleeds, congestion, sore throat, neck pain, tinnitus and ear discharge.   Eyes: Negative for blurred vision, double vision, photophobia, pain, discharge and redness.  Respiratory: Negative for cough, hemoptysis, sputum production, shortness of breath, wheezing and stridor.   Cardiovascular: Negative for chest pain, palpitations, orthopnea, claudication, leg swelling and PND.  Gastrointestinal: negative for abdominal pain. Negative for heartburn, nausea, vomiting, diarrhea, constipation, blood in stool and melena.  Genitourinary: Negative for dysuria, urgency, frequency, hematuria and flank pain.  Musculoskeletal: Negative  for myalgias, back pain, joint pain and falls.  Skin: Negative for itching and rash.  Neurological: Negative for dizziness, tingling, tremors, sensory change, speech change, focal weakness, seizures, loss of consciousness, weakness and headaches.  Endo/Heme/Allergies: Negative for environmental allergies and polydipsia. Does not bruise/bleed easily.  Psychiatric/Behavioral: Negative for depression, suicidal ideas, hallucinations, memory loss and substance abuse. The patient is not nervous/anxious and does not have insomnia.        Objective:  Blood pressure 120/70, pulse 80, height 5\' 5"  (1.651 m), weight 146  lb (66.225 kg).   Physical Exam  Vitals reviewed. Constitutional: She is oriented to person, place, and time. She appears well-developed and well-nourished.  HENT:  Head: Normocephalic and atraumatic.        Right Ear: External ear normal.  Left Ear: External ear normal.  Nose: Nose normal.  Mouth/Throat: Oropharynx is clear and moist.  Eyes: Conjunctivae and EOM are normal. Pupils are equal, round, and reactive to light. Right eye exhibits no discharge. Left eye exhibits no discharge. No scleral icterus.  Neck: Normal range of motion. Neck supple. No tracheal deviation present. No thyromegaly present.  Cardiovascular: Normal rate, regular rhythm, normal heart sounds and intact distal pulses.  Exam reveals no gallop and no friction rub.   No murmur heard. Respiratory: Effort normal and breath sounds normal. No respiratory distress. She has no wheezes. She has no rales. She exhibits no tenderness.  GI: Soft. Bowel sounds are normal. She exhibits no distension and no mass. There is no tenderness. There is no rebound and no guarding.  Genitourinary:  Breasts no masses skin changes or nipple changes bilaterally      Vulva is normal without lesions Vagina is pink moist without discharge Cervix normal in appearance and pap is done Uterus is normal size shape and contour Adnexa is negative with normal sized ovaries  {Rectal    hemoccult negative, normal tone, no masses  Musculoskeletal: Normal range of motion. She exhibits no edema and no tenderness.  Neurological: She is alert and oriented to person, place, and time. She has normal reflexes. She displays normal reflexes. No cranial nerve deficit. She exhibits normal muscle tone. Coordination normal.  Skin: Skin is warm and dry. No rash noted. No erythema. No pallor.  Psychiatric: She has a normal mood and affect. Her behavior is normal. Judgment and thought content normal.       Medications Ordered at today's visit: No orders of the defined  types were placed in this encounter.    Other orders placed at today's visit: No orders of the defined types were placed in this encounter.      Assessment:    Healthy female exam.    Plan:    Mammogram ordered. Follow up in: 1 year.     Return in about 1 year (around 01/22/2017) for yearly, with Dr Despina HiddenEure.

## 2016-01-24 ENCOUNTER — Other Ambulatory Visit: Payer: Self-pay | Admitting: Family Medicine

## 2016-01-24 MED ORDER — ELETRIPTAN HYDROBROMIDE 40 MG PO TABS: ORAL_TABLET | ORAL | Status: AC

## 2016-01-24 NOTE — Telephone Encounter (Signed)
Medication called/sent to requested pharmacy  

## 2016-01-27 LAB — CYTOLOGY - PAP

## 2016-02-12 ENCOUNTER — Other Ambulatory Visit: Payer: Self-pay | Admitting: Obstetrics & Gynecology

## 2016-02-12 DIAGNOSIS — Z1231 Encounter for screening mammogram for malignant neoplasm of breast: Secondary | ICD-10-CM

## 2016-02-24 ENCOUNTER — Ambulatory Visit (HOSPITAL_COMMUNITY)
Admission: RE | Admit: 2016-02-24 | Discharge: 2016-02-24 | Disposition: A | Discharge status: Home / Self Care | Payer: BC Managed Care – PPO | Source: Ambulatory Visit | Attending: Obstetrics & Gynecology | Admitting: Obstetrics & Gynecology

## 2016-02-24 DIAGNOSIS — Z1231 Encounter for screening mammogram for malignant neoplasm of breast: Secondary | ICD-10-CM | POA: Diagnosis not present

## 2016-02-25 ENCOUNTER — Ambulatory Visit (INDEPENDENT_AMBULATORY_CARE_PROVIDER_SITE_OTHER): Payer: BC Managed Care – PPO | Admitting: Family Medicine

## 2016-02-25 VITALS — BP 150/84 | HR 78 | Temp 98.2°F | Resp 16 | Ht 65.0 in | Wt 147.0 lb

## 2016-02-25 DIAGNOSIS — R42 Dizziness and giddiness: Secondary | ICD-10-CM

## 2016-02-25 NOTE — Progress Notes (Signed)
Subjective:    Patient ID: Jasmine Ruiz, female    DOB: June 15, 1956, 60 y.o.   MRN: [id number]  HPI patient is a very pleasant 60 year old white female who presents with gradually worsening dizziness. It is been going on now for several months. However the patient is becoming concerned because it is more often than not now. She denies any vertigo, tinnitus, or hearing loss. She denies any head trauma she does have a history of long-standing migraines and does become more frequent recently. She denies any chest pain shortness of breath or dyspnea on exertion. Reviewing her past medical records, she the patient did have postoperative anemia in 2015. This has not been checked since. Also her blood pressure today is elevated. The patient classically has low blood pressure. She is not sure if this is a fluke. Otherwise she's been doing well. Past Medical History:  Diagnosis Date  . Arthritis   . History of bronchitis 07/2013  . History of MRSA infection   . Insomnia    takes Ambien nightly as needed  . Joint pain   . Joint swelling   . Migraine    takes Relpax daily as needed;last migraine end of Feb 2015  . Plantar fasciitis   . PONV (postoperative nausea and vomiting)   . Tendonitis    left should   Past Surgical History:  Procedure Laterality Date  . ABDOMINAL HYSTERECTOMY    . EXPLORATORY LAPAROTOMY    . KNEE ARTHROSCOPY Left 2014  . MOUTH SURGERY    . TOTAL KNEE ARTHROPLASTY Left 10/13/2013   Procedure: TOTAL KNEE ARTHROPLASTY;  Surgeon: Thera Flake., MD;  Location: [facility];  Service: Orthopedics;  Laterality: Left;   Current Outpatient Prescriptions on File Prior to Visit  Medication Sig Dispense Refill  . Ascorbic Acid (VITAMIN C PO) Take by mouth.    Marland Kitchen Bioflavonoid Products (BIOFLEX PO) Take by mouth.    . eletriptan (RELPAX) 40 MG tablet TAKE 1 TABLET BY MOUTH AS NEEDED MAY REPEAT IN 2 HOURS AS NEEDED FOR MIGRAINE. MAXIMUM 2 TABLETS/DAY. 52 tablet 5  . Multiple Vitamin  (MULTIVITAMIN) tablet Take 1 tablet by mouth daily.    . vitamin E 100 UNIT capsule Take by mouth daily.    Marland Kitchen zolpidem (AMBIEN) 10 MG tablet Take 10 mg by mouth at bedtime as needed for sleep.     No current facility-administered medications on file prior to visit.    Allergies  Allergen Reactions  . Hydrocodone Anaphylaxis  . Percocet [Oxycodone-Acetaminophen] Anaphylaxis  . Prednisone Swelling  . Sulfa Antibiotics     unknown  . Vicodin [Hydrocodone-Acetaminophen] Nausea And Vomiting   Social History   Social History  . Marital status: Married    Spouse name: N/A  . Number of children: N/A  . Years of education: N/A   Occupational History  . Not on file.   Social History Main Topics  . Smoking status: Passive Smoke Exposure - Never Smoker  . Smokeless tobacco: Not on file  . Alcohol use No  . Drug use: No  . Sexual activity: Yes    Birth control/ protection: Surgical   Other Topics Concern  . Not on file   Social History Narrative  . No narrative on file     Review of Systems  All other systems reviewed and are negative.      Objective:   Physical Exam  Constitutional: She is oriented to person, place, and time. She appears well-developed and well-nourished.  HENT:  Right Ear: External ear normal.  Left Ear: External ear normal.  Mouth/Throat: Oropharynx is clear and moist. No oropharyngeal exudate.  Cardiovascular: Normal rate, regular rhythm, normal heart sounds and intact distal pulses.  Exam reveals no gallop and no friction rub.   No murmur heard. Pulmonary/Chest: Effort normal and breath sounds normal. No respiratory distress. She has no wheezes. She has no rales. She exhibits no tenderness.  Abdominal: Soft. Bowel sounds are normal. She exhibits no distension. There is no tenderness. There is no rebound and no guarding.  Neurological: She is alert and oriented to person, place, and time. She has normal reflexes. She displays normal reflexes. No  cranial nerve deficit. She exhibits normal muscle tone. Coordination normal.  Vitals reviewed.         Assessment & Plan:  Dizzinesses - Plan: CBC with Differential/Platelet, COMPLETE METABOLIC PANEL WITH GFR  Romberg's maneuver is normal. There is no evidence of any cerebellar dysfunction. The remainder of her neurologic exam is completely reassuring. 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 I will check a CBC to monitor for anemia along with a CMP to check for any late July dysfunction. I'm very concerned about her blood pressure. She will check her blood pressure frequently over the next week and notify me of the values. Perhaps this could be contributing some to her dizziness. I see no evidence of a stroke or any type of intracranial pathology on her neurologic exam. Should the symptoms worsen, we could proceed to an MRI but at the present time I believe this has likely low diagnostic yield.

## 2016-02-26 LAB — CBC WITH DIFFERENTIAL/PLATELET
BASOS ABS: 80 {cells}/uL (ref 0–200)
BASOS PCT: 1 %
EOS ABS: 80 {cells}/uL (ref 15–500)
Eosinophils Relative: 1 %
HEMATOCRIT: 40.9 % (ref 35.0–45.0)
HEMOGLOBIN: 13.6 g/dL (ref 12.0–15.0)
LYMPHS ABS: 3040 {cells}/uL (ref 850–3900)
Lymphocytes Relative: 38 %
MCH: 30.4 pg (ref 27.0–33.0)
MCHC: 33.3 g/dL (ref 32.0–36.0)
MCV: 91.3 fL (ref 80.0–100.0)
MONO ABS: 800 {cells}/uL (ref 200–950)
MPV: 9.8 fL (ref 7.5–12.5)
Monocytes Relative: 10 %
NEUTROS ABS: 4000 {cells}/uL (ref 1500–7800)
Neutrophils Relative %: 50 %
PLATELETS: 309 10*3/uL (ref 140–400)
RBC: 4.48 MIL/uL (ref 3.80–5.10)
RDW: 13.2 % (ref 11.0–15.0)
WBC: 8 10*3/uL (ref 3.8–10.8)

## 2016-02-26 LAB — COMPLETE METABOLIC PANEL WITH GFR
ALBUMIN: 4.3 g/dL (ref 3.6–5.1)
ALK PHOS: 59 U/L (ref 33–130)
ALT: 12 U/L (ref 6–29)
AST: 19 U/L (ref 10–35)
BILIRUBIN TOTAL: 0.4 mg/dL (ref 0.2–1.2)
BUN: 12 mg/dL (ref 7–25)
CALCIUM: 9.5 mg/dL (ref 8.6–10.4)
CO2: 28 mmol/L (ref 20–31)
CREATININE: 0.78 mg/dL (ref 0.50–0.99)
Chloride: 102 mmol/L (ref 98–110)
GFR, Est Non African American: 82 mL/min (ref >=60)
Glucose, Bld: 76 mg/dL (ref 70–99)
Potassium: 3.9 mmol/L (ref 3.5–5.3)
Sodium: 137 mmol/L (ref 135–146)
TOTAL PROTEIN: 7.1 g/dL (ref 6.1–8.1)

## 2016-03-02 ENCOUNTER — Telehealth: Payer: Self-pay | Admitting: Family Medicine

## 2016-03-02 NOTE — Telephone Encounter (Signed)
Those are great.   

## 2016-03-02 NOTE — Telephone Encounter (Signed)
Pt called with BP readings: 130/78,118/72,110/70,116/72,112/70,112/68

## 2016-08-07 ENCOUNTER — Encounter: Payer: Self-pay | Admitting: Family Medicine

## 2016-08-07 ENCOUNTER — Ambulatory Visit (INDEPENDENT_AMBULATORY_CARE_PROVIDER_SITE_OTHER): Payer: BC Managed Care – PPO | Admitting: Family Medicine

## 2016-08-07 VITALS — BP 120/80 | HR 78 | Temp 98.4°F | Resp 16 | Ht 65.0 in | Wt 151.0 lb

## 2016-08-07 DIAGNOSIS — J019 Acute sinusitis, unspecified: Secondary | ICD-10-CM | POA: Diagnosis not present

## 2016-08-07 MED ORDER — FLUTICASONE PROPIONATE 50 MCG/ACT NA SUSP: 2.0000 | Freq: Every day | NASAL | 6 refills | Status: DC

## 2016-08-07 MED ORDER — AMOXICILLIN 875 MG PO TABS: 875.0000 mg | ORAL_TABLET | Freq: Two times a day (BID) | ORAL | 0 refills | Status: DC

## 2016-08-07 NOTE — Progress Notes (Signed)
Subjective:    Patient ID: Jasmine Ruiz, female    DOB: 12/22/1955, 61 y.o.   MRN: [id number]  HPI Patient reports 2 weeks of rhinorrhea, head congestion, pain in her maxillary sinuses and in her nasal bridge. The pain is worse with percussion and palpation in that area. She reportsnasal drip and increased frequency of migraine headaches and frontal sinus headaches. She denies any cough or sore throat. She has subjective fevers although she is also suffering from hot flashes. Past Medical History:  Diagnosis Date  . Arthritis   . History of bronchitis 07/2013  . History of MRSA infection   . Insomnia    takes Ambien nightly as needed  . Joint pain   . Joint swelling   . Migraine    takes Relpax daily as needed;last migraine end of Feb 2015  . Plantar fasciitis   . PONV (postoperative nausea and vomiting)   . Tendonitis    left should   Past Surgical History:  Procedure Laterality Date  . ABDOMINAL HYSTERECTOMY    . EXPLORATORY LAPAROTOMY    . KNEE ARTHROSCOPY Left 2014  . MOUTH SURGERY    . TOTAL KNEE ARTHROPLASTY Left 10/13/2013   Procedure: TOTAL KNEE ARTHROPLASTY;  Surgeon: Thera Flake., MD;  Location: [facility];  Service: Orthopedics;  Laterality: Left;   Current Outpatient Prescriptions on File Prior to Visit  Medication Sig Dispense Refill  . Ascorbic Acid (VITAMIN C PO) Take by mouth.    Marland Kitchen Bioflavonoid Products (BIOFLEX PO) Take by mouth.    . eletriptan (RELPAX) 40 MG tablet TAKE 1 TABLET BY MOUTH AS NEEDED MAY REPEAT IN 2 HOURS AS NEEDED FOR MIGRAINE. MAXIMUM 2 TABLETS/DAY. 52 tablet 5  . Multiple Vitamin (MULTIVITAMIN) tablet Take 1 tablet by mouth daily.    . vitamin E 100 UNIT capsule Take by mouth daily.    Marland Kitchen zolpidem (AMBIEN) 10 MG tablet Take 10 mg by mouth at bedtime as needed for sleep.     No current facility-administered medications on file prior to visit.    Allergies  Allergen Reactions  . Hydrocodone Anaphylaxis  . Percocet [Oxycodone-Acetaminophen]  Anaphylaxis  . Prednisone Swelling  . Sulfa Antibiotics     unknown  . Vicodin [Hydrocodone-Acetaminophen] Nausea And Vomiting   Social History   Social History  . Marital status: Married    Spouse name: N/A  . Number of children: N/A  . Years of education: N/A   Occupational History  . Not on file.   Social History Main Topics  . Smoking status: Passive Smoke Exposure - Never Smoker  . Smokeless tobacco: Not on file  . Alcohol use No  . Drug use: No  . Sexual activity: Yes    Birth control/ protection: Surgical   Other Topics Concern  . Not on file   Social History Narrative  . No narrative on file      Review of Systems  All other systems reviewed and are negative.      Objective:   Physical Exam  HENT:  Right Ear: Tympanic membrane and ear canal normal.  Left Ear: Tympanic membrane and ear canal normal.  Nose: Mucosal edema and rhinorrhea present. Right sinus exhibits maxillary sinus tenderness and frontal sinus tenderness. Left sinus exhibits maxillary sinus tenderness and frontal sinus tenderness.  Eyes: Conjunctivae are normal.  Neck: Neck supple.  Cardiovascular: Normal rate, regular rhythm and normal heart sounds.   Pulmonary/Chest: Effort normal and breath sounds normal. No respiratory  distress. She has no wheezes. She has no rales.  Abdominal: Soft. Bowel sounds are normal. She exhibits no distension. There is no tenderness. There is no rebound.  Lymphadenopathy:    She has no cervical adenopathy.  Vitals reviewed.         Assessment & Plan:  Acute rhinosinusitis - Plan: fluticasone (FLONASE) 50 MCG/ACT nasal spray, amoxicillin (AMOXIL) 875 MG tablet  The patient has had her symptoms for more than 2 weeks. I'll treat the patient with amoxicillin 875 mg by mouth twice a day to 10 days in addition to Flonase 2 sprays each nostril daily. Recheck in one week if no better or sooner if worse

## 2016-11-05 ENCOUNTER — Encounter: Payer: Self-pay | Admitting: Physician Assistant

## 2016-11-05 ENCOUNTER — Ambulatory Visit (INDEPENDENT_AMBULATORY_CARE_PROVIDER_SITE_OTHER): Payer: BC Managed Care – PPO | Admitting: Physician Assistant

## 2016-11-05 VITALS — BP 102/78 | HR 72 | Temp 98.0°F | Resp 14 | Wt 149.6 lb

## 2016-11-05 DIAGNOSIS — H5712 Ocular pain, left eye: Secondary | ICD-10-CM

## 2016-11-05 NOTE — Progress Notes (Signed)
Patient ID: Jasmine Ruiz MRN: [id number], DOB: Mar 26, 1956, 61 y.o. Date of Encounter: 11/05/2016, 2:43 PM    Chief Complaint:  Chief Complaint  Patient presents with  . left eye pain and red     HPI: 61 y.o. year old female presents with above.   Says that her left eye has been a little red and uncomfortable. Says "it did look worse than this ". Says it has felt a little bit of a burning discomfort also an uncomfortable discomfort and also says at times it has felt like pressure and tightness. Says that she wanted to come in to "find out whether it was allergies or pink eye because she is going to be with her grandchildren this weekend and if it is something contagious wanted to get it treated so she is not spreading it." Says that she does not usually have problems with allergies.Also, she is not mentioning any itching. Also, only left eye is affected. Says that she is having no mucousy drainage from the eye. Is having no crust on the eyelashes or other areas when she wakes up in the morning.     Home Meds:   Outpatient Medications Prior to Visit  Medication Sig Dispense Refill  . Bioflavonoid Products (BIOFLEX PO) Take by mouth.    . eletriptan (RELPAX) 40 MG tablet TAKE 1 TABLET BY MOUTH AS NEEDED MAY REPEAT IN 2 HOURS AS NEEDED FOR MIGRAINE. MAXIMUM 2 TABLETS/DAY. 52 tablet 5  . fluticasone (FLONASE) 50 MCG/ACT nasal spray Place 2 sprays into both nostrils daily. 16 g 6  . Multiple Vitamin (MULTIVITAMIN) tablet Take 1 tablet by mouth daily.    . vitamin E 100 UNIT capsule Take by mouth daily.    Marland Kitchen zolpidem (AMBIEN) 10 MG tablet Take 10 mg by mouth at bedtime as needed for sleep.    Marland Kitchen amoxicillin (AMOXIL) 875 MG tablet Take 1 tablet (875 mg total) by mouth 2 (two) times daily. 20 tablet 0  . Ascorbic Acid (VITAMIN C PO) Take by mouth.     No facility-administered medications prior to visit.     Allergies:  Allergies  Allergen Reactions  . Hydrocodone Anaphylaxis  .  Percocet [Oxycodone-Acetaminophen] Anaphylaxis  . Prednisone Swelling  . Sulfa Antibiotics     unknown  . Vicodin [Hydrocodone-Acetaminophen] Nausea And Vomiting      Review of Systems: See HPI for pertinent ROS. All other ROS negative.    Physical Exam: Blood pressure 102/78, pulse 72, temperature 98 F (36.7 C), temperature source Oral, resp. rate 14, weight 149 lb 9.6 oz (67.9 kg), SpO2 98 %., Body mass index is 24.89 kg/m. General: WNWD WF.  Appears in no acute distress. HEENT: Normocephalic, atraumatic, eyes without discharge, left eye with mild pink conjunctival injection. No drainage present.  Neck: Supple. No thyromegaly. No lymphadenopathy. Lungs: Clear bilaterally to auscultation without wheezes, rales, or rhonchi. Breathing is unlabored. Heart: Regular rhythm. No murmurs, rubs, or gallops. Msk:  Strength and tone normal for age. Extremities/Skin: Warm and dry.  Neuro: Alert and oriented X 3. Moves all extremities spontaneously. Gait is normal. CNII-XII grossly in tact. Psych:  Responds to questions appropriately with a normal affect.     ASSESSMENT AND PLAN:  61 y.o. year old female with  1. Left eye pain I feel she needs to see Ophthalmology to r/o narrow angle glaucoma--given red, painful eye and lack of other signs/symptoms of allergies or conjunctivitis.  She has seen [facility] in the past so  we called them and they will see her now--to be there at 3:15 appt.  Pt voice understanding and agrees to go directly there.  - Ambulatory referral to Ophthalmology   Signed, [facility] [place], [place], [facility] 11/05/2016 2:43 PM

## 2017-01-22 ENCOUNTER — Other Ambulatory Visit: Payer: Self-pay | Admitting: Obstetrics & Gynecology

## 2017-01-22 DIAGNOSIS — Z1231 Encounter for screening mammogram for malignant neoplasm of breast: Secondary | ICD-10-CM

## 2017-01-25 ENCOUNTER — Ambulatory Visit (INDEPENDENT_AMBULATORY_CARE_PROVIDER_SITE_OTHER): Payer: BC Managed Care – PPO | Admitting: Obstetrics & Gynecology

## 2017-01-25 ENCOUNTER — Encounter: Payer: Self-pay | Admitting: Obstetrics & Gynecology

## 2017-01-25 VITALS — BP 112/64 | HR 68 | Ht 64.0 in | Wt 149.0 lb

## 2017-01-25 DIAGNOSIS — Z1211 Encounter for screening for malignant neoplasm of colon: Secondary | ICD-10-CM | POA: Diagnosis not present

## 2017-01-25 DIAGNOSIS — Z01419 Encounter for gynecological examination (general) (routine) without abnormal findings: Secondary | ICD-10-CM

## 2017-01-25 DIAGNOSIS — Z1212 Encounter for screening for malignant neoplasm of rectum: Secondary | ICD-10-CM | POA: Diagnosis not present

## 2017-01-25 NOTE — Progress Notes (Signed)
Subjective:     Jasmine Ruiz is a 61 y.o. female here for a routine exam.  No LMP recorded. Patient has had a hysterectomy. No obstetric history on file. Birth Control Method:  Laparoscopic supracervical hysterectomy with BSO Menstrual Calendar(currently): amenorrheic  Current complaints: left knee acting up.   Current acute medical issues:  Left knee   Recent Gynecologic History No LMP recorded. Patient has had a hysterectomy. Last Pap: 2017,  normal Last mammogram: 01/2016,  normal  Past Medical History:  Diagnosis Date  . Arthritis   . History of bronchitis 07/2013  . History of MRSA infection   . Insomnia    takes Ambien nightly as needed  . Joint pain   . Joint swelling   . Migraine    takes Relpax daily as needed;last migraine end of Feb 2015  . Plantar fasciitis   . PONV (postoperative nausea and vomiting)   . Tendonitis    left should    Past Surgical History:  Procedure Laterality Date  . ABDOMINAL HYSTERECTOMY    . EXPLORATORY LAPAROTOMY    . KNEE ARTHROSCOPY Left 2014  . MOUTH SURGERY    . TOTAL KNEE ARTHROPLASTY Left 10/13/2013   Procedure: TOTAL KNEE ARTHROPLASTY;  Surgeon: Thera Flake., MD;  Location: [facility] OR;  Service: Orthopedics;  Laterality: Left;    OB History    No data available      Social History   Social History  . Marital status: Married    Spouse name: N/A  . Number of children: N/A  . Years of education: N/A   Social History Main Topics  . Smoking status: Passive Smoke Exposure - Never Smoker  . Smokeless tobacco: Never Used  . Alcohol use No  . Drug use: No  . Sexual activity: Yes    Birth control/ protection: Surgical   Other Topics Concern  . None   Social History Narrative  . None    Family History  Problem Relation Age of Onset  . Diabetes Maternal Grandfather   . Hypertension Mother   . Alzheimer's disease Mother      Current Outpatient Prescriptions:  .  Bioflavonoid Products (BIOFLEX PO), Take by mouth.,  Disp: , Rfl:  .  eletriptan (RELPAX) 40 MG tablet, TAKE 1 TABLET BY MOUTH AS NEEDED MAY REPEAT IN 2 HOURS AS NEEDED FOR MIGRAINE. MAXIMUM 2 TABLETS/DAY., Disp: 52 tablet, Rfl: 5 .  fluticasone (FLONASE) 50 MCG/ACT nasal spray, Place 2 sprays into both nostrils daily., Disp: 16 g, Rfl: 6 .  Multiple Vitamin (MULTIVITAMIN) tablet, Take 1 tablet by mouth daily., Disp: , Rfl:  .  vitamin E 100 UNIT capsule, Take by mouth daily., Disp: , Rfl:  .  zolpidem (AMBIEN) 10 MG tablet, Take 10 mg by mouth at bedtime as needed for sleep., Disp: , Rfl:   Review of Systems  Review of Systems  Constitutional: Negative for fever, chills, weight loss, malaise/fatigue and diaphoresis.  HENT: Negative for hearing loss, ear pain, nosebleeds, congestion, sore throat, neck pain, tinnitus and ear discharge.   Eyes: Negative for blurred vision, double vision, photophobia, pain, discharge and redness.  Respiratory: Negative for cough, hemoptysis, sputum production, shortness of breath, wheezing and stridor.   Cardiovascular: Negative for chest pain, palpitations, orthopnea, claudication, leg swelling and PND.  Gastrointestinal: negative for abdominal pain. Negative for heartburn, nausea, vomiting, diarrhea, constipation, blood in stool and melena.  Genitourinary: Negative for dysuria, urgency, frequency, hematuria and flank pain.  Musculoskeletal: Negative for  myalgias, back pain, joint pain and falls.  Skin: Negative for itching and rash.  Neurological: Negative for dizziness, tingling, tremors, sensory change, speech change, focal weakness, seizures, loss of consciousness, weakness and headaches.  Endo/Heme/Allergies: Negative for environmental allergies and polydipsia. Does not bruise/bleed easily.  Psychiatric/Behavioral: Negative for depression, suicidal ideas, hallucinations, memory loss and substance abuse. The patient is not nervous/anxious and does not have insomnia.        Objective:  Blood pressure 112/64,  pulse 68, height 5\' 4"  (1.626 m), weight 149 lb (67.6 kg).   Physical Exam  Vitals reviewed. Constitutional: She is oriented to person, place, and time. She appears well-developed and well-nourished.  HENT:  Head: Normocephalic and atraumatic.        Right Ear: External ear normal.  Left Ear: External ear normal.  Nose: Nose normal.  Mouth/Throat: Oropharynx is clear and moist.  Eyes: Conjunctivae and EOM are normal. Pupils are equal, round, and reactive to light. Right eye exhibits no discharge. Left eye exhibits no discharge. No scleral icterus.  Neck: Normal range of motion. Neck supple. No tracheal deviation present. No thyromegaly present.  Cardiovascular: Normal rate, regular rhythm, normal heart sounds and intact distal pulses.  Exam reveals no gallop and no friction rub.   No murmur heard. Respiratory: Effort normal and breath sounds normal. No respiratory distress. She has no wheezes. She has no rales. She exhibits no tenderness.  GI: Soft. Bowel sounds are normal. She exhibits no distension and no mass. There is no tenderness. There is no rebound and no guarding.  Genitourinary:  Breasts no masses skin changes or nipple changes bilaterally      Vulva is normal without lesions Vagina is pink moist without discharge Cervix normal in appearance and pap is not done Uterus is normal size shape and contour Adnexa is negative with normal sized ovaries  {Rectal    hemoccult negative, normal tone, no masses  Musculoskeletal: Normal range of motion. She exhibits no edema and no tenderness.  Neurological: She is alert and oriented to person, place, and time. She has normal reflexes. She displays normal reflexes. No cranial nerve deficit. She exhibits normal muscle tone. Coordination normal.  Skin: Skin is warm and dry. No rash noted. No erythema. No pallor.  Psychiatric: She has a normal mood and affect. Her behavior is normal. Judgment and thought content normal.       Medications  Ordered at today's visit: No orders of the defined types were placed in this encounter.   Other orders placed at today's visit: No orders of the defined types were placed in this encounter.     Assessment:    Healthy female exam.    Plan:    Contraception: status post hysterectomy. Mammogram ordered. Follow up in: 2 years.     Return in about 2 years (around 01/26/2019) for yearly, with Dr Despina HiddenEure.

## 2017-02-24 ENCOUNTER — Ambulatory Visit (HOSPITAL_COMMUNITY)
Admission: RE | Admit: 2017-02-24 | Discharge: 2017-02-24 | Disposition: A | Discharge status: Home / Self Care | Payer: BC Managed Care – PPO | Source: Ambulatory Visit | Attending: Obstetrics & Gynecology | Admitting: Obstetrics & Gynecology

## 2017-02-24 DIAGNOSIS — Z1231 Encounter for screening mammogram for malignant neoplasm of breast: Secondary | ICD-10-CM | POA: Diagnosis not present

## 2017-03-11 ENCOUNTER — Encounter: Payer: Self-pay | Admitting: Physician Assistant

## 2017-03-11 ENCOUNTER — Ambulatory Visit (INDEPENDENT_AMBULATORY_CARE_PROVIDER_SITE_OTHER): Payer: BC Managed Care – PPO | Admitting: Physician Assistant

## 2017-03-11 VITALS — BP 110/70 | HR 88 | Temp 98.0°F | Resp 16 | Wt 148.8 lb

## 2017-03-11 DIAGNOSIS — J029 Acute pharyngitis, unspecified: Secondary | ICD-10-CM | POA: Diagnosis not present

## 2017-03-11 LAB — STREP GROUP A AG, W/REFLEX TO CULT: STREGTOCOCCUS GROUP A AG SCREEN: NOT DETECTED

## 2017-03-11 NOTE — Progress Notes (Signed)
Patient ID: Jasmine Ruiz MRN: [id number], DOB: 1956/02/10, 61 y.o. Date of Encounter: 03/11/2017, 2:51 PM    Chief Complaint:  Chief Complaint  Patient presents with  . Sore Throat    x1day     HPI: 61 y.o. year old female presents with above.   Patient states that the main reason she came in to get checked so quickly is that today is her daughter's birthday. However her daughter has cancer and so her immune system is low. Patient wanted to come and get checked before she is around her daughter to see if she is contagious.  Patient reports that yesterday she had a little bit of sneezing and feeling like she was clearing out her throat. States that last night her throat was sore and this morning it was sore but it is better now. Says actually it was just the right side of her throat that was sore. Says that her nose was also stopped up on the right side. States that she has been gargling with warm salt water, has been using honey, lozenges.  Says that they also have been spraying tobacco near her house and didn't know if that spray had irritated her throat.  Her daughter has been at the beach but is returning today she did know whether it was safe to be around her daughter or not.  She has had no chest congestion/deep cough. Has had no fever. No earache.     Home Meds:   Outpatient Medications Prior to Visit  Medication Sig Dispense Refill  . Bioflavonoid Products (BIOFLEX PO) Take by mouth.    . eletriptan (RELPAX) 40 MG tablet TAKE 1 TABLET BY MOUTH AS NEEDED MAY REPEAT IN 2 HOURS AS NEEDED FOR MIGRAINE. MAXIMUM 2 TABLETS/DAY. 52 tablet 5  . fluticasone (FLONASE) 50 MCG/ACT nasal spray Place 2 sprays into both nostrils daily. 16 g 6  . Multiple Vitamin (MULTIVITAMIN) tablet Take 1 tablet by mouth daily.    . vitamin E 100 UNIT capsule Take by mouth daily.    Marland Kitchen. zolpidem (AMBIEN) 10 MG tablet Take 10 mg by mouth at bedtime as needed for sleep.     No facility-administered  medications prior to visit.     Allergies:  Allergies  Allergen Reactions  . Hydrocodone Anaphylaxis  . Percocet [Oxycodone-Acetaminophen] Anaphylaxis  . Prednisone Swelling  . Sulfa Antibiotics     unknown  . Vicodin [Hydrocodone-Acetaminophen] Nausea And Vomiting      Review of Systems: See HPI for pertinent ROS. All other ROS negative.    Physical Exam: Blood pressure 110/70, pulse 88, temperature 98 F (36.7 C), temperature source Oral, resp. rate 16, weight 148 lb 12.8 oz (67.5 kg), SpO2 98 %., Body mass index is 25.54 kg/m. General:  WNWD WF. Appears in no acute distress. HEENT: Normocephalic, atraumatic, eyes without discharge, sclera non-icteric, nares are without discharge. Bilateral auditory canals clear, TM's are without perforation, pearly grey and translucent with reflective cone of light bilaterally. Oral cavity moist, posterior pharynx with mild erythema-- right posterior pharynx does have more erythema than left side. No  Exudate. No peritonsillar abscess.  Neck: Supple. No thyromegaly. No lymphadenopathy. I feel no enlarged nodes and she reports that nodes are not tender with palpation either. Lungs: Clear bilaterally to auscultation without wheezes, rales, or rhonchi. Breathing is unlabored. Heart: Regular rhythm. No murmurs, rubs, or gallops. Msk:  Strength and tone normal for age. Extremities/Skin: Warm and dry. No rashes. Neuro: Alert and oriented X  3. Moves all extremities spontaneously. Gait is normal. CNII-XII grossly in tact. Psych:  Responds to questions appropriately with a normal affect.   Results for orders placed or performed in visit on 03/11/17  STREP GROUP A AG, W/REFLEX TO CULT  Result Value Ref Range   SOURCE THROAT    STREGTOCOCCUS GROUP A AG SCREEN Not Detected      ASSESSMENT AND PLAN:  61 y.o. year old female with   1. Viral pharyngitis  2. Sore throat - STREP GROUP A AG, W/REFLEX TO CULT   Discussed that strep test  negative. Discussed that this could possibly be related to allergies but more likely sounds like viral pharyngitis. Recommend she avoid contact with her daughter until symptoms resolve. In the meantime she can use over-the-counter treatment for symptom relief. Lozenges or sprays to help numb/dull the pain. Also can use over-the-counter Tylenol to dull the pain. If symptoms worsen significantly or persist greater than 7 days then follow up. - STREP GROUP A AG, W/REFLEX TO CULT   Signed, [address] [place], [place], [facility] 03/11/2017 2:51 PM

## 2017-03-12 LAB — CULTURE, GROUP A STREP

## 2017-08-09 ENCOUNTER — Other Ambulatory Visit: Payer: Self-pay | Admitting: Family Medicine

## 2017-08-09 DIAGNOSIS — J019 Acute sinusitis, unspecified: Secondary | ICD-10-CM

## 2017-08-09 MED ORDER — FLUTICASONE PROPIONATE 50 MCG/ACT NA SUSP: 2.0000 | Freq: Every day | NASAL | 6 refills | Status: DC

## 2018-04-28 ENCOUNTER — Ambulatory Visit: Payer: BC Managed Care – PPO | Admitting: Family Medicine

## 2018-05-30 ENCOUNTER — Encounter (INDEPENDENT_AMBULATORY_CARE_PROVIDER_SITE_OTHER): Payer: Self-pay

## 2018-05-30 ENCOUNTER — Encounter: Payer: Self-pay | Admitting: Obstetrics & Gynecology

## 2018-05-30 ENCOUNTER — Ambulatory Visit: Payer: BC Managed Care – PPO | Admitting: Obstetrics & Gynecology

## 2018-05-30 VITALS — BP 127/73 | HR 72 | Ht 64.0 in | Wt 147.0 lb

## 2018-05-30 DIAGNOSIS — Z1212 Encounter for screening for malignant neoplasm of rectum: Secondary | ICD-10-CM

## 2018-05-30 DIAGNOSIS — Z1211 Encounter for screening for malignant neoplasm of colon: Secondary | ICD-10-CM

## 2018-05-30 DIAGNOSIS — Z01419 Encounter for gynecological examination (general) (routine) without abnormal findings: Secondary | ICD-10-CM | POA: Diagnosis not present

## 2018-05-30 NOTE — Progress Notes (Signed)
Subjective:     Jasmine Ruiz is a 62 y.o. female here for a routine exam.  No LMP recorded. Patient has had a hysterectomy. No obstetric history on file. Birth Control Method:  hysterectomy Menstrual Calendar(currently):   Current complaints: left knee.   Current acute medical issues:     Recent Gynecologic History No LMP recorded. Patient has had a hysterectomy. Last Pap: ,   Last mammogram: 02/2017,  normal  Past Medical History:  Diagnosis Date  . Arthritis   . History of bronchitis 07/2013  . History of MRSA infection   . Insomnia    takes Ambien nightly as needed  . Joint pain   . Joint swelling   . Migraine    takes Relpax daily as needed;last migraine end of Feb 2015  . Plantar fasciitis   . PONV (postoperative nausea and vomiting)   . Tendonitis    left should    Past Surgical History:  Procedure Laterality Date  . ABDOMINAL HYSTERECTOMY    . EXPLORATORY LAPAROTOMY    . KNEE ARTHROSCOPY Left 2014  . MOUTH SURGERY    . TOTAL KNEE ARTHROPLASTY Left 10/13/2013   Procedure: TOTAL KNEE ARTHROPLASTY;  Surgeon: Thera Flake., MD;  Location: [facility] OR;  Service: Orthopedics;  Laterality: Left;    OB History   None     Social History   Socioeconomic History  . Marital status: Married    Spouse name: Not on file  . Number of children: Not on file  . Years of education: Not on file  . Highest education level: Not on file  Occupational History  . Not on file  Social Needs  . Financial resource strain: Not on file  . Food insecurity:    Worry: Not on file    Inability: Not on file  . Transportation needs:    Medical: Not on file    Non-medical: Not on file  Tobacco Use  . Smoking status: Passive Smoke Exposure - Never Smoker  . Smokeless tobacco: Never Used  Substance and Sexual Activity  . Alcohol use: No  . Drug use: No  . Sexual activity: Yes    Birth control/protection: Surgical  Lifestyle  . Physical activity:    Days per week: Not on file   Minutes per session: Not on file  . Stress: Not on file  Relationships  . Social connections:    Talks on phone: Not on file    Gets together: Not on file    Attends religious service: Not on file    Active member of club or organization: Not on file    Attends meetings of clubs or organizations: Not on file    Relationship status: Not on file  Other Topics Concern  . Not on file  Social History Narrative  . Not on file    Family History  Problem Relation Age of Onset  . Diabetes Maternal Grandfather   . Hypertension Mother   . Alzheimer's disease Mother      Current Outpatient Medications:  .  Bioflavonoid Products (BIOFLEX PO), Take by mouth., Disp: , Rfl:  .  eletriptan (RELPAX) 40 MG tablet, TAKE 1 TABLET BY MOUTH AS NEEDED MAY REPEAT IN 2 HOURS AS NEEDED FOR MIGRAINE. MAXIMUM 2 TABLETS/DAY., Disp: 52 tablet, Rfl: 5 .  Melatonin 2.5 MG CAPS, Take by mouth., Disp: , Rfl:  .  Multiple Vitamin (MULTIVITAMIN) tablet, Take 1 tablet by mouth daily., Disp: , Rfl:  .  vitamin  E 100 UNIT capsule, Take by mouth daily., Disp: , Rfl:  .  fluticasone (FLONASE) 50 MCG/ACT nasal spray, Place 2 sprays into both nostrils daily. (Patient not taking: Reported on 05/30/2018), Disp: 16 g, Rfl: 6 .  zolpidem (AMBIEN) 10 MG tablet, Take 10 mg by mouth at bedtime as needed for sleep., Disp: , Rfl:   Review of Systems  Review of Systems  Constitutional: Negative for fever, chills, weight loss, malaise/fatigue and diaphoresis.  HENT: Negative for hearing loss, ear pain, nosebleeds, congestion, sore throat, neck pain, tinnitus and ear discharge.   Eyes: Negative for blurred vision, double vision, photophobia, pain, discharge and redness.  Respiratory: Negative for cough, hemoptysis, sputum production, shortness of breath, wheezing and stridor.   Cardiovascular: Negative for chest pain, palpitations, orthopnea, claudication, leg swelling and PND.  Gastrointestinal: negative for abdominal pain.  Negative for heartburn, nausea, vomiting, diarrhea, constipation, blood in stool and melena.  Genitourinary: Negative for dysuria, urgency, frequency, hematuria and flank pain.  Musculoskeletal: Negative for myalgias, back pain, joint pain and falls.  Skin: Negative for itching and rash.  Neurological: Negative for dizziness, tingling, tremors, sensory change, speech change, focal weakness, seizures, loss of consciousness, weakness and headaches.  Endo/Heme/Allergies: Negative for environmental allergies and polydipsia. Does not bruise/bleed easily.  Psychiatric/Behavioral: Negative for depression, suicidal ideas, hallucinations, memory loss and substance abuse. The patient is not nervous/anxious and does not have insomnia.        Objective:  Blood pressure 127/73, pulse 72, height 5\' 4"  (1.626 m), weight 147 lb (66.7 kg).   Physical Exam  Vitals reviewed. Constitutional: She is oriented to person, place, and time. She appears well-developed and well-nourished.  HENT:  Head: Normocephalic and atraumatic.        Right Ear: External ear normal.  Left Ear: External ear normal.  Nose: Nose normal.  Mouth/Throat: Oropharynx is clear and moist.  Eyes: Conjunctivae and EOM are normal. Pupils are equal, round, and reactive to light. Right eye exhibits no discharge. Left eye exhibits no discharge. No scleral icterus.  Neck: Normal range of motion. Neck supple. No tracheal deviation present. No thyromegaly present.  Cardiovascular: Normal rate, regular rhythm, normal heart sounds and intact distal pulses.  Exam reveals no gallop and no friction rub.   No murmur heard. Respiratory: Effort normal and breath sounds normal. No respiratory distress. She has no wheezes. She has no rales. She exhibits no tenderness.  GI: Soft. Bowel sounds are normal. She exhibits no distension and no mass. There is no tenderness. There is no rebound and no guarding.  Genitourinary:  Breasts no masses skin changes or  nipple changes bilaterally      Vulva is normal without lesions Vagina is pink moist without discharge Cervix absent Uterus is absent Adnexa is absent  {Rectal    hemoccult negative, normal tone, no masses  Musculoskeletal: Normal range of motion. She exhibits no edema and no tenderness.  Neurological: She is alert and oriented to person, place, and time. She has normal reflexes. She displays normal reflexes. No cranial nerve deficit. She exhibits normal muscle tone. Coordination normal.  Skin: Skin is warm and dry. No rash noted. No erythema. No pallor.  Psychiatric: She has a normal mood and affect. Her behavior is normal. Judgment and thought content normal.       Medications Ordered at today's visit: No orders of the defined types were placed in this encounter.   Other orders placed at today's visit: No orders of the defined types  were placed in this encounter.     Assessment:    Healthy female exam.    Plan:    Mammogram ordered. Follow up in: 2 years.     Return in about 2 years (around 05/30/2020) for yearly.

## 2018-11-17 ENCOUNTER — Other Ambulatory Visit: Payer: Self-pay

## 2018-11-17 ENCOUNTER — Ambulatory Visit (INDEPENDENT_AMBULATORY_CARE_PROVIDER_SITE_OTHER): Payer: BC Managed Care – PPO | Admitting: Family Medicine

## 2018-11-17 DIAGNOSIS — J329 Chronic sinusitis, unspecified: Secondary | ICD-10-CM

## 2018-11-17 MED ORDER — LEVOCETIRIZINE DIHYDROCHLORIDE 5 MG PO TABS: 5.0000 mg | ORAL_TABLET | Freq: Every evening | ORAL | 0 refills | Status: DC

## 2018-11-17 MED ORDER — FLUTICASONE PROPIONATE 50 MCG/ACT NA SUSP: 2.0000 | Freq: Every day | NASAL | 6 refills | Status: DC

## 2018-11-17 MED ORDER — AMOXICILLIN 875 MG PO TABS: 875.0000 mg | ORAL_TABLET | Freq: Two times a day (BID) | ORAL | 0 refills | Status: DC

## 2018-11-17 NOTE — Progress Notes (Signed)
Subjective:    Patient ID: Jasmine Ruiz, female    DOB: 1956/01/14, 63 y.o.   MRN: [id number]  HPI  Patient is being seen today over the telephone.  She consents to be seen by telephone.  Patient is currently at home.  I am currently my office.  Phone call began at 912.  Phone call ended at 925. Patient states that for 1 week, she has had pressure between her eyes and in the center of her head.  She is having postnasal drip causing tightness in her throat.  She is having pressure in both ears.  Both ears feel clogged.  When she leans forward, she feels pressure in her forehead.  She also has some dizziness.  Symptoms have gradually worsened over the last week.  She is not taking anything for allergies. Past Medical History:  Diagnosis Date  . Arthritis   . History of bronchitis 07/2013  . History of MRSA infection   . Insomnia    takes Ambien nightly as needed  . Joint pain   . Joint swelling   . Migraine    takes Relpax daily as needed;last migraine end of Feb 2015  . Plantar fasciitis   . PONV (postoperative nausea and vomiting)   . Tendonitis    left should   Past Surgical History:  Procedure Laterality Date  . ABDOMINAL HYSTERECTOMY    . EXPLORATORY LAPAROTOMY    . KNEE ARTHROSCOPY Left 2014  . MOUTH SURGERY    . TOTAL KNEE ARTHROPLASTY Left 10/13/2013   Procedure: TOTAL KNEE ARTHROPLASTY;  Surgeon: Thera FlakeW D Caffrey Jr., MD;  Location: [facility] OR;  Service: Orthopedics;  Laterality: Left;   Current Outpatient Medications on File Prior to Visit  Medication Sig Dispense Refill  . Bioflavonoid Products (BIOFLEX PO) Take by mouth.    . eletriptan (RELPAX) 40 MG tablet TAKE 1 TABLET BY MOUTH AS NEEDED MAY REPEAT IN 2 HOURS AS NEEDED FOR MIGRAINE. MAXIMUM 2 TABLETS/DAY. 52 tablet 5  . fluticasone (FLONASE) 50 MCG/ACT nasal spray Place 2 sprays into both nostrils daily. (Patient not taking: Reported on 05/30/2018) 16 g 6  . Melatonin 2.5 MG CAPS Take by mouth.    . Multiple Vitamin  (MULTIVITAMIN) tablet Take 1 tablet by mouth daily.    . vitamin E 100 UNIT capsule Take by mouth daily.    Marland Kitchen. zolpidem (AMBIEN) 10 MG tablet Take 10 mg by mouth at bedtime as needed for sleep.     No current facility-administered medications on file prior to visit.    Allergies  Allergen Reactions  . Hydrocodone Anaphylaxis  . Percocet [Oxycodone-Acetaminophen] Anaphylaxis  . Prednisone Swelling  . Sulfa Antibiotics     unknown  . Vicodin [Hydrocodone-Acetaminophen] Nausea And Vomiting   Social History   Socioeconomic History  . Marital status: Married    Spouse name: Not on file  . Number of children: Not on file  . Years of education: Not on file  . Highest education level: Not on file  Occupational History  . Not on file  Social Needs  . Financial resource strain: Not on file  . Food insecurity:    Worry: Not on file    Inability: Not on file  . Transportation needs:    Medical: Not on file    Non-medical: Not on file  Tobacco Use  . Smoking status: Passive Smoke Exposure - Never Smoker  . Smokeless tobacco: Never Used  Substance and Sexual Activity  . Alcohol use:  No  . Drug use: No  . Sexual activity: Yes    Birth control/protection: Surgical  Lifestyle  . Physical activity:    Days per week: Not on file    Minutes per session: Not on file  . Stress: Not on file  Relationships  . Social connections:    Talks on phone: Not on file    Gets together: Not on file    Attends religious service: Not on file    Active member of club or organization: Not on file    Attends meetings of clubs or organizations: Not on file    Relationship status: Not on file  . Intimate partner violence:    Fear of current or ex partner: Not on file    Emotionally abused: Not on file    Physically abused: Not on file    Forced sexual activity: Not on file  Other Topics Concern  . Not on file  Social History Narrative  . Not on file      Review of Systems  All other systems  reviewed and are negative.      Objective:   Physical Exam Physical exam could not be performed as the patient was seen over the telephone       Assessment & Plan:  Rhinosinusitis - Plan: fluticasone (FLONASE) 50 MCG/ACT nasal spray, levocetirizine (XYZAL) 5 MG tablet, amoxicillin (AMOXIL) 875 MG tablet  Phone call began at 912.  Phone call ended at 925.  Symptoms are most consistent with rhinosinusitis.  I suspect for allergies.  I will treat the patient for allergies with Flonase 2 sprays each nostril daily and Xyzal 5 mg daily.  If symptoms worsen over the next 3 to 4 days, I would then add amoxicillin 875 mg p.o. twice daily for 10 days for possible secondary sinusitis.

## 2018-12-09 ENCOUNTER — Other Ambulatory Visit: Payer: Self-pay | Admitting: Family Medicine

## 2018-12-09 DIAGNOSIS — J329 Chronic sinusitis, unspecified: Secondary | ICD-10-CM

## 2018-12-09 DIAGNOSIS — J310 Chronic rhinitis: Secondary | ICD-10-CM

## 2019-01-06 ENCOUNTER — Other Ambulatory Visit: Payer: Self-pay | Admitting: Family Medicine

## 2019-01-06 DIAGNOSIS — J329 Chronic sinusitis, unspecified: Secondary | ICD-10-CM

## 2019-01-06 DIAGNOSIS — J310 Chronic rhinitis: Secondary | ICD-10-CM

## 2019-02-07 ENCOUNTER — Telehealth: Payer: Self-pay | Admitting: Obstetrics & Gynecology

## 2019-02-07 NOTE — Telephone Encounter (Signed)
yes

## 2019-02-07 NOTE — Telephone Encounter (Signed)
Patient called, she is requesting a P/P with you only.  Is it ok to schedule with you?  [phone]

## 2019-02-10 ENCOUNTER — Other Ambulatory Visit: Payer: Self-pay | Admitting: Family Medicine

## 2019-02-10 DIAGNOSIS — J310 Chronic rhinitis: Secondary | ICD-10-CM

## 2019-02-10 DIAGNOSIS — J329 Chronic sinusitis, unspecified: Secondary | ICD-10-CM

## 2019-02-10 MED ORDER — LEVOCETIRIZINE DIHYDROCHLORIDE 5 MG PO TABS: ORAL_TABLET | ORAL | 3 refills | Status: DC

## 2019-02-20 ENCOUNTER — Other Ambulatory Visit: Payer: BC Managed Care – PPO | Admitting: Obstetrics & Gynecology

## 2019-03-02 ENCOUNTER — Telehealth: Payer: Self-pay | Admitting: Obstetrics & Gynecology

## 2019-03-02 NOTE — Telephone Encounter (Signed)

## 2019-03-06 ENCOUNTER — Encounter: Payer: Self-pay | Admitting: Obstetrics & Gynecology

## 2019-03-06 ENCOUNTER — Other Ambulatory Visit (HOSPITAL_COMMUNITY)
Admission: RE | Admit: 2019-03-06 | Discharge: 2019-03-06 | Disposition: A | Discharge status: Home / Self Care | Payer: BC Managed Care – PPO | Source: Ambulatory Visit | Attending: Obstetrics & Gynecology | Admitting: Obstetrics & Gynecology

## 2019-03-06 ENCOUNTER — Other Ambulatory Visit: Payer: Self-pay

## 2019-03-06 ENCOUNTER — Ambulatory Visit (INDEPENDENT_AMBULATORY_CARE_PROVIDER_SITE_OTHER): Payer: BC Managed Care – PPO | Admitting: Obstetrics & Gynecology

## 2019-03-06 VITALS — BP 131/78 | HR 82 | Ht 64.0 in | Wt 151.0 lb

## 2019-03-06 DIAGNOSIS — Z1212 Encounter for screening for malignant neoplasm of rectum: Secondary | ICD-10-CM

## 2019-03-06 DIAGNOSIS — Z01419 Encounter for gynecological examination (general) (routine) without abnormal findings: Secondary | ICD-10-CM | POA: Insufficient documentation

## 2019-03-06 DIAGNOSIS — Z1211 Encounter for screening for malignant neoplasm of colon: Secondary | ICD-10-CM

## 2019-03-06 NOTE — Progress Notes (Signed)
Subjective:     Jasmine Ruiz is a 63 y.o. female here for a routine exam.  No LMP recorded. Patient has had a hysterectomy. No obstetric history on file. Birth Control Method:  Supracervical hysterectomy Menstrual Calendar(currently): amenorrhea  Current complaints: none.   Current acute medical issues:  none   Recent Gynecologic History No LMP recorded. Patient has had a hysterectomy. Last Pap: 2017,  normal Last mammogram: 2018,  normal  Past Medical History:  Diagnosis Date  . Arthritis   . History of bronchitis 07/2013  . History of MRSA infection   . Insomnia    takes Ambien nightly as needed  . Joint pain   . Joint swelling   . Migraine    takes Relpax daily as needed;last migraine end of Feb 2015  . Plantar fasciitis   . PONV (postoperative nausea and vomiting)   . Tendonitis    left should  . Vertigo     Past Surgical History:  Procedure Laterality Date  . ABDOMINAL HYSTERECTOMY    . EXPLORATORY LAPAROTOMY    . KNEE ARTHROSCOPY Left 2014  . MOUTH SURGERY    . TOTAL KNEE ARTHROPLASTY Left 10/13/2013   Procedure: TOTAL KNEE ARTHROPLASTY;  Surgeon: Thera FlakeW D Caffrey Jr., MD;  Location: [facility] OR;  Service: Orthopedics;  Laterality: Left;    OB History   No obstetric history on file.     Social History   Socioeconomic History  . Marital status: Married    Spouse name: Not on file  . Number of children: Not on file  . Years of education: Not on file  . Highest education level: Not on file  Occupational History  . Not on file  Social Needs  . Financial resource strain: Not on file  . Food insecurity    Worry: Not on file    Inability: Not on file  . Transportation needs    Medical: Not on file    Non-medical: Not on file  Tobacco Use  . Smoking status: Passive Smoke Exposure - Never Smoker  . Smokeless tobacco: Never Used  Substance and Sexual Activity  . Alcohol use: No  . Drug use: No  . Sexual activity: Yes    Birth control/protection: Surgical   Lifestyle  . Physical activity    Days per week: Not on file    Minutes per session: Not on file  . Stress: Not on file  Relationships  . Social Musicianconnections    Talks on phone: Not on file    Gets together: Not on file    Attends religious service: Not on file    Active member of club or organization: Not on file    Attends meetings of clubs or organizations: Not on file    Relationship status: Not on file  Other Topics Concern  . Not on file  Social History Narrative  . Not on file    Family History  Problem Relation Age of Onset  . Diabetes Maternal Grandfather   . Hypertension Mother   . Alzheimer's disease Mother      Current Outpatient Medications:  .  Bioflavonoid Products (BIOFLEX PO), Take by mouth., Disp: , Rfl:  .  eletriptan (RELPAX) 40 MG tablet, TAKE 1 TABLET BY MOUTH AS NEEDED MAY REPEAT IN 2 HOURS AS NEEDED FOR MIGRAINE. MAXIMUM 2 TABLETS/DAY., Disp: 52 tablet, Rfl: 5 .  Melatonin 2.5 MG CAPS, Take by mouth., Disp: , Rfl:  .  Multiple Vitamin (MULTIVITAMIN) tablet, Take 1 tablet  by mouth daily., Disp: , Rfl:  .  vitamin E 100 UNIT capsule, Take by mouth daily., Disp: , Rfl:  .  zolpidem (AMBIEN) 10 MG tablet, Take 10 mg by mouth at bedtime as needed for sleep., Disp: , Rfl:  .  fluticasone (FLONASE) 50 MCG/ACT nasal spray, Place 2 sprays into both nostrils daily. (Patient not taking: Reported on 03/06/2019), Disp: 16 g, Rfl: 6 .  levocetirizine (XYZAL) 5 MG tablet, TAKE 1 TABLET BY MOUTH EVERY DAY IN THE EVENING (Patient not taking: Reported on 03/06/2019), Disp: 90 tablet, Rfl: 3  Review of Systems  Review of Systems  Constitutional: Negative for fever, chills, weight loss, malaise/fatigue and diaphoresis.  HENT: Negative for hearing loss, ear pain, nosebleeds, congestion, sore throat, neck pain, tinnitus and ear discharge.   Eyes: Negative for blurred vision, double vision, photophobia, pain, discharge and redness.  Respiratory: Negative for cough,  hemoptysis, sputum production, shortness of breath, wheezing and stridor.   Cardiovascular: Negative for chest pain, palpitations, orthopnea, claudication, leg swelling and PND.  Gastrointestinal: negative for abdominal pain. Negative for heartburn, nausea, vomiting, diarrhea, constipation, blood in stool and melena.  Genitourinary: Negative for dysuria, urgency, frequency, hematuria and flank pain.  Musculoskeletal: Negative for myalgias, back pain, joint pain and falls.  Skin: Negative for itching and rash.  Neurological: Negative for dizziness, tingling, tremors, sensory change, speech change, focal weakness, seizures, loss of consciousness, weakness and headaches.  Endo/Heme/Allergies: Negative for environmental allergies and polydipsia. Does not bruise/bleed easily.  Psychiatric/Behavioral: Negative for depression, suicidal ideas, hallucinations, memory loss and substance abuse. The patient is not nervous/anxious and does not have insomnia.        Objective:  Blood pressure 131/78, pulse 82, height 5\' 4"  (1.626 m), weight 151 lb (68.5 kg).   Physical Exam  Vitals reviewed. Constitutional: She is oriented to person, place, and time. She appears well-developed and well-nourished.  HENT:  Head: Normocephalic and atraumatic.        Right Ear: External ear normal.  Left Ear: External ear normal.  Nose: Nose normal.  Mouth/Throat: Oropharynx is clear and moist.  Eyes: Conjunctivae and EOM are normal. Pupils are equal, round, and reactive to light. Right eye exhibits no discharge. Left eye exhibits no discharge. No scleral icterus.  Neck: Normal range of motion. Neck supple. No tracheal deviation present. No thyromegaly present.  Cardiovascular: Normal rate, regular rhythm, normal heart sounds and intact distal pulses.  Exam reveals no gallop and no friction rub.   No murmur heard. Respiratory: Effort normal and breath sounds normal. No respiratory distress. She has no wheezes. She has no  rales. She exhibits no tenderness.  GI: Soft. Bowel sounds are normal. She exhibits no distension and no mass. There is no tenderness. There is no rebound and no guarding.  Genitourinary:  Breasts no masses skin changes or nipple changes bilaterally      Vulva is normal without lesions Vagina is pink moist without discharge Cervix normal in appearance and pap is done Uterus is normal size shape and contour Adnexa is negative with normal sized ovaries  {Rectal    hemoccult negative, normal tone, no masses  Musculoskeletal: Normal range of motion. She exhibits no edema and no tenderness.  Neurological: She is alert and oriented to person, place, and time. She has normal reflexes. She displays normal reflexes. No cranial nerve deficit. She exhibits normal muscle tone. Coordination normal.  Skin: Skin is warm and dry. No rash noted. No erythema. No pallor.  Psychiatric:  She has a normal mood and affect. Her behavior is normal. Judgment and thought content normal.       Medications Ordered at today's visit: No orders of the defined types were placed in this encounter.   Other orders placed at today's visit: No orders of the defined types were placed in this encounter.     Assessment:    Healthy female exam.    Plan:    Mammogram ordered. Follow up in: 3 years.     Return in about 3 years (around 03/05/2022) for yearly, with Dr Despina HiddenEure.

## 2019-03-06 NOTE — Addendum Note (Signed)
Addended by: Diona Fanti A on: 03/06/2019 04:34 PM   Modules accepted: Orders

## 2019-03-08 LAB — CYTOLOGY - PAP
Diagnosis: NEGATIVE
HPV: NOT DETECTED

## 2019-03-13 ENCOUNTER — Other Ambulatory Visit (HOSPITAL_COMMUNITY): Payer: Self-pay | Admitting: Obstetrics & Gynecology

## 2019-03-13 DIAGNOSIS — Z1231 Encounter for screening mammogram for malignant neoplasm of breast: Secondary | ICD-10-CM

## 2019-03-17 ENCOUNTER — Telehealth: Payer: Self-pay | Admitting: Obstetrics & Gynecology

## 2019-03-17 NOTE — Telephone Encounter (Signed)
Pt called said when she was here at her appointment she was speaking with him about her dizziness and vertigo and he told her she needed to see a neurologist . [facility] called but they require a referral. Can we send a referral to Dr Jannifer Franklin at [facility].   Thanks

## 2019-03-20 ENCOUNTER — Other Ambulatory Visit: Payer: Self-pay | Admitting: *Deleted

## 2019-03-20 DIAGNOSIS — R42 Dizziness and giddiness: Secondary | ICD-10-CM

## 2019-03-22 ENCOUNTER — Other Ambulatory Visit: Payer: Self-pay

## 2019-03-22 ENCOUNTER — Ambulatory Visit (HOSPITAL_COMMUNITY)
Admission: RE | Admit: 2019-03-22 | Discharge: 2019-03-22 | Disposition: A | Discharge status: Home / Self Care | Payer: BC Managed Care – PPO | Source: Ambulatory Visit | Attending: Obstetrics & Gynecology | Admitting: Obstetrics & Gynecology

## 2019-03-22 DIAGNOSIS — Z1231 Encounter for screening mammogram for malignant neoplasm of breast: Secondary | ICD-10-CM | POA: Diagnosis not present

## 2019-04-13 ENCOUNTER — Other Ambulatory Visit: Payer: Self-pay

## 2019-04-13 ENCOUNTER — Ambulatory Visit (INDEPENDENT_AMBULATORY_CARE_PROVIDER_SITE_OTHER): Payer: Medicare Other | Admitting: Neurology

## 2019-04-13 ENCOUNTER — Encounter: Payer: Self-pay | Admitting: Neurology

## 2019-04-13 VITALS — BP 126/74 | HR 80 | Temp 97.7°F | Ht 64.5 in | Wt 152.0 lb

## 2019-04-13 DIAGNOSIS — H9312 Tinnitus, left ear: Secondary | ICD-10-CM | POA: Diagnosis not present

## 2019-04-13 DIAGNOSIS — R270 Ataxia, unspecified: Secondary | ICD-10-CM

## 2019-04-13 DIAGNOSIS — R0981 Nasal congestion: Secondary | ICD-10-CM

## 2019-04-13 DIAGNOSIS — R42 Dizziness and giddiness: Secondary | ICD-10-CM | POA: Diagnosis not present

## 2019-04-13 DIAGNOSIS — G3281 Cerebellar ataxia in diseases classified elsewhere: Secondary | ICD-10-CM

## 2019-04-13 MED ORDER — FEXOFENADINE HCL 60 MG PO TABS: 60.0000 mg | ORAL_TABLET | Freq: Two times a day (BID) | ORAL | 5 refills | Status: DC

## 2019-04-13 NOTE — Patient Instructions (Signed)
Vertigo and A Dizziness Dizziness is a common problem. It makes you feel unsteady or light-headed. You may feel like you are about to pass out (faint). Dizziness can lead to getting hurt if you stumble or fall. Dizziness can be caused by many things, including:  Medicines.  Not having enough water in your body (dehydration).  Illness. Follow these instructions at home: Eating and drinking   Drink enough fluid to keep your pee (urine) clear or pale yellow. This helps to keep you from getting dehydrated. Try to drink more clear fluids, such as water.  Do not drink alcohol.  Limit how much caffeine you drink or eat, if your doctor tells you to do that.  Limit how much salt (sodium) you drink or eat, if your doctor tells you to do that. Activity   Avoid making quick movements. ? When you stand up from sitting in a chair, steady yourself until you feel okay. ? In the morning, first sit up on the side of the bed. When you feel okay, stand slowly while you hold onto something. Do this until you know that your balance is fine.  If you need to stand in one place for a long time, move your legs often. Tighten and relax the muscles in your legs while you are standing.  Do not drive or use heavy machinery if you feel dizzy.  Avoid bending down if you feel dizzy. Place items in your home so you can reach them easily without leaning over. Lifestyle  Do not use any products that contain nicotine or tobacco, such as cigarettes and e-cigarettes. If you need help quitting, ask your doctor.  Try to lower your stress level. You can do this by using methods such as yoga or meditation. Talk with your doctor if you need help. General instructions  Watch your dizziness for any changes.  Take over-the-counter and prescription medicines only as told by your doctor. Talk with your doctor if you think that you are dizzy because of a medicine that you are taking.  Tell a friend or a family member  that you are feeling dizzy. If he or she notices any changes in your behavior, have this person call your doctor.  Keep all follow-up visits as told by your doctor. This is important. Contact a doctor if:  Your dizziness does not go away.  Your dizziness or light-headedness gets worse.  You feel sick to your stomach (nauseous).  You have trouble hearing.  You have new symptoms.  You are unsteady on your feet.  You feel like the room is spinning. Get help right away if:  You throw up (vomit) or have watery poop (diarrhea), and you cannot eat or drink anything.  You have trouble: ? Talking. ? Walking. ? Swallowing. ? Using your arms, hands, or legs.  You feel generally weak.  You are not thinking clearly, or you have trouble forming sentences. A friend or family member may notice this.  You have: ? Chest pain. ? Pain in your belly (abdomen). ? Shortness of breath. ? Sweating.  Your vision changes.  You are bleeding.  You have a very bad headache.  You have neck pain or a stiff neck.  You have a fever. These symptoms may be an emergency. Do not wait to see if the symptoms will go away. Get medical help right away. Call your local emergency services (911 in the [place] Do not drive yourself to the hospital. Summary  Dizziness makes you feel  unsteady or light-headed. You may feel like you are about to pass out (faint).  Drink enough fluid to keep your pee (urine) clear or pale yellow. Do not drink alcohol.  Avoid making quick movements if you feel dizzy.  Watch your dizziness for any changes. This information is not intended to replace advice given to you by your health care provider. Make sure you discuss any questions you have with your health care provider. Document Released: 07/02/2011 Document Revised: 07/16/2017 Document Reviewed: 07/30/2016 Elsevier Patient Education  2020 [employer].  Brain MRI , rule out cerebrovascular disorder, schwannoma  etc.  PT and Vestibular therapy at [facility] Start Allegra or Claritin, one at night po. Try for 30 days OTC.

## 2019-04-13 NOTE — Progress Notes (Signed)
Provider:  Melvyn Novasarmen  Janesa Dockery, M D  Referring Provider: Donita BrooksPickard, Warren T, MD Primary Care Physician:  Donita BrooksPickard, Warren T, MD  Chief Complaint  Patient presents with  . New Patient (Initial Visit)    pt alone, rm 10. pt states for years she has been complaining of dizzy like spells. states she can't pin point it to any particular movements or positional change. her father struggled with vertigo. she states she notices when she closes her eyes her balance is off. no images of brain completed and no therapy completed.     HPI:  Jasmine Ruiz is a 63 y.o. female patient  seen upon referral  from Dr. Tanya NonesPickard for a vertigo work up on 04-13-2019.   Jasmine Ruiz is a 63 year old right-handed Caucasian female with a history of progressive vertigo over the years.  She first presented several years ago with a vertigo sensation to her primary care physician who at the time found no evidence of any other neurologic or auditory dysfunction and meanwhile she has learned to live with it.  She also reports that her father struggled with vertigo which she called being swimmy headed, brain floating.  She describes a sensation as if she would be on a swing set with longer and longer swings until the swing turns over.  The sensation of her brain floating or falling is how she experiences her specific kind of vertigo.  My nurse has meanwhile done a orthostatic blood pressure evaluation supine blood pressure was 121/68 mmHg with a heart rate of 69 her seated blood pressure was 138/70 with a heart rate of 72 and regular her standing blood pressure was 126/74 mmHg with 80 bpm heart rate is regular.  Between supine and seated position the patient became dizzy but not between seated and standing. she felt today was a mild day.  When lying down she noted a 20-30 second delay before the vertigo manifests.  She can avoid this sensation by having the head of bed elevated.    Family history: Her father underwent the Epply  maneuver several times, was a patient of Dr. Anne HahnWillis and died in 2017 at [id number], mother died in 2017 at age 65. She is married, has adult Museum/gallery conservatorstepdaughter and 2 grandchildren. 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  Non smoker, non drinker. Retired. Coffee , but not soda.   Review of Systems: Out of a complete 14 system review, the patient complains of only the following symptoms, and all other reviewed systems are negative.  Vertigo.without Nausea.  No longer migraines.   Social History   Socioeconomic History  . Marital status: Married    Spouse name: Not on file  . Number of children: Not on file  . Years of education: Not on file  . Highest education level: Not on file  Occupational History  . Not on file  Social Needs  . Financial resource strain: Not on file  . Food insecurity    Worry: Not on file    Inability: Not on file  . Transportation needs    Medical: Not on file    Non-medical: Not on file  Tobacco Use  . Smoking status: Passive Smoke Exposure - Never Smoker  . Smokeless tobacco: Never Used  Substance and Sexual Activity  . Alcohol use: No  . Drug use: No  . Sexual activity: Yes    Birth control/protection: Surgical  Lifestyle  . Physical activity    Days per week: Not on  file    Minutes per session: Not on file  . Stress: Not on file  Relationships  . Social Musician on phone: Not on file    Gets together: Not on file    Attends religious service: Not on file    Active member of club or organization: Not on file    Attends meetings of clubs or organizations: Not on file    Relationship status: Not on file  . Intimate partner violence    Fear of current or ex partner: Not on file    Emotionally abused: Not on file    Physically abused: Not on file    Forced sexual activity: Not on file  Other Topics Concern  . Not on file  Social History Narrative  . Not on file    Family History  Problem Relation Age of Onset  . Diabetes Maternal  Grandfather   . Hypertension Mother   . Alzheimer's disease Mother     Past Medical History:  Diagnosis Date  . Arthritis   . History of bronchitis 07/2013  . History of MRSA infection   . Insomnia    takes Ambien nightly as needed  . Joint pain   . Joint swelling   . Migraine    takes Relpax daily as needed;last migraine end of Feb 2015  . Plantar fasciitis   . PONV (postoperative nausea and vomiting)   . Tendonitis    left should  . Vertigo     Past Surgical History:  Procedure Laterality Date  . ABDOMINAL HYSTERECTOMY    . EXPLORATORY LAPAROTOMY    . KNEE ARTHROSCOPY Left 2014  . MOUTH SURGERY    . TOTAL KNEE ARTHROPLASTY Left 10/13/2013   Procedure: TOTAL KNEE ARTHROPLASTY;  Surgeon: Thera Flake., MD;  Location: [facility] OR;  Service: Orthopedics;  Laterality: Left;    Current Outpatient Medications  Medication Sig Dispense Refill  . Bioflavonoid Products (BIOFLEX PO) Take by mouth.    . eletriptan (RELPAX) 40 MG tablet TAKE 1 TABLET BY MOUTH AS NEEDED MAY REPEAT IN 2 HOURS AS NEEDED FOR MIGRAINE. MAXIMUM 2 TABLETS/DAY. 52 tablet 5  . Melatonin 10 MG TABS Take 10 mg by mouth.     . Multiple Vitamin (MULTIVITAMIN) tablet Take 1 tablet by mouth daily.    . vitamin E 100 UNIT capsule Take by mouth daily.    Marland Kitchen zolpidem (AMBIEN) 10 MG tablet Take 10 mg by mouth at bedtime as needed for sleep.     No current facility-administered medications for this visit.     Allergies as of 04/13/2019 - Review Complete 04/13/2019  Allergen Reaction Noted  . Hydrocodone Anaphylaxis 10/03/2013  . Percocet [oxycodone-acetaminophen] Anaphylaxis 09/29/2013  . Prednisone Swelling 09/29/2013  . Sulfa antibiotics  02/28/2012  . Vicodin [hydrocodone-acetaminophen] Nausea And Vomiting 02/28/2012    Vitals: BP 126/74 (BP Location: Left Arm, Patient Position: Standing)   Pulse 80   Temp 97.7 F (36.5 C)   Ht 5' 4.5" (1.638 m)   Wt 152 lb (68.9 kg)   BMI 25.69 kg/m  Last Weight:  Wt  Readings from Last 1 Encounters:  04/13/19 152 lb (68.9 kg)   Last Height:   Ht Readings from Last 1 Encounters:  04/13/19 5' 4.5" (1.638 m)    Physical exam:  General: The patient is awake, alert and appears not in acute distress. The patient is well groomed. Head: Normocephalic, atraumatic. Neck is supple.  Mallampati 3, neck circumference:13"  Cardiovascular:  Regular rate and rhythm , without  murmurs or carotid bruit, and without distended neck veins. Respiratory: Lungs are clear to auscultation. Skin:  Without evidence of edema, or rash Trunk: BMI is normal, as is her posture.  Neurologic exam : The patient is awake and alert, oriented to place and time.  Memory subjective described as intact. There is a normal attention span & concentration ability. Speech is fluent without   dysarthria, dysphonia or aphasia. Mood and affect are appropriate.  Cranial nerves: no loss of sense of smell or taste.  Pupils are equal and briskly reactive to light. Funduscopic exam deferred. Extraocular movements  in vertical and horizontal planes intact and without nystagmus.  After rapid head turning maneuvers the patient had a "bopping' with gaze to the lower left field. 3-4 beats, fatigable.  Visual fields by finger perimetry are intact. Hearing to finger rub intact. She reports some fullness paranasal sinus the left ear and has had tinnitus in the eft   Facial sensation intact to fine touch. Facial motor strength is symmetric and tongue and uvula move midline. Tongue protrusion into either cheek is normal. Shoulder shrug is normal.   Motor exam: Normal tone ,muscle bulk and symmetric strength in all extremities. She has arthritis in both hands. Mild action tremor in both hands. There is muscular atrophy over the left thenar eminence. ( carpal tunnel)   Sensory:  Fine touch, pinprick and vibration were tested in all extremities. Proprioception was normal.  Coordination: Rapid alternating movements  in the fingers/hands were a little slowed . Finger-to-nose maneuver  normal without evidence of ataxia, dysmetria .  Gait and station: Patient walks without assistive device but has severe knee arthritis and relies on a handrail. Her romberg is positive - she drifts backwards.  While walking she stretched out her right hand. She r turned with 3 steps, limber but walking straight was her challenge- no heel to toe possible and very unsteady heel and toe stands. .  Deep tendon reflexes: in the upper and lower extremities are symmetric and intact. Babinski maneuver response deferred.   Assessment:  After physical and neurologic examination, review of laboratory studies, imaging, neurophysiology testing and pre-existing records, assessment is that of :   There is vertigo and ataxia. This is gait ataxia . It does not affect her hands.   vertigo worse with nystagmus to the left lower field. Left ear tinnitus. Feeing of congestion or fluid in her left ear. ,akes her unsteady under the shower.    Plan:  Treatment plan and additional workup :  Brain MRI , rule out cerebrovascular disorder, schwannoma etc.  PT and Vestibular therapy at [facility] Start Allegra or Claritin, one at night po. Try for 30 days OTC.    Rv with NP in 3 month.   Asencion Partridge Leolia Vinzant MD 04/13/2019

## 2019-04-17 ENCOUNTER — Telehealth: Payer: Self-pay | Admitting: Neurology

## 2019-04-17 NOTE — Telephone Encounter (Signed)
[employer] Medicare no auth. Patient is scheduled at [facility] for 04/21/19 arrival time is 2:30 pm at [facility]. I left a voicemail for patient to be aware I also left their number of [phone] incase she needed to r/s for any reason.

## 2019-04-21 ENCOUNTER — Ambulatory Visit (HOSPITAL_COMMUNITY)
Admission: RE | Admit: 2019-04-21 | Discharge: 2019-04-21 | Disposition: A | Discharge status: Home / Self Care | Payer: Medicare Other | Source: Ambulatory Visit | Attending: Neurology | Admitting: Neurology

## 2019-04-21 ENCOUNTER — Other Ambulatory Visit: Payer: Self-pay

## 2019-04-21 DIAGNOSIS — G3281 Cerebellar ataxia in diseases classified elsewhere: Secondary | ICD-10-CM | POA: Diagnosis present

## 2019-04-24 ENCOUNTER — Telehealth: Payer: Self-pay | Admitting: Neurology

## 2019-04-24 NOTE — Telephone Encounter (Signed)
Called the pt to review her MRI results with her. There was no answer. LVM informing the pt to call back  ** If the patient calls back. Please advise the MRI of her brain was reviewed by Dr Brett Fairy and was normal. She didn't see anything of concern.  Thanks [employer]

## 2019-04-24 NOTE — Telephone Encounter (Signed)
Phone rep checked office voicemail;pt was called back and the message from [place] was read to her.  Pt has no questions and has not requested a call back. this voicemail was left @3 :09pm

## 2019-04-24 NOTE — Telephone Encounter (Signed)
-----   Message from Larey Seat, MD sent at 04/24/2019  1:08 PM EDT ----- Normal brain MRI.

## 2019-04-26 ENCOUNTER — Ambulatory Visit (HOSPITAL_COMMUNITY): Payer: Medicare Other | Attending: Neurology | Admitting: Physical Therapy

## 2019-04-26 ENCOUNTER — Other Ambulatory Visit: Payer: Self-pay

## 2019-04-26 DIAGNOSIS — R42 Dizziness and giddiness: Secondary | ICD-10-CM | POA: Diagnosis not present

## 2019-04-26 NOTE — Patient Instructions (Addendum)
Head Motion: Up and Down    Sitting, slowly move head up with eyes open. Hold position until symptoms subside. Then, move head in opposite direction. Hold position until symptoms subside. Repeat _5___ times per session. Do __3__ sessions per day.  Copyright  VHI. All rights reserved.  Sit to Side-Lying    Sit on edge of bed. 1. Turn head 45 to right. 2. Maintain head position and lie down slowly on left side. Hold until symptoms subside. 3. Sit up slowly. Hold until symptoms subside. 4. Turn head 45 to left. 5. Maintain head position and lie down slowly on right side. Hold until symptoms subside. 6. Sit up slowly. Repeat sequence __5__ times per session. Do __3__ sessions per day.  Copyright  VHI. All rights reserved.  Turning    Tilt head down 15-30, lead with head and eyes and slowly make quarter turn to left with eyes open. Hold position until symptoms subside. Repeat __5__ times per session. Do ____ sessions per day. 3 Copyright  VHI. All rights reserved.

## 2019-04-26 NOTE — Therapy (Signed)
[facility] [address] [place], [place], [address] Phone: [phone]   Fax:  [phone]  Physical Therapy Evaluation  Patient Details  Name: Jasmine Ruiz MRN: [id number] Date of Birth: [birth year] Referring Provider (PT): Asencion Partridge Dohmeier   Encounter Date: 04/26/2019  PT End of Session - 04/26/19 1617    Visit Number  1    Number of Visits  1    Authorization Type  [employer] medicare    PT Start Time  6720    PT Stop Time  1615    PT Time Calculation (min)  41 min    Activity Tolerance  Patient tolerated treatment well    Behavior During Therapy  [name] for tasks assessed/performed       Past Medical History:  Diagnosis Date  . Arthritis   . History of bronchitis 07/2013  . History of MRSA infection   . Insomnia    takes Ambien nightly as needed  . Joint pain   . Joint swelling   . Migraine    takes Relpax daily as needed;last migraine end of Feb 2015  . Plantar fasciitis   . PONV (postoperative nausea and vomiting)   . Tendonitis    left should  . Vertigo     Past Surgical History:  Procedure Laterality Date  . ABDOMINAL HYSTERECTOMY    . EXPLORATORY LAPAROTOMY    . KNEE ARTHROSCOPY Left 2014  . MOUTH SURGERY    . TOTAL KNEE ARTHROPLASTY Left 10/13/2013   Procedure: TOTAL KNEE ARTHROPLASTY;  Surgeon: Yvette Rack., MD;  Location: [facility];  Service: Orthopedics;  Laterality: Left;    There were no vitals filed for this visit.   Subjective Assessment - 04/26/19 1525    Subjective  Pt states that she has had vertigo for years.  There is nothing that she can recall that brings on the symptoms.  She states that over the last three years it has gotten worse.  Sometimes she is dizzy for days.  Sometimes she feels like she is always dizzy.    Patient Stated Goals  not to be dizzy    Currently in Pain?  No/denies         [name] PT Assessment - 04/26/19 0001      Assessment   Medical Diagnosis  vertigo     Referring  Provider (PT)  Asencion Partridge Dohmeier    Onset Date/Surgical Date  --   chronic   Next MD Visit  unkown     Prior Therapy  none      Precautions   Precautions  None      Restrictions   Weight Bearing Restrictions  No      Balance Screen   Has the patient fallen in the past 6 months  No    Has the patient had a decrease in activity level because of a fear of falling?   No    Is the patient reluctant to leave their home because of a fear of falling?   No      Prior Function   Level of Independence  Independent      Cognition   Overall Cognitive Status  Within Functional Limits for tasks assessed      Observation/Other Assessments   Focus on Therapeutic Outcomes (FOTO)   70      Functional Tests   Functional tests  Single leg stance      Single Leg Stance   Comments  B  40 seconds            Vestibular Assessment - 04/26/19 0001      Symptom Behavior   Subjective history of current problem  PT states that she had BP taken in all postiions with no problems, she has had MRI (-), she does not feel any spinning.  She has been walking several miles a day.  States that the room never spins her brain just feels funny as if she is on a swing.     Type of Dizziness   "Funny feeling in head";Comment    swimmy headed, she avoids any sudden motion    Duration of Dizziness  sometimes for weeks on end.    Symptom Nature  Variable    Aggravating Factors  Lying supine    Relieving Factors  Rest    Progression of Symptoms  Worse      Oculomotor Exam   Oculomotor Alignment  Normal    Ocular ROM  normal     Smooth Pursuits  Intact    Saccades  Intact      Oculomotor Exam-Fixation Suppressed    Head Shaking Nystagmus-Horizontal  negative       Vestibulo-Ocular Reflex   VOR 1 Head Only (x 1 viewing)  normal     VOR 2 Head and Object (x 2 viewing)  normal       Positional Testing   Dix-Hallpike  Dix-Hallpike Right;Dix-Hallpike Left      Dix-Hallpike Right   Dix-Hallpike Right  Symptoms  No nystagmus      Dix-Hallpike Left   Dix-Hallpike Left Symptoms  No nystagmus      Cognition   Cognition Orientation Level  Appropriate for developmental age      Positional Sensitivities   Sit to Supine  --   no nystagmus but feels swimmy headed    Supine to Left Side  No dizziness    Supine to Right Side  No dizziness    Right Hallpike  No dizziness    Up from Right Hallpike  Lightheadedness    Up from Left Hallpike  Lightheadedness    Nose to Right Knee  Lightheadedness    Right Knee to Sitting  No dizziness    Nose to Left Knee  Lightheadedness    Head Turning x 5  No dizziness    Head Nodding x 5  Lightheadedness    Rolling Right  No dizziness    Rolling Left  No dizziness          Objective measurements completed on examination: See above findings.       Vestibular Treatment/Exercise - 04/26/19 0001      Vestibular Treatment/Exercise   Habituation Exercises  [name];Seated Vertical Head Turns;180 degree Turns      Austin Miles   Number of Reps   5    Symptom Description   mild      Seated Vertical Head Turns   Number of Reps   --   10   Symptom Description   mild      180 degree Turns   Number of Reps   5    Symptom Description   mild             PT Education - 04/26/19 1614    Education Details  Complete exercises several times a day for best results    Person(s) Educated  Patient    Methods  Explanation    Comprehension  Verbalized understanding  PT Short Term Goals - 04/26/19 1630      PT SHORT TERM GOAL #1   Title  PT to have and be completing a HEP    Time  1    Period  Weeks    Status  New    Target Date  05/03/19                Plan - 04/26/19 1618    Clinical Impression Statement  [name] is a 63 yo female who had been experiencing a progressive lightheadedness feeling for the last three years.  She states sometimes the sx stay around for days, other times she doesn't feel as if it ever  goes away.  She denies any spinning sensation.  At this time it does not appear as if her dizziness is peripheral it appears that it is more central in nature.  The therapist went over and gave pt exercise for habituation for head nods, sidelying to sit as well as standing pivot.  I do not feel skilled therapy will benefit this pt at this time.    Personal Factors and Comorbidities  Time since onset of injury/illness/exacerbation    Examination-Activity Limitations  Carry;Lift    Examination-Participation Restrictions  Cleaning    Stability/Clinical Decision Making  Evolving/Moderate complexity    Clinical Decision Making  Moderate    Rehab Potential  Fair    PT Frequency  1x / week    PT Duration  --   1 week   PT Treatment/Interventions  Other (comment)   HEP   PT Next Visit Plan  Pt will be D/C to a HEP    PT Home Exercise Plan  head nods, sidelying to sit, stand pivots.       Patient will benefit from skilled therapeutic intervention in order to improve the following deficits and impairments:  Dizziness  Visit Diagnosis: Dizziness and giddiness     Problem List Patient Active Problem List   Diagnosis Date Noted  . Stiffness of joint, not elsewhere classified, lower leg 11/01/2013  . PONV (postoperative nausea and vomiting)   . History of MRSA infection   . Osteoarthritis of left knee 10/13/2013    Virgina OrganCynthia Shanayah Kaffenberger, PT CLT [phone] 04/26/2019, 4:31 PM  [facility] [address] [place], [place], [address] Phone: [phone]   Fax:  [phone]  Name: Jasmine Ruiz MRN: [id number] Date of Birth: [birth year]

## 2019-06-08 ENCOUNTER — Other Ambulatory Visit: Payer: Self-pay

## 2019-06-08 ENCOUNTER — Ambulatory Visit (INDEPENDENT_AMBULATORY_CARE_PROVIDER_SITE_OTHER): Payer: Medicare Other | Admitting: Otolaryngology

## 2019-06-29 ENCOUNTER — Ambulatory Visit (INDEPENDENT_AMBULATORY_CARE_PROVIDER_SITE_OTHER): Payer: Medicare Other | Admitting: Otolaryngology

## 2019-07-06 ENCOUNTER — Ambulatory Visit (INDEPENDENT_AMBULATORY_CARE_PROVIDER_SITE_OTHER): Payer: Medicare Other | Admitting: Otolaryngology

## 2019-07-13 ENCOUNTER — Ambulatory Visit: Payer: Medicare Other | Admitting: Family Medicine

## 2019-07-13 ENCOUNTER — Encounter: Payer: Self-pay | Admitting: Family Medicine

## 2019-07-13 ENCOUNTER — Other Ambulatory Visit: Payer: Self-pay

## 2019-07-13 VITALS — BP 117/69 | HR 85 | Temp 97.5°F | Ht 64.5 in | Wt 158.0 lb

## 2019-07-13 DIAGNOSIS — M542 Cervicalgia: Secondary | ICD-10-CM | POA: Diagnosis not present

## 2019-07-13 DIAGNOSIS — R42 Dizziness and giddiness: Secondary | ICD-10-CM

## 2019-07-13 NOTE — Patient Instructions (Signed)
We will order an MRI of your neck to evaluate dizziness with extension of neck  Please be careful with position changes  Continue follow up with PT  We will follow up pending MRI results.   Dizziness Dizziness is a common problem. It makes you feel unsteady or light-headed. You may feel like you are about to pass out (faint). Dizziness can lead to getting hurt if you stumble or fall. Dizziness can be caused by many things, including:  Medicines.  Not having enough water in your body (dehydration).  Illness. Follow these instructions at home: Eating and drinking   Drink enough fluid to keep your pee (urine) clear or pale yellow. This helps to keep you from getting dehydrated. Try to drink more clear fluids, such as water.  Do not drink alcohol.  Limit how much caffeine you drink or eat, if your doctor tells you to do that.  Limit how much salt (sodium) you drink or eat, if your doctor tells you to do that. Activity   Avoid making quick movements. ? When you stand up from sitting in a chair, steady yourself until you feel okay. ? In the morning, first sit up on the side of the bed. When you feel okay, stand slowly while you hold onto something. Do this until you know that your balance is fine.  If you need to stand in one place for a long time, move your legs often. Tighten and relax the muscles in your legs while you are standing.  Do not drive or use heavy machinery if you feel dizzy.  Avoid bending down if you feel dizzy. Place items in your home so you can reach them easily without leaning over. Lifestyle  Do not use any products that contain nicotine or tobacco, such as cigarettes and e-cigarettes. If you need help quitting, ask your doctor.  Try to lower your stress level. You can do this by using methods such as yoga or meditation. Talk with your doctor if you need help. General instructions  Watch your dizziness for any changes.  Take over-the-counter and  prescription medicines only as told by your doctor. Talk with your doctor if you think that you are dizzy because of a medicine that you are taking.  Tell a friend or a family member that you are feeling dizzy. If he or she notices any changes in your behavior, have this person call your doctor.  Keep all follow-up visits as told by your doctor. This is important. Contact a doctor if:  Your dizziness does not go away.  Your dizziness or light-headedness gets worse.  You feel sick to your stomach (nauseous).  You have trouble hearing.  You have new symptoms.  You are unsteady on your feet.  You feel like the room is spinning. Get help right away if:  You throw up (vomit) or have watery poop (diarrhea), and you cannot eat or drink anything.  You have trouble: ? Talking. ? Walking. ? Swallowing. ? Using your arms, hands, or legs.  You feel generally weak.  You are not thinking clearly, or you have trouble forming sentences. A friend or family member may notice this.  You have: ? Chest pain. ? Pain in your belly (abdomen). ? Shortness of breath. ? Sweating.  Your vision changes.  You are bleeding.  You have a very bad headache.  You have neck pain or a stiff neck.  You have a fever. These symptoms may be an emergency. Do not wait to  see if the symptoms will go away. Get medical help right away. Call your local emergency services (911 in the [place] Do not drive yourself to the hospital. Summary  Dizziness makes you feel unsteady or light-headed. You may feel like you are about to pass out (faint).  Drink enough fluid to keep your pee (urine) clear or pale yellow. Do not drink alcohol.  Avoid making quick movements if you feel dizzy.  Watch your dizziness for any changes. This information is not intended to replace advice given to you by your health care provider. Make sure you discuss any questions you have with your health care provider. Document Released:  07/02/2011 Document Revised: 07/16/2017 Document Reviewed: 07/30/2016 Elsevier Patient Education  2020 [employer].

## 2019-07-13 NOTE — Progress Notes (Signed)
PATIENT: Jasmine Ruiz DOB: [birth year]  REASON FOR VISIT: follow up HISTORY FROM: patient  Chief Complaint  Patient presents with  . Follow-up    3 mon f/u. Alone. Rm 1. Patient mentioned that the dizziness is still present. She has been been PT and ENT.      HISTORY OF PRESENT ILLNESS: Today 07/13/19 Jasmine Ruiz is a 63 y.o. female here today for follow up. Dizziness continues. She has not noticed this as much with position changes.  MRI was normal. She was evaluated by PT. She reports that her PT told her that "she was confusing." That what she was telling her didn't match what PT saw. She was advised to consider ENT causes of dizziness. She was Jasmine Ruiz in [place]. She was told that she had "fluid in her ear." She was told that she did not meet all criteria for Meniere's Disease.  She was referred to a second PT. She was told by this PT that she hyperextends her neck. Jasmine Ruiz does mention one event where she felt that she hyperextended her neck which she had numbness of right arm, leg and worsening dizziness. She continues to work with the second PT in [place]. She feels that if she is conscious of her head/neck alignment, dizziness is improved.  She has had neck pain for years. She only had numbness and tingling during that one event. 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 She has a torn labrum of left shoulder.   HISTORY: (copied from Jasmine Ruiz's note on 04/13/2019)  HPI:  Jasmine Ruiz is a 63 y.o. female patient  seen upon referral  from Jasmine. Tanya Ruiz for a vertigo work up on 04-13-2019.   Jasmine Ruiz is a 63 year old right-handed Caucasian female with a history of progressive vertigo over the years.  She first presented several years ago with a vertigo sensation to her primary care physician who at the time found no evidence of any other neurologic or auditory dysfunction and meanwhile she has learned to live with it.  She also reports that her father  struggled with vertigo which she called being swimmy headed, brain floating.  She describes a sensation as if she would be on a swing set with longer and longer swings until the swing turns over.  The sensation of her brain floating or falling is how she experiences her specific kind of vertigo.  My nurse has meanwhile done a orthostatic blood pressure evaluation supine blood pressure was 121/68 mmHg with a heart rate of 69 her seated blood pressure was 138/70 with a heart rate of 72 and regular her standing blood pressure was 126/74 mmHg with 80 bpm heart rate is regular.  Between supine and seated position the patient became dizzy but not between seated and standing. she felt today was a mild day.  When lying down she noted a 20-30 second delay before the vertigo manifests.  She can avoid this sensation by having the head of bed elevated.    Family history: Her father underwent the Epply maneuver several times, was a patient of Jasmine Ruiz and died in 12-11-2015 at 75, mother died in 12/11/2015 at age 42. She is married, has adult Museum/gallery conservator and 2 grandchildren. 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  Non smoker, non drinker. Retired. Coffee , but not soda.    REVIEW OF SYSTEMS: Out of a complete 14 system review of symptoms, the patient complains only of the following symptoms,  headaches, dizziness and all other reviewed systems are negative.   ALLERGIES: Allergies  Allergen Reactions  . Hydrocodone Anaphylaxis  . Percocet [Oxycodone-Acetaminophen] Anaphylaxis  . Prednisone Swelling  . Sulfa Antibiotics     unknown  . Vicodin [Hydrocodone-Acetaminophen] Nausea And Vomiting    HOME MEDICATIONS: Outpatient Medications Prior to Visit  Medication Sig Dispense Refill  . Bioflavonoid Products (BIOFLEX PO) Take by mouth.    . eletriptan (RELPAX) 40 MG tablet TAKE 1 TABLET BY MOUTH AS NEEDED MAY REPEAT IN 2 HOURS AS NEEDED FOR MIGRAINE. MAXIMUM 2 TABLETS/DAY. 52 tablet 5  . Melatonin 10 MG TABS  Take 10 mg by mouth.     . Multiple Vitamin (MULTIVITAMIN) tablet Take 1 tablet by mouth daily.    . vitamin E 100 UNIT capsule Take by mouth daily.    Marland Kitchen zolpidem (AMBIEN) 10 MG tablet Take 10 mg by mouth at bedtime as needed for sleep.    . fexofenadine (ALLEGRA) 60 MG tablet Take 1 tablet (60 mg total) by mouth 2 (two) times daily. 30 tablet 5   No facility-administered medications prior to visit.    PAST MEDICAL HISTORY: Past Medical History:  Diagnosis Date  . Arthritis   . History of bronchitis 07/2013  . History of MRSA infection   . Insomnia    takes Ambien nightly as needed  . Joint pain   . Joint swelling   . Migraine    takes Relpax daily as needed;last migraine end of Feb 2015  . Plantar fasciitis   . PONV (postoperative nausea and vomiting)   . Tendonitis    left should  . Vertigo     PAST SURGICAL HISTORY: Past Surgical History:  Procedure Laterality Date  . ABDOMINAL HYSTERECTOMY    . EXPLORATORY LAPAROTOMY    . KNEE ARTHROSCOPY Left 2014  . MOUTH SURGERY    . TOTAL KNEE ARTHROPLASTY Left 10/13/2013   Procedure: TOTAL KNEE ARTHROPLASTY;  Surgeon: Jasmine Ruiz., MD;  Location: [facility];  Service: Orthopedics;  Laterality: Left;    FAMILY HISTORY: Family History  Problem Relation Age of Onset  . Diabetes Maternal Grandfather   . Hypertension Mother   . Alzheimer's disease Mother     SOCIAL HISTORY: Social History   Socioeconomic History  . Marital status: Married    Spouse name: Not on file  . Number of children: Not on file  . Years of education: Not on file  . Highest education level: Not on file  Occupational History  . Not on file  Tobacco Use  . Smoking status: Passive Smoke Exposure - Never Smoker  . Smokeless tobacco: Never Used  Substance and Sexual Activity  . Alcohol use: No  . Drug use: No  . Sexual activity: Yes    Birth control/protection: Surgical  Other Topics Concern  . Not on file  Social History Narrative  . Not on file     Social Determinants of Health   Financial Resource Strain:   . Difficulty of Paying Living Expenses: Not on file  Food Insecurity:   . Worried About Charity fundraiser in the Last Year: Not on file  . Ran Out of Food in the Last Year: Not on file  Transportation Needs:   . Lack of Transportation (Medical): Not on file  . Lack of Transportation (Non-Medical): Not on file  Physical Activity:   . Days of Exercise per Week: Not on file  . Minutes of Exercise per Session: Not on  file  Stress:   . Feeling of Stress : Not on file  Social Connections:   . Frequency of Communication with Friends and Family: Not on file  . Frequency of Social Gatherings with Friends and Family: Not on file  . Attends Religious Services: Not on file  . Active Member of Clubs or Organizations: Not on file  . Attends Banker Meetings: Not on file  . Marital Status: Not on file  Intimate Partner Violence:   . Fear of Current or Ex-Partner: Not on file  . Emotionally Abused: Not on file  . Physically Abused: Not on file  . Sexually Abused: Not on file      PHYSICAL EXAM  Vitals:   07/13/19 1348  BP: 117/69  Pulse: 85  Temp: (!) 97.5 F (36.4 C)  TempSrc: Oral  Weight: 158 lb (71.7 kg)  Height: 5' 4.5" (1.638 m)   Body mass index is 26.7 kg/m.  Generalized: Well developed, in no acute distress  Cardiology: normal rate and rhythm, no murmur noted Respiratory: clear to auscultation bilaterally  Neurological examination  Mentation: Alert oriented to time, place, history taking. Follows all commands speech and language fluent Cranial nerve II-XII: Pupils were equal round reactive to light. Extraocular movements were full, visual field were full on confrontational test. Facial sensation and strength were normal. Uvula tongue midline. Head turning and shoulder shrug  were normal and symmetric. Motor: The motor testing reveals 5 over 5 strength of all 4 extremities. Good symmetric motor  tone is noted throughout. Pain with palpation of trapezius and movement of neck to right side.  Sensory: Sensory testing is intact to soft touch on all 4 extremities. No evidence of extinction is noted.  Coordination: Cerebellar testing reveals good finger-nose-finger and heel-to-shin bilaterally.  Gait and station: Gait is normal.  Reflexes: Deep tendon reflexes are symmetric and normal bilaterally.   DIAGNOSTIC DATA (LABS, IMAGING, TESTING) - I reviewed patient records, labs, notes, testing and imaging myself where available.  No flowsheet data found.   Lab Results  Component Value Date   WBC 8.0 02/25/2016   HGB 13.6 02/25/2016   HCT 40.9 02/25/2016   MCV 91.3 02/25/2016   PLT 309 02/25/2016      Component Value Date/Time   NA 137 02/25/2016 1510   K 3.9 02/25/2016 1510   CL 102 02/25/2016 1510   CO2 28 02/25/2016 1510   GLUCOSE 76 02/25/2016 1510   BUN 12 02/25/2016 1510   CREATININE 0.78 02/25/2016 1510   CALCIUM 9.5 02/25/2016 1510   PROT 7.1 02/25/2016 1510   ALBUMIN 4.3 02/25/2016 1510   AST 19 02/25/2016 1510   ALT 12 02/25/2016 1510   ALKPHOS 59 02/25/2016 1510   BILITOT 0.4 02/25/2016 1510   GFRNONAA 82 02/25/2016 1510   GFRAA >89 02/25/2016 1510   No results found for: CHOL, HDL, LDLCALC, LDLDIRECT, TRIG, CHOLHDL No results found for: WUJW1X No results found for: VITAMINB12 No results found for: TSH     ASSESSMENT AND PLAN 63 y.o. year old female  has a past medical history of Arthritis, History of bronchitis (07/2013), History of MRSA infection, Insomnia, Joint pain, Joint swelling, Migraine, Plantar fasciitis, PONV (postoperative nausea and vomiting), Tendonitis, and Vertigo. here with     ICD-10-CM   1. Dizziness after extension of neck  R42   2. Neck pain  M54.2     Tanisia continues to note intermittent dizziness.  Fortunately, she has been able to identify worsening with  extension of her neck, first noted by physical therapy.  She is also been  diagnosed with inner ear dysfunction.  She has a longstanding history of neck pain.  There is pain and stiffness with manipulation and movement of her neck to the right side today in the office.  No hyperreflexia noted.  I do think it is reasonable to obtain an MRI of her cervical spine.  She will continue working with physical therapy as directed.  She will follow-up closely with PCP as well.  Blood pressures have been normal.  No changes in heart rate.  She will follow-up with me as needed for worsening or unresolved symptoms.  We will consider neurosurgery evaluation if indicated by cervical MRI results.  She verbalizes understanding and agreement with this plan.   No orders of the defined types were placed in this encounter.    No orders of the defined types were placed in this encounter.     I spent 15 minutes with the patient. 50% of this time was spent counseling and educating patient on plan of care and medications.    Shawnie Dappermy Armour Villanueva, FNP-C 07/13/2019, 2:21 PM [facility] [address], [address] [place], [place] [address] [phone]

## 2019-08-16 ENCOUNTER — Telehealth: Payer: Self-pay

## 2019-08-17 ENCOUNTER — Other Ambulatory Visit: Payer: Self-pay | Admitting: Family Medicine

## 2019-08-17 ENCOUNTER — Ambulatory Visit
Admission: RE | Admit: 2019-08-17 | Discharge: 2019-08-17 | Disposition: A | Discharge status: Home / Self Care | Payer: Medicare PPO | Source: Ambulatory Visit | Attending: Family Medicine | Admitting: Family Medicine

## 2019-08-17 ENCOUNTER — Other Ambulatory Visit: Payer: Self-pay

## 2019-08-17 DIAGNOSIS — M542 Cervicalgia: Secondary | ICD-10-CM

## 2019-08-17 DIAGNOSIS — R42 Dizziness and giddiness: Secondary | ICD-10-CM

## 2019-08-18 ENCOUNTER — Ambulatory Visit (INDEPENDENT_AMBULATORY_CARE_PROVIDER_SITE_OTHER): Payer: Medicare PPO | Admitting: Family Medicine

## 2019-08-18 ENCOUNTER — Encounter: Payer: Self-pay | Admitting: Family Medicine

## 2019-08-18 VITALS — BP 180/100 | HR 86 | Temp 96.6°F | Resp 16 | Ht 65.0 in | Wt 156.0 lb

## 2019-08-18 DIAGNOSIS — S39012A Strain of muscle, fascia and tendon of lower back, initial encounter: Secondary | ICD-10-CM | POA: Diagnosis not present

## 2019-08-18 DIAGNOSIS — X503XXA Overexertion from repetitive movements, initial encounter: Secondary | ICD-10-CM

## 2019-08-18 MED ORDER — CYCLOBENZAPRINE HCL 10 MG PO TABS: 10.0000 mg | ORAL_TABLET | Freq: Three times a day (TID) | ORAL | 0 refills | Status: DC | PRN

## 2019-08-18 NOTE — Progress Notes (Signed)
Subjective:    Patient ID: Jasmine Ruiz, female    DOB: 18-Mar-1956, 64 y.o.   MRN: [id number]  HPI  Patient reports pain in her right lower back every day when she sits.  This is primarily in the afternoon after standing all day long.  The pain is located just superior and lateral to the SI joint.  She seldom experiences any neuropathic pain radiate down her leg.  The pain tends to stay just in that area.  It hurts worse while sitting.  Is actually better when she is up standing and walking.  She denies any numbness or tingling in her legs.  She denies any weakness in her legs.  She denies any recent injury.  The pain has been gradual in onset over the last several months.  She has tried yoga and stretching on her own with minimal to no improvement.  Her blood pressure today is extremely high however she had it checked twice yesterday and it was excellent.  She also monitors at home and states that her blood pressure is usually 120/80 at home.  She believes today's reading is a fictitious reading. Past Medical History:  Diagnosis Date  . Arthritis   . History of bronchitis 07/2013  . History of MRSA infection   . Insomnia    takes Ambien nightly as needed  . Joint pain   . Joint swelling   . Migraine    takes Relpax daily as needed;last migraine end of Feb 2015  . Plantar fasciitis   . PONV (postoperative nausea and vomiting)   . Tendonitis    left should  . Vertigo    Past Surgical History:  Procedure Laterality Date  . ABDOMINAL HYSTERECTOMY    . EXPLORATORY LAPAROTOMY    . KNEE ARTHROSCOPY Left 2014  . MOUTH SURGERY    . TOTAL KNEE ARTHROPLASTY Left 10/13/2013   Procedure: TOTAL KNEE ARTHROPLASTY;  Surgeon: Yvette Rack., MD;  Location: [facility];  Service: Orthopedics;  Laterality: Left;   Current Outpatient Medications on File Prior to Visit  Medication Sig Dispense Refill  . eletriptan (RELPAX) 40 MG tablet TAKE 1 TABLET BY MOUTH AS NEEDED MAY REPEAT IN 2 HOURS AS NEEDED FOR  MIGRAINE. MAXIMUM 2 TABLETS/DAY. 52 tablet 5  . Melatonin 10 MG TABS Take 10 mg by mouth.     . Multiple Vitamin (MULTIVITAMIN) tablet Take 1 tablet by mouth daily.    . vitamin E 100 UNIT capsule Take by mouth daily.    Marland Kitchen zolpidem (AMBIEN) 10 MG tablet Take 10 mg by mouth at bedtime as needed for sleep.     No current facility-administered medications on file prior to visit.   Allergies  Allergen Reactions  . Hydrocodone Anaphylaxis  . Percocet [Oxycodone-Acetaminophen] Anaphylaxis  . Prednisone Swelling  . Sulfa Antibiotics     unknown  . Vicodin [Hydrocodone-Acetaminophen] Nausea And Vomiting   Social History   Socioeconomic History  . Marital status: Married    Spouse name: Not on file  . Number of children: Not on file  . Years of education: Not on file  . Highest education level: Not on file  Occupational History  . Not on file  Tobacco Use  . Smoking status: Passive Smoke Exposure - Never Smoker  . Smokeless tobacco: Never Used  Substance and Sexual Activity  . Alcohol use: No  . Drug use: No  . Sexual activity: Yes    Birth control/protection: Surgical  Other Topics Concern  .  Not on file  Social History Narrative  . Not on file   Social Determinants of Health   Financial Resource Strain:   . Difficulty of Paying Living Expenses: Not on file  Food Insecurity:   . Worried About Programme researcher, broadcasting/film/video in the Last Year: Not on file  . Ran Out of Food in the Last Year: Not on file  Transportation Needs:   . Lack of Transportation (Medical): Not on file  . Lack of Transportation (Non-Medical): Not on file  Physical Activity:   . Days of Exercise per Week: Not on file  . Minutes of Exercise per Session: Not on file  Stress:   . Feeling of Stress : Not on file  Social Connections:   . Frequency of Communication with Friends and Family: Not on file  . Frequency of Social Gatherings with Friends and Family: Not on file  . Attends Religious Services: Not on file   . Active Member of Clubs or Organizations: Not on file  . Attends Banker Meetings: Not on file  . Marital Status: Not on file  Intimate Partner Violence:   . Fear of Current or Ex-Partner: Not on file  . Emotionally Abused: Not on file  . Physically Abused: Not on file  . Sexually Abused: Not on file       Review of Systems  All other systems reviewed and are negative.      Objective:   Physical Exam Vitals reviewed.  Constitutional:      Appearance: Normal appearance.  Cardiovascular:     Rate and Rhythm: Normal rate and regular rhythm.     Heart sounds: Normal heart sounds.  Pulmonary:     Effort: Pulmonary effort is normal.     Breath sounds: Normal breath sounds.  Musculoskeletal:     Lumbar back: Tenderness present. No swelling, deformity or bony tenderness. Normal range of motion. Negative right straight leg raise test and negative left straight leg raise test.       Back:  Neurological:     Mental Status: She is alert.           Assessment & Plan:  Repetitive strain injury of lower back, initial encounter - Plan: DG Lumbar Spine Complete  Given the fact the pain has been present now for several months I will obtain an x-ray of the lumbar spine to evaluate further.  SI joint inflammation is on the differential diagnosis however I believe this is most likely chronic muscle pain.  If the x-ray of the back is relatively normal I would recommend physical therapy.  She can use Flexeril 5 to 10 mg every 8 hours as needed for muscle spasms.  Her blood pressure today is extremely high however this most likely is whitecoat.  Her blood pressure at home is well controlled.  She will start checking it more regularly at home and notify me if greater than 120/80.

## 2019-08-22 ENCOUNTER — Telehealth: Payer: Self-pay | Admitting: *Deleted

## 2019-08-22 ENCOUNTER — Ambulatory Visit (HOSPITAL_COMMUNITY)
Admission: RE | Admit: 2019-08-22 | Discharge: 2019-08-22 | Disposition: A | Discharge status: Home / Self Care | Payer: Medicare PPO | Source: Ambulatory Visit | Attending: Family Medicine | Admitting: Family Medicine

## 2019-08-22 ENCOUNTER — Other Ambulatory Visit: Payer: Self-pay

## 2019-08-22 DIAGNOSIS — X503XXA Overexertion from repetitive movements, initial encounter: Secondary | ICD-10-CM | POA: Diagnosis not present

## 2019-08-22 DIAGNOSIS — M419 Scoliosis, unspecified: Secondary | ICD-10-CM | POA: Insufficient documentation

## 2019-08-22 DIAGNOSIS — Q7649 Other congenital malformations of spine, not associated with scoliosis: Secondary | ICD-10-CM | POA: Insufficient documentation

## 2019-08-22 DIAGNOSIS — S39012A Strain of muscle, fascia and tendon of lower back, initial encounter: Secondary | ICD-10-CM | POA: Insufficient documentation

## 2019-08-22 NOTE — Telephone Encounter (Signed)
Called patient and LVM informing her the MRI of cervical spine did show some mild degeneration but no foraminal stenosis or cord compression. NP did not see anything with the MRI that could cause her dizziness.Advised she should continue working with PT if beneficial. Left # for questions.

## 2019-08-25 ENCOUNTER — Other Ambulatory Visit: Payer: Self-pay | Admitting: Family Medicine

## 2019-08-25 DIAGNOSIS — M419 Scoliosis, unspecified: Secondary | ICD-10-CM

## 2019-08-28 ENCOUNTER — Ambulatory Visit (HOSPITAL_COMMUNITY): Payer: Medicare PPO | Attending: Family Medicine | Admitting: Physical Therapy

## 2019-08-28 ENCOUNTER — Encounter (HOSPITAL_COMMUNITY): Payer: Self-pay | Admitting: Physical Therapy

## 2019-08-28 ENCOUNTER — Other Ambulatory Visit: Payer: Self-pay

## 2019-08-28 DIAGNOSIS — R293 Abnormal posture: Secondary | ICD-10-CM | POA: Diagnosis present

## 2019-08-28 DIAGNOSIS — M545 Low back pain: Secondary | ICD-10-CM | POA: Diagnosis not present

## 2019-08-28 DIAGNOSIS — R42 Dizziness and giddiness: Secondary | ICD-10-CM | POA: Diagnosis present

## 2019-08-28 DIAGNOSIS — G8929 Other chronic pain: Secondary | ICD-10-CM | POA: Diagnosis present

## 2019-08-28 NOTE — Therapy (Signed)
[place], [place], [address] Phone: [phone]   Fax:  [phone]  Physical Therapy Evaluation  Patient Details  Name: Jasmine Ruiz MRN: [id number] Date of Birth: 12/31/1955 Referring Provider (PT): Jenna Luo MD    Encounter Date: 08/28/2019  PT End of Session - 08/28/19 1347    Visit Number  1    Number of Visits  8    Date for PT Re-Evaluation  09/29/19    Authorization Type  [employer]    Authorization Time Period  08/28/19-09/29/19    Authorization - Visit Number  1    Authorization - Number of Visits  10    PT Start Time  1304    PT Stop Time  1345    PT Time Calculation (min)  41 min    Activity Tolerance  Patient tolerated treatment well    Behavior During Therapy  [facility] for tasks assessed/performed       Past Medical History:  Diagnosis Date  . Arthritis   . History of bronchitis 07/2013  . History of MRSA infection   . Insomnia    takes Ambien nightly as needed  . Joint pain   . Joint swelling   . Migraine    takes Relpax daily as needed;last migraine end of Feb 2015  . Plantar fasciitis   . PONV (postoperative nausea and vomiting)   . Tendonitis    left should  . Vertigo     Past Surgical History:  Procedure Laterality Date  . ABDOMINAL HYSTERECTOMY    . EXPLORATORY LAPAROTOMY    . KNEE ARTHROSCOPY Left 2014  . MOUTH SURGERY    . TOTAL KNEE ARTHROPLASTY Left 10/13/2013   Procedure: TOTAL KNEE ARTHROPLASTY;  Surgeon: Yvette Rack., MD;  Location: [facility];  Service: Orthopedics;  Laterality: Left;    There were no vitals filed for this visit.   Subjective Assessment - 08/28/19 1312    Subjective  Patient presents to physical therapy with complaint of RT low back pain. Patient says she has had issues with her RT hip for several years, and says she feels her RT hip had always been higher and uneven, but was recently diagnosed with scoliosis based on recent xrays she had done.  Patient says her low back really started to bother her around March of last year. Patient says she was given muscle relaxers for her back but has not taken them. Patient says she had been managing sx with heating pad and stretching. Patient says she is unable to sit comfortably which bothers her.    Pertinent History  Hx of vertigo    Limitations  Sitting;House hold activities    How long can you sit comfortably?  5 minutes    How long can you stand comfortably?  Needs support due to vertigo    How long can you walk comfortably?  Can walk several miles    Diagnostic tests  xrays    Patient Stated Goals  Not to hurt anymore    Currently in Pain?  Yes    Pain Score  1     Pain Location  Back    Pain Orientation  Right;Posterior;Lower    Pain Descriptors / Indicators  Discomfort    Pain Type  Chronic pain    Pain Onset  More than a month ago    Pain Frequency  Constant    Aggravating Factors   sitting, prolonged positions  Pain Relieving Factors  heating pad, stretching    Effect of Pain on Daily Activities  Limits         [facility] PT Assessment - 08/28/19 0001      Assessment   Medical Diagnosis  Low back pain     Referring Provider (PT)  Jenna Luo MD     Next MD Visit  None scheduled     Prior Therapy  none      Precautions   Precautions  None    Precaution Comments  Does note Hx of vertigo      Restrictions   Weight Bearing Restrictions  No      Balance Screen   Has the patient fallen in the past 6 months  Yes    How many times?  1-2   Reports because of her knee, not vertigo    Has the patient had a decrease in activity level because of a fear of falling?   No    Is the patient reluctant to leave their home because of a fear of falling?   No      Prior Function   Level of Independence  Independent      Cognition   Overall Cognitive Status  Within Functional Limits for tasks assessed      Observation/Other Assessments   Focus on Therapeutic Outcomes (FOTO)    44% limited       Sensation   Light Touch  Appears Intact      ROM / Strength   AROM / PROM / Strength  AROM;Strength      AROM   Overall AROM Comments  Lumbar AROM WFL, mild discomfort with RT sidebend       Strength   Strength Assessment Site  Hip;Knee;Ankle    Right/Left Hip  Right;Left    Right Hip Flexion  4+/5    Right Hip Extension  4-/5    Right Hip ABduction  4/5    Left Hip Flexion  5/5    Left Hip Extension  4+/5    Left Hip ABduction  4+/5    Right/Left Knee  Right;Left    Right Knee Flexion  5/5    Right Knee Extension  5/5    Left Knee Flexion  5/5    Left Knee Extension  5/5    Right/Left Ankle  Right;Left    Right Ankle Dorsiflexion  5/5    Left Ankle Dorsiflexion  5/5      Palpation   Palpation comment  Mod tenderness to palpation about RT SI sulcus       Special Tests    Special Tests  Sacrolliac Tests;Leg LengthTest    Sacroiliac Tests   Sacral Compression    Leg length test   Apparent      Sacral Compression   Findings  Positive    Side   Right      Apparent   Comments  RLE> LLE       Balance   Balance Assessed  Yes      Static Standing Balance   Static Standing Balance -  Activities   Tandam Stance - Right Leg;Tandam Stance - Left Leg    Static Standing - Comment/# of Minutes  30 sec; 10 sec with increased sway                 Objective measurements completed on examination: See above findings.      [facility] Adult PT Treatment/Exercise - 08/28/19 0001  Exercises   Exercises  Lumbar      Lumbar Exercises: Supine   Bridge  5 reps;5 seconds             PT Education - 08/28/19 1319    Education Details  Patient educated on evaluation findings and POC    Person(s) Educated  Patient    Methods  Explanation    Comprehension  Verbalized understanding       PT Short Term Goals - 08/28/19 1607      PT SHORT TERM GOAL #1   Title  Patient will be independent with initial HEP to improve functional outcomes    Time   2    Period  Weeks    Status  New    Target Date  09/15/19        PT Long Term Goals - 08/28/19 1607      PT LONG TERM GOAL #1   Title  Patient will improve FOTO score to <25% to indicate improvement in functional outcomes    Time  4    Period  Weeks    Status  New    Target Date  09/29/19      PT LONG TERM GOAL #2   Title  Patient will report at least 75% overall improvement in subjective complaint to indicate improvement in ability to perform ADLs.    Time  4    Period  Weeks    Status  New    Target Date  09/29/19      PT LONG TERM GOAL #3   Title  Patient will have equal to or > 4+/5 MMT throughout RLE to improve ability to perform functional mobility, stair ambulation and ADLs.    Time  4    Period  Weeks    Status  New    Target Date  09/29/19      PT LONG TERM GOAL #4   Title  Patient will be able to sit >1 hour undisturbed due to low back pain in order to read/ watch TV/ eat dinner.    Time  4    Period  Weeks    Status  New             Plan - 08/28/19 1602    Clinical Impression Statement  Patient is a 64 y.o. female who presents to physical therapy with complaint of chronic RT side LBP. Patient demonstrates decreased strength, increased tenderness to palpation, structural asymmetry and balance deficits which are likely contributing to symptoms of pain and are negatively impacting patient ability to perform ADLs and functional mobility tasks. Patient will benefit from skilled physical therapy services to address these deficits to reduce pain, improve level of function with ADLs and functional mobility tasks.    Examination-Activity Limitations  Carry;Sit;Sleep;Squat;Lift    Examination-Participation Restrictions  Community Activity;Yard Work    Stability/Clinical Decision Making  Stable/Uncomplicated    Designer, jewellery  Low    Rehab Potential  Good    PT Frequency  2x / week    PT Duration  4 weeks    PT Treatment/Interventions  ADLs/Self Care  Home Management;Therapeutic exercise;Therapeutic activities;Patient/family education;DME Instruction;Balance training;Gait training;Stair training;Functional mobility training;Neuromuscular re-education;Manual techniques;Taping;Joint Manipulations;Spinal Manipulations;Passive range of motion    PT Next Visit Plan  Review goals and HEP, add MET for SI dysfunction to HEP (iso hip abd/ add and bridging). Progress hip and core strengthening as tolerated.    PT Home Exercise Plan  08/28/19: hip bridge  with 5 sec holds    Consulted and Agree with Plan of Care  Patient       Patient will benefit from skilled therapeutic intervention in order to improve the following deficits and impairments:  Pain, Improper body mechanics, Postural dysfunction, Decreased activity tolerance, Decreased strength, Decreased balance  Visit Diagnosis: Chronic right-sided low back pain without sciatica  Abnormal posture     Problem List Patient Active Problem List   Diagnosis Date Noted  . Stiffness of joint, not elsewhere classified, lower leg 11/01/2013  . PONV (postoperative nausea and vomiting)   . History of MRSA infection   . Osteoarthritis of left knee 10/13/2013   4:15 PM, 08/28/19 Josue Hector PT DPT  Physical Therapist with [facility]  [phone] [place] [address] [place], [place], [address] Phone: [phone]   Fax:  [phone]  Name: Jasmine Ruiz MRN: [id number] Date of Birth: 1955-10-28

## 2019-08-30 ENCOUNTER — Encounter (HOSPITAL_COMMUNITY): Payer: Self-pay | Admitting: Physical Therapy

## 2019-08-30 ENCOUNTER — Other Ambulatory Visit: Payer: Self-pay

## 2019-08-30 ENCOUNTER — Ambulatory Visit (HOSPITAL_COMMUNITY): Payer: Medicare PPO | Admitting: Physical Therapy

## 2019-08-30 DIAGNOSIS — G8929 Other chronic pain: Secondary | ICD-10-CM

## 2019-08-30 DIAGNOSIS — R293 Abnormal posture: Secondary | ICD-10-CM

## 2019-08-30 DIAGNOSIS — M545 Low back pain, unspecified: Secondary | ICD-10-CM

## 2019-08-30 NOTE — Therapy (Signed)
[place], [place], [address] Phone: [phone]   Fax:  [phone]  Physical Therapy Treatment  Patient Details  Name: Jasmine Ruiz MRN: [id number] Date of Birth: [birth year] Referring Provider (PT): Jenna Luo MD    Encounter Date: 08/30/2019  PT End of Session - 08/30/19 1528    Visit Number  2    Number of Visits  8    Date for PT Re-Evaluation  09/29/19    Authorization Type  [employer]    Authorization Time Period  08/28/19-09/29/19    Authorization - Visit Number  2    Authorization - Number of Visits  10    PT Start Time  1520    PT Stop Time  1605    PT Time Calculation (min)  45 min    Activity Tolerance  Patient tolerated treatment well    Behavior During Therapy  [facility] for tasks assessed/performed       Past Medical History:  Diagnosis Date  . Arthritis   . History of bronchitis 07/2013  . History of MRSA infection   . Insomnia    takes Ambien nightly as needed  . Joint pain   . Joint swelling   . Migraine    takes Relpax daily as needed;last migraine end of Feb 2015  . Plantar fasciitis   . PONV (postoperative nausea and vomiting)   . Tendonitis    left should  . Vertigo     Past Surgical History:  Procedure Laterality Date  . ABDOMINAL HYSTERECTOMY    . EXPLORATORY LAPAROTOMY    . KNEE ARTHROSCOPY Left 2014  . MOUTH SURGERY    . TOTAL KNEE ARTHROPLASTY Left 10/13/2013   Procedure: TOTAL KNEE ARTHROPLASTY;  Surgeon: Yvette Rack., MD;  Location: [facility];  Service: Orthopedics;  Laterality: Left;    There were no vitals filed for this visit.  Subjective Assessment - 08/30/19 1527    Subjective  Patient says she is doing good today. Says she has been compliant with HEP with no issues. Says she felt off balance at first but improved with practice. Says she has not really had pain today, and does not have pain currently.    Pertinent History  Hx of vertigo    Limitations   Sitting;House hold activities    How long can you sit comfortably?  5 minutes    How long can you stand comfortably?  Needs support due to vertigo    How long can you walk comfortably?  Can walk several miles    Diagnostic tests  xrays    Patient Stated Goals  Not to hurt anymore    Currently in Pain?  No/denies    Pain Onset  More than a month ago                       [facility] Adult PT Treatment/Exercise - 08/30/19 0001      Lumbar Exercises: Supine   Ab Set  10 reps;5 seconds    Bridge  10 reps;5 seconds    Large Ball Oblique Isometric Limitations  iso hip abd with belt 10x5"; iso hip add with foam roll 10 x5"             PT Education - 08/30/19 1623    Education Details  Patient educated on MET correction and exercise technique. Educated patient on proper mechanics and importance of gluteal activation with sit to  stand to reduce lumbar stress. Issued updated HEP handout.    Person(s) Educated  Patient    Methods  Explanation;Handout    Comprehension  Verbalized understanding       PT Short Term Goals - 08/28/19 1607      PT SHORT TERM GOAL #1   Title  Patient will be independent with initial HEP to improve functional outcomes    Time  2    Period  Weeks    Status  New    Target Date  09/15/19        PT Long Term Goals - 08/28/19 1607      PT LONG TERM GOAL #1   Title  Patient will improve FOTO score to <25% to indicate improvement in functional outcomes    Time  4    Period  Weeks    Status  New    Target Date  09/29/19      PT LONG TERM GOAL #2   Title  Patient will report at least 75% overall improvement in subjective complaint to indicate improvement in ability to perform ADLs.    Time  4    Period  Weeks    Status  New    Target Date  09/29/19      PT LONG TERM GOAL #3   Title  Patient will have equal to or > 4+/5 MMT throughout RLE to improve ability to perform functional mobility, stair ambulation and ADLs.    Time  4    Period   Weeks    Status  New    Target Date  09/29/19      PT LONG TERM GOAL #4   Title  Patient will be able to sit >1 hour undisturbed due to low back pain in order to read/ watch TV/ eat dinner.    Time  4    Period  Weeks    Status  New            Plan - 08/30/19 1615    Clinical Impression Statement  Patient tolerated session very well today, with no increase or exacerbation of pain sx. Reviewed goals and HEP. Educated patient on and performed MET technique for SI joint correction. Patient educated on purpose and function of MET. Also added ab sets to begin core strengthening. Test retest of MET showed reduction of LLD in supine position. Patient noted improved ability to transfer from supine to sit and sit to stand. Patient cued on mechanics of sit to stand and activation and use of gluteals when standing from seated position to reduce strain on lumbar region. Patient issued updated HEP handout.    Examination-Activity Limitations  Carry;Sit;Sleep;Squat;Lift    Examination-Participation Restrictions  Community Activity;Yard Work    Stability/Clinical Decision Making  Stable/Uncomplicated    Rehab Potential  Good    PT Frequency  2x / week    PT Duration  4 weeks    PT Treatment/Interventions  ADLs/Self Care Home Management;Therapeutic exercise;Therapeutic activities;Patient/family education;DME Instruction;Balance training;Gait training;Stair training;Functional mobility training;Neuromuscular re-education;Manual techniques;Taping;Joint Manipulations;Spinal Manipulations;Passive range of motion    PT Next Visit Plan  Assess response to HEP. Progress hip and core strengthening as tolerated. Add clamshells, ab marching, sit to stands next visit.    PT Home Exercise Plan  08/28/19: hip bridge with 5 sec holds    Consulted and Agree with Plan of Care  Patient       Patient will benefit from skilled therapeutic intervention in order to improve  the following deficits and impairments:  Pain,  Improper body mechanics, Postural dysfunction, Decreased activity tolerance, Decreased strength, Decreased balance  Visit Diagnosis: Chronic right-sided low back pain without sciatica  Abnormal posture     Problem List Patient Active Problem List   Diagnosis Date Noted  . Stiffness of joint, not elsewhere classified, lower leg 11/01/2013  . PONV (postoperative nausea and vomiting)   . History of MRSA infection   . Osteoarthritis of left knee 10/13/2013   4:26 PM, 08/30/19 Josue Hector PT DPT  Physical Therapist with [facility]  [phone] [place] [address] [place], [place], [address] Phone: [phone]   Fax:  [phone]  Name: Jasmine Ruiz MRN: [id number] Date of Birth: 03-Jul-1956

## 2019-08-30 NOTE — Patient Instructions (Signed)
Access Code: [facility]  URL: [email or web address]  Date: 08/30/2019  Prepared by: Georges Lynch   Exercises Hooklying Isometric Hip Abduction with Belt - 10 reps - 2 sets - 5 hold - 1x daily - 7x weekly Supine Hip Adduction Isometric with Ball - 10 reps - 2 sets - 5 hold - 1x daily - 7x weekly Supine Bridge - 10 reps - 2 sets - 5 hold - 1x daily - 7x weekly Supine Transversus Abdominis Bracing - Hands on Stomach - 10 reps - 2 sets - 5 hold - 1x daily - 7x weekly

## 2019-09-04 ENCOUNTER — Ambulatory Visit (HOSPITAL_COMMUNITY): Payer: Medicare PPO | Admitting: Physical Therapy

## 2019-09-04 ENCOUNTER — Other Ambulatory Visit: Payer: Self-pay

## 2019-09-04 DIAGNOSIS — G8929 Other chronic pain: Secondary | ICD-10-CM

## 2019-09-04 DIAGNOSIS — M545 Low back pain: Secondary | ICD-10-CM | POA: Diagnosis not present

## 2019-09-04 DIAGNOSIS — R293 Abnormal posture: Secondary | ICD-10-CM

## 2019-09-04 DIAGNOSIS — R42 Dizziness and giddiness: Secondary | ICD-10-CM

## 2019-09-04 NOTE — Therapy (Signed)
[place], [place], [address] Phone: [phone]   Fax:  [phone]  Physical Therapy Treatment  Patient Details  Name: Jasmine Ruiz MRN: [id number] Date of Birth: Dec 21, 1955 Referring Provider (PT): Jenna Luo MD    Encounter Date: 09/04/2019  PT End of Session - 09/04/19 1755    Visit Number  3    Number of Visits  8    Date for PT Re-Evaluation  09/29/19    Authorization Type  [employer]    Authorization Time Period  08/28/19-09/29/19    Authorization - Visit Number  3    Authorization - Number of Visits  10    PT Start Time  1400    PT Stop Time  1445    PT Time Calculation (min)  45 min    Activity Tolerance  Patient tolerated treatment well    Behavior During Therapy  [facility] for tasks assessed/performed       Past Medical History:  Diagnosis Date  . Arthritis   . History of bronchitis 07/2013  . History of MRSA infection   . Insomnia    takes Ambien nightly as needed  . Joint pain   . Joint swelling   . Migraine    takes Relpax daily as needed;last migraine end of Feb 2015  . Plantar fasciitis   . PONV (postoperative nausea and vomiting)   . Tendonitis    left should  . Vertigo     Past Surgical History:  Procedure Laterality Date  . ABDOMINAL HYSTERECTOMY    . EXPLORATORY LAPAROTOMY    . KNEE ARTHROSCOPY Left 2014  . MOUTH SURGERY    . TOTAL KNEE ARTHROPLASTY Left 10/13/2013   Procedure: TOTAL KNEE ARTHROPLASTY;  Surgeon: Yvette Rack., MD;  Location: [place];  Service: Orthopedics;  Laterality: Left;    There were no vitals filed for this visit.  Subjective Assessment - 09/04/19 1748    Subjective  pt reports she is much improved.  States she can tell her Rt LE is weaker as she is unable to balance on it like her Lt one.  Currently without pain.    Currently in Pain?  No/denies                       [facility] Adult PT Treatment/Exercise - 09/04/19 0001      Lumbar  Exercises: Stretches   Piriformis Stretch  Right;Left;2 reps;30 seconds    Piriformis Stretch Limitations  seated      Lumbar Exercises: Seated   Sit to Stand  10 reps;Limitations    Sit to Stand Limitations  no UE's with cues for glute activation, form and posturing      Lumbar Exercises: Supine   Ab Set  10 reps;5 seconds    Bridge  10 reps;5 seconds    Straight Leg Raise  10 reps;Limitations    Straight Leg Raises Limitations  bilateral, slow and controlled      Lumbar Exercises: Sidelying   Clam  Both;10 reps;5 seconds      Manual Therapy   Manual Therapy  Muscle Energy Technique    Manual therapy comments  checked at beginning of session and not needed as SI was in alignment               PT Short Term Goals - 08/28/19 1607      PT SHORT TERM GOAL #1   Title  Patient will be independent with initial HEP to improve functional outcomes    Time  2    Period  Weeks    Status  New    Target Date  09/15/19        PT Long Term Goals - 08/28/19 1607      PT LONG TERM GOAL #1   Title  Patient will improve FOTO score to <25% to indicate improvement in functional outcomes    Time  4    Period  Weeks    Status  New    Target Date  09/29/19      PT LONG TERM GOAL #2   Title  Patient will report at least 75% overall improvement in subjective complaint to indicate improvement in ability to perform ADLs.    Time  4    Period  Weeks    Status  New    Target Date  09/29/19      PT LONG TERM GOAL #3   Title  Patient will have equal to or > 4+/5 MMT throughout RLE to improve ability to perform functional mobility, stair ambulation and ADLs.    Time  4    Period  Weeks    Status  New    Target Date  09/29/19      PT LONG TERM GOAL #4   Title  Patient will be able to sit >1 hour undisturbed due to low back pain in order to read/ watch TV/ eat dinner.    Time  4    Period  Weeks    Status  New            Plan - 09/04/19 1758    Clinical Impression  Statement  Pt overall improving.  Checked SI alignment at beginning of session without any descrepencies.  Pt also without pain this session.  Advanced therex to include piriformis stretch, sit to stands, SLR and sit to stands.  Pt able to complete all in good form and control with cues.  Pt able to exhibit correct mobility form without cues (supine to/from sit).  Pt reported no change or return of symptoms at end of session.    Examination-Activity Limitations  Carry;Sit;Sleep;Squat;Lift    Examination-Participation Restrictions  Community Activity;Yard Work    Stability/Clinical Decision Making  Stable/Uncomplicated    Rehab Potential  Good    PT Frequency  2x / week    PT Duration  4 weeks    PT Treatment/Interventions  ADLs/Self Care Home Management;Therapeutic exercise;Therapeutic activities;Patient/family education;DME Instruction;Balance training;Gait training;Stair training;Functional mobility training;Neuromuscular re-education;Manual techniques;Taping;Joint Manipulations;Spinal Manipulations;Passive range of motion    PT Next Visit Plan  Update HEP to include added exercises from this session next session.  Add marching with stab and vectors as glutes are weak (esp on Rt).    PT Home Exercise Plan  08/28/19: hip bridge with 5 sec holds    Consulted and Agree with Plan of Care  Patient       Patient will benefit from skilled therapeutic intervention in order to improve the following deficits and impairments:  Pain, Improper body mechanics, Postural dysfunction, Decreased activity tolerance, Decreased strength, Decreased balance  Visit Diagnosis: Chronic right-sided low back pain without sciatica  Abnormal posture  Dizziness and giddiness     Problem List Patient Active Problem List   Diagnosis Date Noted  . Stiffness of joint, not elsewhere classified, lower leg 11/01/2013  . PONV (postoperative nausea and vomiting)   . History of MRSA infection   .  Osteoarthritis of left knee  10/13/2013   Lurena Nida, PTA/CLT [phone]  Lurena Nida 09/04/2019, 6:02 PM  [facility] [address] [place], [place], [address] Phone: [phone]   Fax:  [phone]  Name: AVAROSE MERVINE MRN: [id number] Date of Birth: 10/29/1955

## 2019-09-04 NOTE — Patient Instructions (Signed)
Piriformis Stretch, Sitting    Sit, one ankle on opposite knee, same-side hand on crossed knee. Push down on knee, keeping spine straight. Lean torso forward, with flat back, until tension is felt in hamstrings and gluteals of crossed-leg side. Hold _30__ seconds. Repeat _3__ times per session. Do _2__ sessions per day.  Straight Leg Raise    Tighten stomach and slowly raise locked right leg even with bent Left knee.  Repeat _10_ times per set. Do both legs    HIP: Abduction / External Rotation - Side-Lying    Lie on side with hip and knees bent. Raise top knee up, squeezing glutes. Keep ankles together. _10__ reps per set, _3__second holds

## 2019-09-06 ENCOUNTER — Ambulatory Visit (HOSPITAL_COMMUNITY): Payer: Medicare PPO | Admitting: Physical Therapy

## 2019-09-06 ENCOUNTER — Other Ambulatory Visit: Payer: Self-pay

## 2019-09-06 ENCOUNTER — Encounter (HOSPITAL_COMMUNITY): Payer: Self-pay | Admitting: Physical Therapy

## 2019-09-06 DIAGNOSIS — R293 Abnormal posture: Secondary | ICD-10-CM

## 2019-09-06 DIAGNOSIS — M545 Low back pain, unspecified: Secondary | ICD-10-CM

## 2019-09-06 DIAGNOSIS — G8929 Other chronic pain: Secondary | ICD-10-CM

## 2019-09-06 DIAGNOSIS — R42 Dizziness and giddiness: Secondary | ICD-10-CM

## 2019-09-06 NOTE — Therapy (Signed)
[place], [place], [address] Phone: [phone]   Fax:  [phone]  Physical Therapy Treatment  Patient Details  Name: Jasmine Ruiz MRN: [id number] Date of Birth: 1955/11/25 Referring Provider (PT): Jenna Luo MD    Encounter Date: 09/06/2019  PT End of Session - 09/06/19 1400    Visit Number  4    Number of Visits  8    Date for PT Re-Evaluation  09/29/19    Authorization Type  [employer]    Authorization Time Period  08/28/19-09/29/19    Authorization - Visit Number  4    Authorization - Number of Visits  10    PT Start Time  1400    PT Stop Time  1440    PT Time Calculation (min)  40 min    Activity Tolerance  Patient tolerated treatment well    Behavior During Therapy  [facility] for tasks assessed/performed       Past Medical History:  Diagnosis Date  . Arthritis   . History of bronchitis 07/2013  . History of MRSA infection   . Insomnia    takes Ambien nightly as needed  . Joint pain   . Joint swelling   . Migraine    takes Relpax daily as needed;last migraine end of Feb 2015  . Plantar fasciitis   . PONV (postoperative nausea and vomiting)   . Tendonitis    left should  . Vertigo     Past Surgical History:  Procedure Laterality Date  . ABDOMINAL HYSTERECTOMY    . EXPLORATORY LAPAROTOMY    . KNEE ARTHROSCOPY Left 2014  . MOUTH SURGERY    . TOTAL KNEE ARTHROPLASTY Left 10/13/2013   Procedure: TOTAL KNEE ARTHROPLASTY;  Surgeon: Yvette Rack., MD;  Location: [place];  Service: Orthopedics;  Laterality: Left;    There were no vitals filed for this visit.  Subjective Assessment - 09/06/19 1403    Subjective  States she hasa  little tightness and discomfort. States that she does the exercises and she can now sit longer and it doesn't bother her. States she can now sit and eat a meal about (45 minutes)         [facility] PT Assessment - 09/06/19 0001      Assessment   Medical Diagnosis   Low back pain     Referring Provider (PT)  Jenna Luo MD                    [facility] Adult PT Treatment/Exercise - 09/06/19 0001      Lumbar Exercises: Standing   Other Standing Lumbar Exercises  self mobilization to gluteal muscles and low back with lacrosse ball       Lumbar Exercises: Supine   Ab Set  5 reps;5 seconds    Other Supine Lumbar Exercises  MET push pull isometric (hip flexion/extension) 3x5 5" holds E      Lumbar Exercises: Prone   Straight Leg Raise  10 reps;3 seconds   3 sets, bilateral      Manual Therapy   Manual Therapy  Soft tissue mobilization;Joint mobilization    Manual therapy comments  all manual interventions performed independently of other interventions.     Joint Mobilization  PA to R lateral sacrum grade II/III    Soft tissue mobilization  STM to right lateral border of sacrum  PT Education - 09/06/19 1428    Education Details  on anatomy of SIJ, how exercises move and help with SIJ.    Person(s) Educated  Patient    Methods  Explanation    Comprehension  Verbalized understanding       PT Short Term Goals - 08/28/19 1607      PT SHORT TERM GOAL #1   Title  Patient will be independent with initial HEP to improve functional outcomes    Time  2    Period  Weeks    Status  New    Target Date  09/15/19        PT Long Term Goals - 08/28/19 1607      PT LONG TERM GOAL #1   Title  Patient will improve FOTO score to <25% to indicate improvement in functional outcomes    Time  4    Period  Weeks    Status  New    Target Date  09/29/19      PT LONG TERM GOAL #2   Title  Patient will report at least 75% overall improvement in subjective complaint to indicate improvement in ability to perform ADLs.    Time  4    Period  Weeks    Status  New    Target Date  09/29/19      PT LONG TERM GOAL #3   Title  Patient will have equal to or > 4+/5 MMT throughout RLE to improve ability to perform functional mobility,  stair ambulation and ADLs.    Time  4    Period  Weeks    Status  New    Target Date  09/29/19      PT LONG TERM GOAL #4   Title  Patient will be able to sit >1 hour undisturbed due to low back pain in order to read/ watch TV/ eat dinner.    Time  4    Period  Weeks    Status  New            Plan - 09/06/19 1400    Clinical Impression Statement  Patient tolerated session very well. Educated patient in anatomy of SIJ and how exercises help and move SIJ. Added additional MET technique and hip strengthening exercises to home program. Patient tolerated these well and reported no tightness or pain in SIJ end of session. Patient will continue to benefit from skilled physical therapy to improve overall QOL.    Examination-Activity Limitations  Carry;Sit;Sleep;Squat;Lift    Examination-Participation Restrictions  Community Activity;Yard Work    Stability/Clinical Decision Making  Stable/Uncomplicated    Rehab Potential  Good    PT Frequency  2x / week    PT Duration  4 weeks    PT Treatment/Interventions  ADLs/Self Care Home Management;Therapeutic exercise;Therapeutic activities;Patient/family education;DME Instruction;Balance training;Gait training;Stair training;Functional mobility training;Neuromuscular re-education;Manual techniques;Taping;Joint Manipulations;Spinal Manipulations;Passive range of motion    PT Next Visit Plan  Update HEP to include added exercises from this session next session.  Add marching with stab and vectors as glutes are weak (esp on Rt).    PT Home Exercise Plan  08/28/19: hip bridge with 5 sec holds; 2/10 self mobilization wiht LAX, push/pull MET, prone hip extension    Consulted and Agree with Plan of Care  Patient       Patient will benefit from skilled therapeutic intervention in order to improve the following deficits and impairments:  Pain, Improper body mechanics, Postural dysfunction, Decreased activity tolerance,  Decreased strength, Decreased  balance  Visit Diagnosis: Chronic right-sided low back pain without sciatica  Abnormal posture  Dizziness and giddiness     Problem List Patient Active Problem List   Diagnosis Date Noted  . Stiffness of joint, not elsewhere classified, lower leg 11/01/2013  . PONV (postoperative nausea and vomiting)   . History of MRSA infection   . Osteoarthritis of left knee 10/13/2013    2:39 PM, 09/06/19 Jerene Pitch, DPT Physical Therapy with [facility]  [phone] office  [place] [address] [place], [place], [address] Phone: [phone]   Fax:  [phone]  Name: ONIA SHIFLETT MRN: [id number] Date of Birth: 1956-02-11

## 2019-09-11 ENCOUNTER — Ambulatory Visit (HOSPITAL_COMMUNITY): Payer: Medicare PPO | Admitting: Physical Therapy

## 2019-09-11 ENCOUNTER — Encounter (HOSPITAL_COMMUNITY): Payer: Self-pay | Admitting: Physical Therapy

## 2019-09-11 ENCOUNTER — Other Ambulatory Visit: Payer: Self-pay

## 2019-09-11 DIAGNOSIS — M545 Low back pain, unspecified: Secondary | ICD-10-CM

## 2019-09-11 DIAGNOSIS — G8929 Other chronic pain: Secondary | ICD-10-CM

## 2019-09-11 DIAGNOSIS — R293 Abnormal posture: Secondary | ICD-10-CM

## 2019-09-11 NOTE — Therapy (Signed)
[place], [place], [address] Phone: [phone]   Fax:  [phone]  Physical Therapy Treatment  Patient Details  Name: Jasmine Ruiz MRN: [id number] Date of Birth: 1956-05-25 Referring Provider (PT): Jenna Luo MD    Encounter Date: 09/11/2019  PT End of Session - 09/11/19 1402    Visit Number  5    Number of Visits  8    Date for PT Re-Evaluation  09/29/19    Authorization Type  [employer]    Authorization Time Period  08/28/19-09/29/19    Authorization - Visit Number  5    Authorization - Number of Visits  10    PT Start Time  8099    PT Stop Time  1445    PT Time Calculation (min)  50 min    Activity Tolerance  Patient tolerated treatment well    Behavior During Therapy  [facility] for tasks assessed/performed       Past Medical History:  Diagnosis Date  . Arthritis   . History of bronchitis 07/2013  . History of MRSA infection   . Insomnia    takes Ambien nightly as needed  . Joint pain   . Joint swelling   . Migraine    takes Relpax daily as needed;last migraine end of Feb 2015  . Plantar fasciitis   . PONV (postoperative nausea and vomiting)   . Tendonitis    left should  . Vertigo     Past Surgical History:  Procedure Laterality Date  . ABDOMINAL HYSTERECTOMY    . EXPLORATORY LAPAROTOMY    . KNEE ARTHROSCOPY Left 2014  . MOUTH SURGERY    . TOTAL KNEE ARTHROPLASTY Left 10/13/2013   Procedure: TOTAL KNEE ARTHROPLASTY;  Surgeon: Yvette Rack., MD;  Location: [place];  Service: Orthopedics;  Laterality: Left;    There were no vitals filed for this visit.  Subjective Assessment - 09/11/19 1402    Subjective  Patient says things seem to be better. She has been doing her exercise every day. Patient says she was a little tight this AM, but did some stretching and went for a walk and is feeling ok right now.    Currently in Pain?  No/denies                       [facility] Adult  PT Treatment/Exercise - 09/11/19 0001      Lumbar Exercises: Standing   Functional Squats  20 reps    Functional Squats Limitations  to low mat for depth cue, VCs for even weightshifting     Other Standing Lumbar Exercises  SLS with vectors, 2 rounds, 3 x 5" holds, each leg       Lumbar Exercises: Supine   Ab Set  10 reps;5 seconds    Clam  20 reps   1 set 10 no band; 2nd set 10 with RTB   Bent Knee Raise  10 reps    Bridge  10 reps;5 seconds    Straight Leg Raise  10 reps;Limitations               PT Short Term Goals - 08/28/19 1607      PT SHORT TERM GOAL #1   Title  Patient will be independent with initial HEP to improve functional outcomes    Time  2    Period  Weeks    Status  New    Target  Date  09/15/19        PT Long Term Goals - 08/28/19 1607      PT LONG TERM GOAL #1   Title  Patient will improve FOTO score to <25% to indicate improvement in functional outcomes    Time  4    Period  Weeks    Status  New    Target Date  09/29/19      PT LONG TERM GOAL #2   Title  Patient will report at least 75% overall improvement in subjective complaint to indicate improvement in ability to perform ADLs.    Time  4    Period  Weeks    Status  New    Target Date  09/29/19      PT LONG TERM GOAL #3   Title  Patient will have equal to or > 4+/5 MMT throughout RLE to improve ability to perform functional mobility, stair ambulation and ADLs.    Time  4    Period  Weeks    Status  New    Target Date  09/29/19      PT LONG TERM GOAL #4   Title  Patient will be able to sit >1 hour undisturbed due to low back pain in order to read/ watch TV/ eat dinner.    Time  4    Period  Weeks    Status  New            Plan - 09/11/19 1530    Clinical Impression Statement  Patient tolerated session well today Able to progress core and hip strengthening with no increased complaints of pain. Added abdominal marching, patient cued on breathing technique for proper TA  activation. Added red TB to sidelying clamshells. Patient noted increased hip muscle fatigue. Patient required verbal cueing for maintaining even weightbearing during added sit to stands. Also had patient perform in mirror for visual feedback with verbal cues. Patient tolerated SLS with vectors well, showed good static balance. Patient educated on ther ex progressions and updated HEP.    Examination-Activity Limitations  Carry;Sit;Sleep;Squat;Lift    Examination-Participation Restrictions  Community Activity;Yard Work    Stability/Clinical Decision Making  Stable/Uncomplicated    Rehab Potential  Good    PT Frequency  2x / week    PT Duration  4 weeks    PT Treatment/Interventions  ADLs/Self Care Home Management;Therapeutic exercise;Therapeutic activities;Patient/family education;DME Instruction;Balance training;Gait training;Stair training;Functional mobility training;Neuromuscular re-education;Manual techniques;Taping;Joint Manipulations;Spinal Manipulations;Passive range of motion    PT Next Visit Plan  Continue to progress core and hip strengthening as tolerated. Add dead bug, band sidestepping next visit.    PT Home Exercise Plan  08/28/19: hip bridge with 5 sec holds; 2/10 self mobilization wiht LAX, push/pull MET, prone hip extension    Consulted and Agree with Plan of Care  Patient       Patient will benefit from skilled therapeutic intervention in order to improve the following deficits and impairments:  Pain, Improper body mechanics, Postural dysfunction, Decreased activity tolerance, Decreased strength, Decreased balance  Visit Diagnosis: Chronic right-sided low back pain without sciatica  Abnormal posture     Problem List Patient Active Problem List   Diagnosis Date Noted  . Stiffness of joint, not elsewhere classified, lower leg 11/01/2013  . PONV (postoperative nausea and vomiting)   . History of MRSA infection   . Osteoarthritis of left knee 10/13/2013   5:24 PM,  09/11/19 Josue Hector PT DPT  Physical Therapist with [facility]  [facility]  [phone] [place], [place], [address] Phone: [phone]   Fax:  [phone]  Name: Jasmine Ruiz MRN: [id number] Date of Birth: [birth year]

## 2019-09-13 ENCOUNTER — Other Ambulatory Visit: Payer: Self-pay

## 2019-09-13 ENCOUNTER — Ambulatory Visit (HOSPITAL_COMMUNITY): Payer: Medicare PPO | Admitting: Physical Therapy

## 2019-09-13 ENCOUNTER — Encounter (HOSPITAL_COMMUNITY): Payer: Self-pay | Admitting: Physical Therapy

## 2019-09-13 DIAGNOSIS — G8929 Other chronic pain: Secondary | ICD-10-CM

## 2019-09-13 DIAGNOSIS — M545 Low back pain: Secondary | ICD-10-CM | POA: Diagnosis not present

## 2019-09-13 DIAGNOSIS — R293 Abnormal posture: Secondary | ICD-10-CM

## 2019-09-13 NOTE — Therapy (Signed)
[place], [place], [address] Phone: [phone]   Fax:  [phone]  Physical Therapy Treatment  Patient Details  Name: Jasmine Ruiz MRN: [id number] Date of Birth: 01-18-1956 Referring Provider (PT): Jenna Luo MD    Encounter Date: 09/13/2019  PT End of Session - 09/13/19 1353    Visit Number  6    Number of Visits  8    Date for PT Re-Evaluation  09/29/19    Authorization Type  [employer]    Authorization Time Period  08/28/19-09/29/19    Authorization - Visit Number  6    Authorization - Number of Visits  10    PT Start Time  1350    PT Stop Time  1430    PT Time Calculation (min)  40 min    Activity Tolerance  Patient tolerated treatment well    Behavior During Therapy  [facility] for tasks assessed/performed       Past Medical History:  Diagnosis Date  . Arthritis   . History of bronchitis 07/2013  . History of MRSA infection   . Insomnia    takes Ambien nightly as needed  . Joint pain   . Joint swelling   . Migraine    takes Relpax daily as needed;last migraine end of Feb 2015  . Plantar fasciitis   . PONV (postoperative nausea and vomiting)   . Tendonitis    left should  . Vertigo     Past Surgical History:  Procedure Laterality Date  . ABDOMINAL HYSTERECTOMY    . EXPLORATORY LAPAROTOMY    . KNEE ARTHROSCOPY Left 2014  . MOUTH SURGERY    . TOTAL KNEE ARTHROPLASTY Left 10/13/2013   Procedure: TOTAL KNEE ARTHROPLASTY;  Surgeon: Yvette Rack., MD;  Location: [facility];  Service: Orthopedics;  Laterality: Left;    There were no vitals filed for this visit.  Subjective Assessment - 09/13/19 1400    Subjective  Patient says she bent over at the grocery store yesterday and had pain in her hip. Patient says she went home and did her exercises and that they really helped. Says she is still a little tight in back of hip/ lumbar area on RT side, but much better than yesterday.    Currently in  Pain?  Yes    Pain Score  1     Pain Location  Back    Pain Orientation  Right;Posterior    Pain Descriptors / Indicators  Tightness    Pain Type  Acute pain    Pain Onset  Yesterday    Pain Frequency  Intermittent                       [facility] Adult PT Treatment/Exercise - 09/13/19 0001      Lumbar Exercises: Standing   Functional Squats  10 reps    Other Standing Lumbar Exercises  sidestepping at mat RTB 5 x RT       Lumbar Exercises: Supine   Ab Set  10 reps;5 seconds    Clam  10 reps    Clam Limitations  RTB    Bent Knee Raise  10 reps    Dead Bug  10 reps    Dead Bug Limitations  pain in LT knee with bending    Bridge  10 reps;5 seconds    Straight Leg Raise  10 reps;Limitations  PT Education - 09/13/19 1435    Education Details  on TA, and knee joint anatomy and articulation    Person(s) Educated  Patient    Methods  Explanation    Comprehension  Verbalized understanding       PT Short Term Goals - 08/28/19 1607      PT SHORT TERM GOAL #1   Title  Patient will be independent with initial HEP to improve functional outcomes    Time  2    Period  Weeks    Status  New    Target Date  09/15/19        PT Long Term Goals - 08/28/19 1607      PT LONG TERM GOAL #1   Title  Patient will improve FOTO score to <25% to indicate improvement in functional outcomes    Time  4    Period  Weeks    Status  New    Target Date  09/29/19      PT LONG TERM GOAL #2   Title  Patient will report at least 75% overall improvement in subjective complaint to indicate improvement in ability to perform ADLs.    Time  4    Period  Weeks    Status  New    Target Date  09/29/19      PT LONG TERM GOAL #3   Title  Patient will have equal to or > 4+/5 MMT throughout RLE to improve ability to perform functional mobility, stair ambulation and ADLs.    Time  4    Period  Weeks    Status  New    Target Date  09/29/19      PT LONG TERM GOAL #4   Title   Patient will be able to sit >1 hour undisturbed due to low back pain in order to read/ watch TV/ eat dinner.    Time  4    Period  Weeks    Status  New            Plan - 09/13/19 1431    Clinical Impression Statement  Patient tolerated session well overall today. Did note increased LT knee pain with bending knee during added deadbugs. Attempted to modify for less knee flexion, patient still with pain so activity held. Patient educated on anatomy and function of transverse abdominus as well as knee joint anatomy and articulation as possibly source of pain with attempted exercise at todays session. Patient tolerated all other activity well. Patient shows improved WB with sit to stands from mat. Added band sidestepping for improved glute strengthening. Patient educated on and issued red band for resistance progression to clams with HEP.    Examination-Activity Limitations  Carry;Sit;Sleep;Squat;Lift    Examination-Participation Restrictions  Community Activity;Yard Work    Stability/Clinical Decision Making  Stable/Uncomplicated    Rehab Potential  Good    PT Frequency  2x / week    PT Duration  4 weeks    PT Treatment/Interventions  ADLs/Self Care Home Management;Therapeutic exercise;Therapeutic activities;Patient/family education;DME Instruction;Balance training;Gait training;Stair training;Functional mobility training;Neuromuscular re-education;Manual techniques;Taping;Joint Manipulations;Spinal Manipulations;Passive range of motion    PT Next Visit Plan  Continue to progress core and hip strengthening as tolerated. Add palloff press, standing hip abd/ ext next visit    PT Home Exercise Plan  08/28/19: hip bridge with 5 sec holds; 2/10 self mobilization wiht LAX, push/pull MET, prone hip extension    Consulted and Agree with Plan of Care  Patient  Patient will benefit from skilled therapeutic intervention in order to improve the following deficits and impairments:  Pain, Improper body  mechanics, Postural dysfunction, Decreased activity tolerance, Decreased strength, Decreased balance  Visit Diagnosis: Chronic right-sided low back pain without sciatica  Abnormal posture     Problem List Patient Active Problem List   Diagnosis Date Noted  . Stiffness of joint, not elsewhere classified, lower leg 11/01/2013  . PONV (postoperative nausea and vomiting)   . History of MRSA infection   . Osteoarthritis of left knee 10/13/2013   2:37 PM, 09/13/19 Josue Hector PT DPT  Physical Therapist with [facility]  [phone] [place] [address] [place], [place], [address] Phone: [phone]   Fax:  [phone]  Name: Jasmine Ruiz MRN: [id number] Date of Birth: [birth year]

## 2019-09-18 ENCOUNTER — Telehealth (HOSPITAL_COMMUNITY): Payer: Self-pay | Admitting: Physical Therapy

## 2019-09-18 ENCOUNTER — Ambulatory Visit (HOSPITAL_COMMUNITY): Payer: Medicare PPO | Admitting: Physical Therapy

## 2019-09-18 NOTE — Telephone Encounter (Signed)
pt is having stomach issues and can not come in today

## 2019-09-19 ENCOUNTER — Telehealth (HOSPITAL_COMMUNITY): Payer: Self-pay | Admitting: Physical Therapy

## 2019-09-19 NOTE — Telephone Encounter (Signed)
pt not feeling well called to cancel the appts.

## 2019-09-20 ENCOUNTER — Ambulatory Visit (HOSPITAL_COMMUNITY): Payer: Medicare PPO | Admitting: Physical Therapy

## 2019-11-28 ENCOUNTER — Encounter (HOSPITAL_COMMUNITY): Payer: Self-pay | Admitting: Physical Therapy

## 2019-11-28 NOTE — Therapy (Signed)
[place], [place], [address] Phone: [phone]   Fax:  [phone]  Patient Details  Name: Jasmine Ruiz MRN: [id number] Date of Birth: 1956-04-30 Referring Provider:  No ref. provider found  Encounter Date: 11/28/2019  PHYSICAL THERAPY DISCHARGE SUMMARY  Visits from Start of Care: 6  Current functional level related to goals / functional outcomes: Unable to reassess as patient did not return for follow up visits.    Remaining deficits: Unable to reassess as patient did not return for follow up visits.    Education / Equipment: Patient had made some progress, but unable to formally reassess as patient did not return for follow up visit/ formal discharge from therapy. Patient being DC per non compliance. Last visit dated 09/13/19 Plan:                                                    Patient goals were not met. Patient is being discharged due to not returning since the last visit.  ?????         9:59 AM, 11/28/19 Josue Hector PT DPT  Physical Therapist with [facility]  [phone] [place] [address] [place], [place], [address] Phone: [phone]   Fax:  [phone]

## 2019-12-05 ENCOUNTER — Encounter (HOSPITAL_COMMUNITY): Payer: Self-pay

## 2019-12-05 ENCOUNTER — Emergency Department (HOSPITAL_COMMUNITY)
Admission: EM | Admit: 2019-12-05 | Discharge: 2019-12-05 | Disposition: A | Discharge status: Home / Self Care | Payer: Medicare PPO | Attending: Emergency Medicine | Admitting: Emergency Medicine

## 2019-12-05 ENCOUNTER — Other Ambulatory Visit: Payer: Self-pay

## 2019-12-05 DIAGNOSIS — Z7722 Contact with and (suspected) exposure to environmental tobacco smoke (acute) (chronic): Secondary | ICD-10-CM | POA: Diagnosis not present

## 2019-12-05 DIAGNOSIS — W540XXA Bitten by dog, initial encounter: Secondary | ICD-10-CM | POA: Diagnosis not present

## 2019-12-05 DIAGNOSIS — Y93K9 Activity, other involving animal care: Secondary | ICD-10-CM | POA: Diagnosis not present

## 2019-12-05 DIAGNOSIS — Z96652 Presence of left artificial knee joint: Secondary | ICD-10-CM | POA: Diagnosis not present

## 2019-12-05 DIAGNOSIS — S51851A Open bite of right forearm, initial encounter: Secondary | ICD-10-CM

## 2019-12-05 DIAGNOSIS — Y929 Unspecified place or not applicable: Secondary | ICD-10-CM | POA: Insufficient documentation

## 2019-12-05 DIAGNOSIS — Z79899 Other long term (current) drug therapy: Secondary | ICD-10-CM | POA: Insufficient documentation

## 2019-12-05 DIAGNOSIS — S51831A Puncture wound without foreign body of right forearm, initial encounter: Secondary | ICD-10-CM | POA: Insufficient documentation

## 2019-12-05 DIAGNOSIS — S51811A Laceration without foreign body of right forearm, initial encounter: Secondary | ICD-10-CM | POA: Insufficient documentation

## 2019-12-05 DIAGNOSIS — Z23 Encounter for immunization: Secondary | ICD-10-CM | POA: Insufficient documentation

## 2019-12-05 DIAGNOSIS — Y999 Unspecified external cause status: Secondary | ICD-10-CM | POA: Insufficient documentation

## 2019-12-05 MED ORDER — AMOXICILLIN-POT CLAVULANATE 875-125 MG PO TABS
1.0000 | ORAL_TABLET | Freq: Two times a day (BID) | ORAL | 0 refills | Status: DC
Start: 2019-12-05 — End: 2021-04-21

## 2019-12-05 MED ORDER — BACITRACIN ZINC 500 UNIT/GM EX OINT
TOPICAL_OINTMENT | Freq: Once | CUTANEOUS | Status: AC
  Filled 2019-12-05: qty 1.8

## 2019-12-05 MED ORDER — TETANUS-DIPHTH-ACELL PERTUSSIS 5-2.5-18.5 LF-MCG/0.5 IM SUSP
0.5000 mL | Freq: Once | INTRAMUSCULAR | Status: AC
  Administered 2019-12-05: 0.5 mL via INTRAMUSCULAR
  Filled 2019-12-05: qty 0.5

## 2019-12-05 NOTE — ED Triage Notes (Signed)
Pt presents to ED with dog bite on right forearm. Dog was her dog and UTD on immunizations. Last Tdap unknown

## 2019-12-05 NOTE — ED Provider Notes (Signed)
[facility] Provider Note   CSN: [id number] Arrival date & time: 12/05/19  1703     History Chief Complaint  Patient presents with  . Animal Bite    Jasmine Ruiz is a 64 y.o. female.  HPI  65 year old female present status post dog bite.  Patient states that approximately noon today she was playing with her dog.  She states she moved her quickly and her dog bit her right forearm.  She notes a puncture wound and skin tear.  She cleaned the wound immediately and put a dressing on it.  She states she is unsure when her last tetanus shot was.  She states that it was her dog and her dog is up-to-date on all vaccines including rabies.  She denies any other injuries or trauma, difficulty moving her arm, numbness, tingling.      Past Medical History:  Diagnosis Date  . Arthritis   . History of bronchitis 07/2013  . History of MRSA infection   . Insomnia    takes Ambien nightly as needed  . Joint pain   . Joint swelling   . Migraine    takes Relpax daily as needed;last migraine end of Feb 2015  . Plantar fasciitis   . PONV (postoperative nausea and vomiting)   . Tendonitis    left should  . Vertigo     Patient Active Problem List   Diagnosis Date Noted  . Stiffness of joint, not elsewhere classified, lower leg 11/01/2013  . PONV (postoperative nausea and vomiting)   . History of MRSA infection   . Osteoarthritis of left knee 10/13/2013    Past Surgical History:  Procedure Laterality Date  . ABDOMINAL HYSTERECTOMY    . EXPLORATORY LAPAROTOMY    . KNEE ARTHROSCOPY Left 2014  . MOUTH SURGERY    . TOTAL KNEE ARTHROPLASTY Left 10/13/2013   Procedure: TOTAL KNEE ARTHROPLASTY;  Surgeon: Yvette Rack., MD;  Location: [facility];  Service: Orthopedics;  Laterality: Left;     OB History   No obstetric history on file.     Family History  Problem Relation Age of Onset  . Diabetes Maternal Grandfather   . Hypertension Mother   . Alzheimer's disease  Mother     Social History   Tobacco Use  . Smoking status: Passive Smoke Exposure - Never Smoker  . Smokeless tobacco: Never Used  Substance Use Topics  . Alcohol use: No  . Drug use: No    Home Medications Prior to Admission medications   Medication Sig Start Date End Date Taking? Authorizing Provider  cyclobenzaprine (FLEXERIL) 10 MG tablet Take 1 tablet (10 mg total) by mouth 3 (three) times daily as needed for muscle spasms. 08/18/19   Susy Frizzle, MD  eletriptan (RELPAX) 40 MG tablet TAKE 1 TABLET BY MOUTH AS NEEDED MAY REPEAT IN 2 HOURS AS NEEDED FOR MIGRAINE. MAXIMUM 2 TABLETS/DAY. 01/24/16   Susy Frizzle, MD  Melatonin 10 MG TABS Take 10 mg by mouth.     [provider]  Multiple Vitamin (MULTIVITAMIN) tablet Take 1 tablet by mouth daily.    [provider]  vitamin E 100 UNIT capsule Take by mouth daily.    [provider]  zolpidem (AMBIEN) 10 MG tablet Take 10 mg by mouth at bedtime as needed for sleep.    [provider]    Allergies    Hydrocodone, Percocet [oxycodone-acetaminophen], Prednisone, Sulfa antibiotics, and Vicodin [hydrocodone-acetaminophen]  Review of  Systems   Review of Systems  Constitutional: Negative for chills and fever.  Respiratory: Negative for shortness of breath.   Cardiovascular: Negative for chest pain.  Gastrointestinal: Negative for abdominal pain, nausea and vomiting.  Skin: Positive for wound.  All other systems reviewed and are negative.   Physical Exam Updated Vital Signs BP 123/70 (BP Location: Right Arm)   Pulse 97   Temp 98.4 F (36.9 C) (Oral)   Resp 16   Ht 5\' 4"  (1.626 m)   Wt 70.3 kg   SpO2 100%   BMI 26.61 kg/m   Physical Exam Vitals and nursing note reviewed.  Constitutional:      Appearance: She is well-developed.  HENT:     Head: Normocephalic and atraumatic.  Eyes:     Conjunctiva/sclera: Conjunctivae normal.  Pulmonary:     Effort: Pulmonary effort is  normal.  Skin:    Findings: Wound present.       Neurological:     Mental Status: She is alert and oriented to person, place, and time.     ED Results / Procedures / Treatments   Labs (all labs ordered are listed, but only abnormal results are displayed) Labs Reviewed - No data to display  EKG None  Radiology No results found.  Procedures Procedures (including critical care time)  Medications Ordered in ED Medications  Tdap (BOOSTRIX) injection 0.5 mL (has no administration in time range)  bacitracin ointment (has no administration in time range)    ED Course  I have reviewed the triage vital signs and the nursing notes.  Pertinent labs & imaging results that were available during my care of the patient were reviewed by me and considered in my medical decision making (see chart for details).    MDM Rules/Calculators/A&P                       Patient presents status post dog bite.  Was her dog which is up-to-date on vaccines.  She declined rabies vaccinations.  She has 2 wounds to her right mid forearm.  1 puncture wound on the lateral aspect and one small laceration.  Laceration is not gaping, no visible tendon or muscle injury.  She has full range of motion of right upper extremity, neurovascularly intact distally.  Discussed suture versus no suture.  After discussion patient is agreeable without sutures.  She will be placed on Augmentin.  Her tetanus is updated.  Will closely monitor symptoms.  Nursing irrigated wound, apply bacitracin and a dressing.  Patient ready and stable for discharge.    At this time there does not appear to be any evidence of an acute emergency medical condition and the patient appears stable for discharge with appropriate outpatient follow up.Diagnosis was discussed with patient who verbalizes understanding and is agreeable to discharge.    Final Clinical Impression(s) / ED Diagnoses Final diagnoses:  None    Rx / DC Orders ED Discharge  Orders    None       12/05/19 2211    2212, MD 12/06/19 1413

## 2019-12-05 NOTE — ED Notes (Signed)
Pt bit by her dog   Reports dog is UTD on shots

## 2019-12-05 NOTE — Discharge Instructions (Signed)
Take Augmentin as prescribed.  Clean wound twice daily with water and soap and apply a small amount of triple antibiotic ointment.  Follow-up with your primary care provider this week for a wound check.  Return to the emergency room immediately for new or worsening symptoms or concern, such as redness, discharge, foul odor, fevers or any concerns at all.

## 2019-12-08 ENCOUNTER — Encounter: Payer: Self-pay | Admitting: Family Medicine

## 2019-12-08 ENCOUNTER — Other Ambulatory Visit: Payer: Self-pay

## 2019-12-08 ENCOUNTER — Ambulatory Visit (INDEPENDENT_AMBULATORY_CARE_PROVIDER_SITE_OTHER): Payer: Medicare PPO | Admitting: Family Medicine

## 2019-12-08 VITALS — BP 136/76 | HR 84 | Temp 97.4°F | Resp 16 | Ht 65.0 in | Wt 156.0 lb

## 2019-12-08 DIAGNOSIS — W540XXD Bitten by dog, subsequent encounter: Secondary | ICD-10-CM

## 2019-12-08 DIAGNOSIS — F5101 Primary insomnia: Secondary | ICD-10-CM

## 2019-12-08 DIAGNOSIS — S51831A Puncture wound without foreign body of right forearm, initial encounter: Secondary | ICD-10-CM | POA: Diagnosis not present

## 2019-12-08 MED ORDER — ALPRAZOLAM 0.5 MG PO TABS: 0.5000 mg | ORAL_TABLET | Freq: Every evening | ORAL | 0 refills | Status: DC | PRN

## 2019-12-08 NOTE — Progress Notes (Signed)
Subjective:    Patient ID: Jasmine Ruiz, female    DOB: 1956/03/01, 64 y.o.   MRN: [id number]  HPI Patient was playing with her dog on Tuesday.  The dog was getting excited and became aggressive.  It accidentally bit her right forearm on the volar surface.  She has 2 puncture wounds approximately 6 cm down from the antecubital fossa.  Patient went to the emergency room where she received a tetanus shot.  The wound was cleaned thoroughly with saline and she was started on Augmentin for prophylaxis for a total of 7 days.  Today there is bruising on the forearm around the puncture sites.  The patient has no concern about rabies as the dog is well-known to her.  It was an accident.  There is no bleeding.  There is no warmth or erythema or fluctuance.  The wound appears to be healing well.  She is doing a good job of keeping the wound clean.  Patient does report occasional insomnia.  She would like a medication that would help her get to sleep that she could use sparingly just on occasion.  We discussed several options and she is interested in Xanax at a very low dose. Past Medical History:  Diagnosis Date  . Arthritis   . History of bronchitis 07/2013  . History of MRSA infection   . Insomnia    takes Ambien nightly as needed  . Joint pain   . Joint swelling   . Migraine    takes Relpax daily as needed;last migraine end of Feb 2015  . Plantar fasciitis   . PONV (postoperative nausea and vomiting)   . Tendonitis    left should  . Vertigo    Past Surgical History:  Procedure Laterality Date  . ABDOMINAL HYSTERECTOMY    . EXPLORATORY LAPAROTOMY    . KNEE ARTHROSCOPY Left 2014  . MOUTH SURGERY    . TOTAL KNEE ARTHROPLASTY Left 10/13/2013   Procedure: TOTAL KNEE ARTHROPLASTY;  Surgeon: Thera Flake., MD;  Location[facility];  Service: Orthopedics;  Laterality: Left;   Current Outpatient Medications on File Prior to Visit  Medication Sig Dispense Refill  . amoxicillin-clavulanate (AUGMENTIN)  875-125 MG tablet Take 1 tablet by mouth every 12 (twelve) hours. 14 tablet 0  . eletriptan (RELPAX) 40 MG tablet TAKE 1 TABLET BY MOUTH AS NEEDED MAY REPEAT IN 2 HOURS AS NEEDED FOR MIGRAINE. MAXIMUM 2 TABLETS/DAY. 52 tablet 5  . Melatonin 10 MG TABS Take 10 mg by mouth.     . Multiple Vitamin (MULTIVITAMIN) tablet Take 1 tablet by mouth daily.    . vitamin E 100 UNIT capsule Take by mouth daily.     No current facility-administered medications on file prior to visit.   Allergies  Allergen Reactions  . Hydrocodone Anaphylaxis  . Percocet [Oxycodone-Acetaminophen] Anaphylaxis  . Prednisone Swelling  . Sulfa Antibiotics     unknown  . Vicodin [Hydrocodone-Acetaminophen] Nausea And Vomiting   Social History   Socioeconomic History  . Marital status: Married    Spouse name: Not on file  . Number of children: Not on file  . Years of education: Not on file  . Highest education level: Not on file  Occupational History  . Not on file  Tobacco Use  . Smoking status: Passive Smoke Exposure - Never Smoker  . Smokeless tobacco: Never Used  Substance and Sexual Activity  . Alcohol use: No  . Drug use: No  . Sexual activity:  Yes    Birth control/protection: Surgical  Other Topics Concern  . Not on file  Social History Narrative  . Not on file   Social Determinants of Health   Financial Resource Strain:   . Difficulty of Paying Living Expenses:   Food Insecurity:   . Worried About Charity fundraiser in the Last Year:   . Arboriculturist in the Last Year:   Transportation Needs:   . Film/video editor (Medical):   Marland Kitchen Lack of Transportation (Non-Medical):   Physical Activity:   . Days of Exercise per Week:   . Minutes of Exercise per Session:   Stress:   . Feeling of Stress :   Social Connections:   . Frequency of Communication with Friends and Family:   . Frequency of Social Gatherings with Friends and Family:   . Attends Religious Services:   . Active Member of Clubs  or Organizations:   . Attends Archivist Meetings:   Marland Kitchen Marital Status:   Intimate Partner Violence:   . Fear of Current or Ex-Partner:   . Emotionally Abused:   Marland Kitchen Physically Abused:   . Sexually Abused:       Review of Systems  All other systems reviewed and are negative.      Objective:   Physical Exam Vitals reviewed.  Constitutional:      Appearance: Normal appearance. She is normal weight.  Cardiovascular:     Rate and Rhythm: Normal rate and regular rhythm.     Heart sounds: Normal heart sounds.  Pulmonary:     Effort: Pulmonary effort is normal.     Breath sounds: Normal breath sounds.  Musculoskeletal:     Right forearm: Swelling, deformity and laceration present. No tenderness or bony tenderness.       Arms:  Neurological:     Mental Status: She is alert.           Assessment & Plan:  Dog bite, subsequent encounter  Primary insomnia  Wound on the volar surface of the right forearm is healing well.  No evidence of cellulitis or fluctuance.  The wound was covered with Silvadene and wrapped with Coban.  Wound care was discussed.  Finish Augmentin.  We will give the patient Xanax 0.5 mg p.o. nightly as needed sparingly for insomnia.

## 2020-02-20 ENCOUNTER — Other Ambulatory Visit (HOSPITAL_COMMUNITY): Payer: Self-pay | Admitting: Obstetrics & Gynecology

## 2020-02-20 DIAGNOSIS — Z1231 Encounter for screening mammogram for malignant neoplasm of breast: Secondary | ICD-10-CM

## 2020-03-07 DIAGNOSIS — Z1283 Encounter for screening for malignant neoplasm of skin: Secondary | ICD-10-CM | POA: Diagnosis not present

## 2020-03-07 DIAGNOSIS — L258 Unspecified contact dermatitis due to other agents: Secondary | ICD-10-CM | POA: Diagnosis not present

## 2020-03-07 DIAGNOSIS — L821 Other seborrheic keratosis: Secondary | ICD-10-CM | POA: Diagnosis not present

## 2020-03-07 DIAGNOSIS — L218 Other seborrheic dermatitis: Secondary | ICD-10-CM | POA: Diagnosis not present

## 2020-03-25 ENCOUNTER — Ambulatory Visit (HOSPITAL_COMMUNITY)
Admission: RE | Admit: 2020-03-25 | Discharge: 2020-03-25 | Disposition: A | Discharge status: Home / Self Care | Payer: Medicare PPO | Source: Ambulatory Visit | Attending: Obstetrics & Gynecology | Admitting: Obstetrics & Gynecology

## 2020-03-25 ENCOUNTER — Other Ambulatory Visit: Payer: Self-pay

## 2020-03-25 DIAGNOSIS — Z1231 Encounter for screening mammogram for malignant neoplasm of breast: Secondary | ICD-10-CM | POA: Diagnosis not present

## 2020-06-19 DIAGNOSIS — H16223 Keratoconjunctivitis sicca, not specified as Sjogren's, bilateral: Secondary | ICD-10-CM | POA: Diagnosis not present

## 2020-06-28 DIAGNOSIS — M545 Low back pain, unspecified: Secondary | ICD-10-CM | POA: Diagnosis not present

## 2020-06-28 DIAGNOSIS — M7061 Trochanteric bursitis, right hip: Secondary | ICD-10-CM | POA: Diagnosis not present

## 2020-07-08 DIAGNOSIS — M545 Low back pain, unspecified: Secondary | ICD-10-CM | POA: Diagnosis not present

## 2020-08-27 ENCOUNTER — Encounter (HOSPITAL_COMMUNITY): Payer: Self-pay

## 2020-08-27 ENCOUNTER — Ambulatory Visit (HOSPITAL_COMMUNITY): Payer: Medicare PPO | Attending: Physical Medicine & Rehabilitation

## 2020-08-27 ENCOUNTER — Other Ambulatory Visit: Payer: Self-pay

## 2020-08-27 DIAGNOSIS — R262 Difficulty in walking, not elsewhere classified: Secondary | ICD-10-CM | POA: Insufficient documentation

## 2020-08-27 DIAGNOSIS — M25551 Pain in right hip: Secondary | ICD-10-CM | POA: Diagnosis not present

## 2020-08-27 DIAGNOSIS — M545 Low back pain, unspecified: Secondary | ICD-10-CM | POA: Insufficient documentation

## 2020-08-27 DIAGNOSIS — G8929 Other chronic pain: Secondary | ICD-10-CM | POA: Insufficient documentation

## 2020-08-27 DIAGNOSIS — R293 Abnormal posture: Secondary | ICD-10-CM | POA: Insufficient documentation

## 2020-08-27 NOTE — Therapy (Signed)
[facility] [address] [place], [place], [address] Phone: [phone]   Fax:  [phone]  Physical Therapy Evaluation  Patient Details  Name: Jasmine Ruiz MRN: [id number] Date of Birth: 1956-03-08 Referring Provider (PT): Aviva Signs, MD   Encounter Date: 08/27/2020   PT End of Session - 08/27/20 1259    Visit Number 1    Number of Visits 8    Date for PT Re-Evaluation 09/24/20    Authorization Type [employer], no VL, auth required    PT Start Time 1301    PT Stop Time 1356    PT Time Calculation (min) 55 min    Activity Tolerance Patient tolerated treatment well           Past Medical History:  Diagnosis Date  . Arthritis   . History of bronchitis 07/2013  . History of MRSA infection   . Insomnia    takes Ambien nightly as needed  . Joint pain   . Joint swelling   . Migraine    takes Relpax daily as needed;last migraine end of Feb 2015  . Plantar fasciitis   . PONV (postoperative nausea and vomiting)   . Tendonitis    left should  . Vertigo     Past Surgical History:  Procedure Laterality Date  . ABDOMINAL HYSTERECTOMY    . EXPLORATORY LAPAROTOMY    . KNEE ARTHROSCOPY Left 2014  . MOUTH SURGERY    . TOTAL KNEE ARTHROPLASTY Left 10/13/2013   Procedure: TOTAL KNEE ARTHROPLASTY;  Surgeon: Thera Flake., MD;  Location: [facility] OR;  Service: Orthopedics;  Laterality: Left;    There were no vitals filed for this visit.    Subjective Assessment - 08/27/20 1303    Subjective Diagnosed with scoliosis x 1 year ago and noted hx of back pain and right hip pain and received cortisone injection to right hip with relief. Patient reports her hip symptoms change based on activity.  Patient reports use of heating pad to soothe the pain. Denies symptoms of parasthesias. Pt reports walking downs steps causes pain    How long can you walk comfortably? walks 2 miles daily for exercise    Patient Stated Goals Decrease pain  in her hip to increase walking distance and decrease pain with stairs;    Currently in Pain? 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  No    Is the patient reluctant to leave their home because of a fear of falling?  No      Prior Function   Level of Independence Independent    Vocation Retired    Leisure walking      Observation/Other Assessments   Observations pt reports and demonstrates right hip height elevated relative to left. Leg length assessment unrevealing for discrepancy.    Scoliosis pt reports diagnosis x 1 year ago  Focus on Therapeutic Outcomes (FOTO)  40.8% function      ROM / Strength   AROM / PROM / Strength Strength      Strength   Strength Assessment Site Hip    Right/Left Hip Right    Right Hip Flexion 3+/5    Right Hip Extension 3+/5    Right Hip External Rotation  3+/5    Right Hip Internal Rotation 3+/5    Right Hip ABduction 3+/5    Right Hip ADduction 3+/5      Flexibility   Soft Tissue Assessment /Muscle Length yes    ITB decreased pain with Ober position RLE      Palpation   Palpation comment right greater trochanter TTP      Ambulation/Gait   Stairs Yes    Stairs Assistance 6: Modified independent (Device/Increase time)   with antalgia   Stair Management Technique One rail Right;Alternating pattern    Height of Stairs 6                      Objective measurements completed on examination: See above findings.       [facility] Adult PT Treatment/Exercise - 08/27/20 0001      Exercises   Exercises  Knee/Hip      Knee/Hip Exercises: Stretches   Piriformis Stretch Right;2 reps;30 seconds    Other Knee/Hip Stretches Ober position stretch      Knee/Hip Exercises: Standing   Other Standing Knee Exercises lateral steps with band x 2 min green t-band                  PT Education - 08/27/20 1402    Education Details pt education on shoe inserts and benefits of different footwear.  Discussion regarding exam findings    Person(s) Educated Patient    Methods Explanation    Comprehension Verbalized understanding            PT Short Term Goals - 08/27/20 1502      PT SHORT TERM GOAL #1   Title Patient will report at least 25% improvement in symptoms for improved quality of life.    Time 3    Period Weeks    Status New    Target Date 09/17/20      PT SHORT TERM GOAL #2   Title Patient will demo improved right hip strength to 5/5 to improve stair ambulation    Baseline 3+/5    Time 3    Period Weeks    Status New    Target Date 09/17/20      PT SHORT TERM GOAL #3   Title Patient will be independent with HEP in order to improve functional outcomes.    Time 3    Period Weeks    Status New    Target Date 09/17/20             PT Long Term Goals - 08/27/20 1504      PT LONG TERM GOAL #1   Title Patient will improve on FOTO score to meet predicted outcomes to improve functional independence    Time 4    Period Weeks    Status New    Target Date 09/24/20      PT LONG TERM GOAL #2   Title Patient will be able to navigate stairs with reciprocal pattern without compensation in order to demonstrate improved LE strength.    Time 4    Period Weeks  Status New    Target Date 09/24/20                  Plan - 08/27/20 1403    Clinical Impression Statement Patient is 65 yo lady with hx of LBP and more recent onset of right lateral hip pain which limits her activity tolerance and makes it difficult for her to ambulate up/down steps.  Patient reports  pain/RLE weakness limiting functional activities around her home which limits her ability to perform housekeeping tasks.  Patient demonstrates antalgic gait pattern and right hip girdle weakness R>L and would benefit from PT services to improve functional mobility and reduce pain with activity to improve functional independence and return to usual level of activity/exercise without pain/dysfunction    Personal Factors and Comorbidities Comorbidity 1;Comorbidity 2;Time since onset of injury/illness/exacerbation    Comorbidities back pain, scoliosis, hx of L TKR    Examination-Activity Limitations Bend;Carry;Lift;Stand;Stairs;Squat;Locomotion Level    Stability/Clinical Decision Making Stable/Uncomplicated    Clinical Decision Making Low    Rehab Potential Good    PT Frequency 2x / week    PT Duration 4 weeks    PT Treatment/Interventions ADLs/Self Care Home Management;Aquatic Therapy;Cryotherapy;Electrical Stimulation;DME Instruction;Ultrasound;Traction;Moist Heat;Iontophoresis 4mg /ml Dexamethasone;Gait training;Stair training;Functional mobility training;Therapeutic activities;Therapeutic exercise;Balance training;Patient/family education;Neuromuscular re-education;Manual techniques;Passive range of motion;Taping;Energy conservation;Dry needling;Spinal Manipulations;Joint Manipulations    PT Next Visit Plan Continue with HEP development for LE strength, deadlift, etc    PT Home Exercise Plan modified Ober stretch, lateral banded walk, piriformis stretch           Patient will benefit from skilled therapeutic intervention in order to improve the following deficits and impairments:  Abnormal gait,Decreased activity tolerance,Decreased strength,Decreased endurance,Difficulty walking,Impaired perceived functional ability,Improper body mechanics,Postural dysfunction,Pain  Visit Diagnosis: Pain in right hip  Difficulty in walking, not elsewhere classified     Problem List Patient Active Problem  List   Diagnosis Date Noted  . Stiffness of joint, not elsewhere classified, lower leg 11/01/2013  . PONV (postoperative nausea and vomiting)   . History of MRSA infection   . Osteoarthritis of left knee 10/13/2013    3:12 PM, 08/27/20 M. 10/25/20, PT, DPT Physical Therapist- [place] Office Number: [phone]  [facility] [address] [place], [place], [place] Phone: [phone]   Fax:  [phone]  Name: Jasmine Ruiz MRN: [name] Date of Birth: February 28, 1956

## 2020-08-28 ENCOUNTER — Encounter (HOSPITAL_COMMUNITY): Payer: Self-pay

## 2020-08-28 ENCOUNTER — Ambulatory Visit (HOSPITAL_COMMUNITY): Payer: Medicare PPO

## 2020-08-28 DIAGNOSIS — M545 Low back pain, unspecified: Secondary | ICD-10-CM

## 2020-08-28 DIAGNOSIS — M25551 Pain in right hip: Secondary | ICD-10-CM | POA: Diagnosis not present

## 2020-08-28 DIAGNOSIS — G8929 Other chronic pain: Secondary | ICD-10-CM | POA: Diagnosis not present

## 2020-08-28 DIAGNOSIS — R293 Abnormal posture: Secondary | ICD-10-CM | POA: Diagnosis not present

## 2020-08-28 DIAGNOSIS — R262 Difficulty in walking, not elsewhere classified: Secondary | ICD-10-CM | POA: Diagnosis not present

## 2020-08-28 NOTE — Therapy (Signed)
[facility] [address] [place], [place], [address] Phone: [phone]   Fax:  [phone]  Physical Therapy Treatment  Patient Details  Name: Jasmine Ruiz MRN: [id number] Date of Birth: 1956-05-26 Referring Provider (PT): Aviva Signs, MD   Encounter Date: 08/28/2020   PT End of Session - 08/28/20 1351    Visit Number 2    Number of Visits 8    Date for PT Re-Evaluation 09/24/20    Authorization Type [employer], no VL, auth required    PT Start Time 1352    PT Stop Time 1440    PT Time Calculation (min) 48 min    Activity Tolerance Patient tolerated treatment well           Past Medical History:  Diagnosis Date  . Arthritis   . History of bronchitis 07/2013  . History of MRSA infection   . Insomnia    takes Ambien nightly as needed  . Joint pain   . Joint swelling   . Migraine    takes Relpax daily as needed;last migraine end of Feb 2015  . Plantar fasciitis   . PONV (postoperative nausea and vomiting)   . Tendonitis    left should  . Vertigo     Past Surgical History:  Procedure Laterality Date  . ABDOMINAL HYSTERECTOMY    . EXPLORATORY LAPAROTOMY    . KNEE ARTHROSCOPY Left 2014  . MOUTH SURGERY    . TOTAL KNEE ARTHROPLASTY Left 10/13/2013   Procedure: TOTAL KNEE ARTHROPLASTY;  Surgeon: Thera Flake., MD;  Location: [facility] OR;  Service: Orthopedics;  Laterality: Left;    There were no vitals filed for this visit.   Subjective Assessment - 08/28/20 1401    Subjective Patient was seen yesterday and has completed HEP from day 1 at least one time. No further increase in pain noted, but same symptoms    How long can you walk comfortably? walks 2 miles daily for exercise    Patient Stated Goals Decrease pain in her hip to increase walking distance and decrease pain with stairs;                             [facility] Adult PT Treatment/Exercise - 08/28/20 0001      Knee/Hip Exercises:  Stretches   Other Knee/Hip Stretches Ober position stretch 3x 60 sec    Other Knee/Hip Stretches standing side stretch 2x15 sec      Knee/Hip Exercises: Standing   Other Standing Knee Exercises Standing left foot lift-offs from 6" step for RLE single leg stance 15x 3 sec      Knee/Hip Exercises: Sidelying   Hip ABduction Strengthening;Right;5 sets;5 reps    Hip ABduction Limitations 5 lbs with therapist assist for concentric and pt performing eccentric lowering                  PT Education - 08/28/20 1445    Education Details education on tx rationale and hip anatomy.  Discussion regarding research and use of shock wave therapy for hip tendinitis/bursitis to inquire of her MD    Person(s) Educated Patient    Methods Explanation    Comprehension Verbalized understanding            PT Short Term Goals - 08/27/20 1502      PT SHORT TERM GOAL #1   Title Patient will report at least 25% improvement in symptoms  for improved quality of life.    Time 3    Period Weeks    Status New    Target Date 09/17/20      PT SHORT TERM GOAL #2   Title Patient will demo improved right hip strength to 5/5 to improve stair ambulation    Baseline 3+/5    Time 3    Period Weeks    Status New    Target Date 09/17/20      PT SHORT TERM GOAL #3   Title Patient will be independent with HEP in order to improve functional outcomes.    Time 3    Period Weeks    Status New    Target Date 09/17/20             PT Long Term Goals - 08/27/20 1504      PT LONG TERM GOAL #1   Title Patient will improve on FOTO score to meet predicted outcomes to improve functional independence    Time 4    Period Weeks    Status New    Target Date 09/24/20      PT LONG TERM GOAL #2   Title Patient will be able to navigate stairs with reciprocal pattern without compensation in order to demonstrate improved LE strength.    Time 4    Period Weeks    Status New    Target Date 09/24/20                  Plan - 08/28/20 1448    Clinical Impression Statement Patient demonstrates good recall of previous day's tx activities.  Introduced techniques to improve hip abductor recruitment in standing with use of stair for support/offloading to progressively load/improve right single limb stance.  Introduced hip abduction eccentrics for benefit of tendon remodeling and pt educated in techniuqes to self-perform at home.    Personal Factors and Comorbidities Comorbidity 1;Comorbidity 2;Time since onset of injury/illness/exacerbation    Comorbidities back pain, scoliosis, hx of L TKR    Examination-Activity Limitations Bend;Carry;Lift;Stand;Stairs;Squat;Locomotion Level    Stability/Clinical Decision Making Stable/Uncomplicated    Rehab Potential Good    PT Frequency 2x / week    PT Duration 4 weeks    PT Treatment/Interventions ADLs/Self Care Home Management;Aquatic Therapy;Cryotherapy;Electrical Stimulation;DME Instruction;Ultrasound;Traction;Moist Heat;Iontophoresis 4mg /ml Dexamethasone;Gait training;Stair training;Functional mobility training;Therapeutic activities;Therapeutic exercise;Balance training;Patient/family education;Neuromuscular re-education;Manual techniques;Passive range of motion;Taping;Energy conservation;Dry needling;Spinal Manipulations;Joint Manipulations    PT Next Visit Plan Continue with HEP development for LE strength, deadlift, etc    PT Home Exercise Plan modified Ober stretch, lateral banded walk, piriformis stretch           Patient will benefit from skilled therapeutic intervention in order to improve the following deficits and impairments:  Abnormal gait,Decreased activity tolerance,Decreased strength,Decreased endurance,Difficulty walking,Impaired perceived functional ability,Improper body mechanics,Postural dysfunction,Pain  Visit Diagnosis: Pain in right hip  Difficulty in walking, not elsewhere classified  Chronic right-sided low back pain without  sciatica     Problem List Patient Active Problem List   Diagnosis Date Noted  . Stiffness of joint, not elsewhere classified, lower leg 11/01/2013  . PONV (postoperative nausea and vomiting)   . History of MRSA infection   . Osteoarthritis of left knee 10/13/2013   2:50 PM, 08/28/20 M. 10/26/20, PT, DPT Physical Therapist- [place] Office Number: [phone]  [facility] [address] [place], [place], [place] Phone: [phone]   Fax:  [phone]  Name: MICKIE BADDERS MRN:  [id number] Date of Birth: [birth year]

## 2020-08-28 NOTE — Patient Instructions (Signed)
Access Code: [email or web address] URL: [email or web address] Date: 08/28/2020 Prepared by: Shary Decamp  Exercises Clamshell with Resistance - 1 x daily - 7 x weekly - 3 sets - 10 reps Standing Foot Lift on Box (BKA) - 1 x daily - 7 x weekly - 3 sets - 10 reps Sidelying Hip Abduction - 1 x daily - 7 x weekly - 3 sets - 10 reps

## 2020-08-29 ENCOUNTER — Other Ambulatory Visit: Payer: Self-pay | Admitting: Family Medicine

## 2020-09-06 ENCOUNTER — Ambulatory Visit (HOSPITAL_COMMUNITY): Payer: Medicare PPO

## 2020-09-06 ENCOUNTER — Other Ambulatory Visit: Payer: Self-pay

## 2020-09-06 ENCOUNTER — Encounter (HOSPITAL_COMMUNITY): Payer: Self-pay

## 2020-09-06 DIAGNOSIS — R262 Difficulty in walking, not elsewhere classified: Secondary | ICD-10-CM

## 2020-09-06 DIAGNOSIS — R293 Abnormal posture: Secondary | ICD-10-CM | POA: Diagnosis not present

## 2020-09-06 DIAGNOSIS — G8929 Other chronic pain: Secondary | ICD-10-CM | POA: Diagnosis not present

## 2020-09-06 DIAGNOSIS — M545 Low back pain, unspecified: Secondary | ICD-10-CM | POA: Diagnosis not present

## 2020-09-06 DIAGNOSIS — M25551 Pain in right hip: Secondary | ICD-10-CM | POA: Diagnosis not present

## 2020-09-06 NOTE — Therapy (Signed)
[facility] [address] [place], [place], [address] Phone: [phone]   Fax:  [phone]  Physical Therapy Treatment  Patient Details  Name: Jasmine Ruiz MRN: [id number] Date of Birth: 1956-02-13 Referring Provider (PT): Aviva Signs, MD   Encounter Date: 09/06/2020   PT End of Session - 09/06/20 1422    Visit Number 3    Number of Visits 8    Date for PT Re-Evaluation 09/24/20    Authorization Type [employer] Medicare, no VL, auth required    PT Start Time 1350    PT Stop Time 1430    PT Time Calculation (min) 40 min    Activity Tolerance Patient tolerated treatment well           Past Medical History:  Diagnosis Date  . Arthritis   . History of bronchitis 07/2013  . History of MRSA infection   . Insomnia    takes Ambien nightly as needed  . Joint pain   . Joint swelling   . Migraine    takes Relpax daily as needed;last migraine end of Feb 2015  . Plantar fasciitis   . PONV (postoperative nausea and vomiting)   . Tendonitis    left should  . Vertigo     Past Surgical History:  Procedure Laterality Date  . ABDOMINAL HYSTERECTOMY    . EXPLORATORY LAPAROTOMY    . KNEE ARTHROSCOPY Left 2014  . MOUTH SURGERY    . TOTAL KNEE ARTHROPLASTY Left 10/13/2013   Procedure: TOTAL KNEE ARTHROPLASTY;  Surgeon: Thera Flake., MD;  Location: [facility] OR;  Service: Orthopedics;  Laterality: Left;    There were no vitals filed for this visit.   Subjective Assessment - 09/06/20 1422    Subjective Reports continued right hip pain and has attempted use of insoles to correct physiologic leg length but with little effect and reports she will attempt other styles    How long can you walk comfortably? walks 2 miles daily for exercise    Patient Stated Goals Decrease pain in her hip to increase walking distance and decrease pain with stairs;              [facility] PT Assessment - 09/06/20 0001      Assessment   Medical Diagnosis rt  greater trochanteric pain syndrome and lumbar   spondylosis    Referring Provider (PT) Aviva Signs, MD                         [facility] Adult PT Treatment/Exercise - 09/06/20 0001      Knee/Hip Exercises: Standing   Hip Flexion Stengthening;Left;3 sets;10 reps    Hip Flexion Limitations left-foot lift-offs for RLE stance stability with cues for quadricep activiation    Other Standing Knee Exercises lateral steps with red band around midfoot 2x2 min    Other Standing Knee Exercises mini-squat position and withstanding oscillations to facilitate recruitment external rotators 5x15 sec                  PT Education - 09/06/20 1427    Education Details education in HEP updates and emphasis on quad/hip abductor synergy to improve squat and gait mechanics    Person(s) Educated Patient    Methods Explanation    Comprehension Verbalized understanding            PT Short Term Goals - 08/27/20 1502      PT SHORT TERM GOAL #  1   Title Patient will report at least 25% improvement in symptoms for improved quality of life.    Time 3    Period Weeks    Status New    Target Date 09/17/20      PT SHORT TERM GOAL #2   Title Patient will demo improved right hip strength to 5/5 to improve stair ambulation    Baseline 3+/5    Time 3    Period Weeks    Status New    Target Date 09/17/20      PT SHORT TERM GOAL #3   Title Patient will be independent with HEP in order to improve functional outcomes.    Time 3    Period Weeks    Status New    Target Date 09/17/20             PT Long Term Goals - 08/27/20 1504      PT LONG TERM GOAL #1   Title Patient will improve on FOTO score to meet predicted outcomes to improve functional independence    Time 4    Period Weeks    Status New    Target Date 09/24/20      PT LONG TERM GOAL #2   Title Patient will be able to navigate stairs with reciprocal pattern without compensation in order to demonstrate improved LE  strength.    Time 4    Period Weeks    Status New    Target Date 09/24/20                 Plan - 09/06/20 1435    Clinical Impression Statement Demonstrates improved activity tolerance as evident by fewer rest periods between activities,  Cues for proper alignment and execution of maneuvers to avoid compensation.  Continued sessions indicated to reduce hip pain and improve functional activity tolerance    Personal Factors and Comorbidities Comorbidity 1;Comorbidity 2;Time since onset of injury/illness/exacerbation    Comorbidities back pain, scoliosis, hx of L TKR    Examination-Activity Limitations Bend;Carry;Lift;Stand;Stairs;Squat;Locomotion Level    Stability/Clinical Decision Making Stable/Uncomplicated    Rehab Potential Good    PT Frequency 2x / week    PT Duration 4 weeks    PT Treatment/Interventions ADLs/Self Care Home Management;Aquatic Therapy;Cryotherapy;Electrical Stimulation;DME Instruction;Ultrasound;Traction;Moist Heat;Iontophoresis 4mg /ml Dexamethasone;Gait training;Stair training;Functional mobility training;Therapeutic activities;Therapeutic exercise;Balance training;Patient/family education;Neuromuscular re-education;Manual techniques;Passive range of motion;Taping;Energy conservation;Dry needling;Spinal Manipulations;Joint Manipulations    PT Next Visit Plan Continue with HEP development for LE strength, deadlift, etc    PT Home Exercise Plan modified Ober stretch, lateral banded walk, piriformis stretch           Patient will benefit from skilled therapeutic intervention in order to improve the following deficits and impairments:  Abnormal gait,Decreased activity tolerance,Decreased strength,Decreased endurance,Difficulty walking,Impaired perceived functional ability,Improper body mechanics,Postural dysfunction,Pain  Visit Diagnosis: Pain in right hip  Difficulty in walking, not elsewhere classified  Chronic right-sided low back pain without  sciatica     Problem List Patient Active Problem List   Diagnosis Date Noted  . Stiffness of joint, not elsewhere classified, lower leg 11/01/2013  . PONV (postoperative nausea and vomiting)   . History of MRSA infection   . Osteoarthritis of left knee 10/13/2013    2:41 PM, 09/06/20 M. 11/04/20, PT, DPT Physical Therapist- [place] Office Number: [phone]  [facility] [address] [place], [place], [place] Phone: [phone]   Fax:  [phone]  Name: Jasmine Ruiz MRN:  [id number] Date of Birth: [birth year]

## 2020-09-11 ENCOUNTER — Other Ambulatory Visit: Payer: Self-pay

## 2020-09-11 ENCOUNTER — Ambulatory Visit (HOSPITAL_COMMUNITY): Payer: Medicare PPO

## 2020-09-11 DIAGNOSIS — G8929 Other chronic pain: Secondary | ICD-10-CM

## 2020-09-11 DIAGNOSIS — R293 Abnormal posture: Secondary | ICD-10-CM

## 2020-09-11 DIAGNOSIS — R262 Difficulty in walking, not elsewhere classified: Secondary | ICD-10-CM

## 2020-09-11 DIAGNOSIS — M545 Low back pain, unspecified: Secondary | ICD-10-CM | POA: Diagnosis not present

## 2020-09-11 DIAGNOSIS — M25551 Pain in right hip: Secondary | ICD-10-CM | POA: Diagnosis not present

## 2020-09-11 NOTE — Therapy (Signed)
[facility] [address] [place], [place], [address] Phone: [phone]   Fax:  [phone]  Physical Therapy Treatment  Patient Details  Name: Jasmine Ruiz MRN: [id number] Date of Birth: 1955/11/06 Referring Provider (PT): Aviva Signs, MD   Encounter Date: 09/11/2020   PT End of Session - 09/11/20 1737    Visit Number 4    Number of Visits 8    Date for PT Re-Evaluation 09/24/20    Authorization Type [employer], no VL, auth required    PT Start Time 1645    PT Stop Time 1725    PT Time Calculation (min) 40 min    Activity Tolerance Patient tolerated treatment well           Past Medical History:  Diagnosis Date  . Arthritis   . History of bronchitis 07/2013  . History of MRSA infection   . Insomnia    takes Ambien nightly as needed  . Joint pain   . Joint swelling   . Migraine    takes Relpax daily as needed;last migraine end of Feb 2015  . Plantar fasciitis   . PONV (postoperative nausea and vomiting)   . Tendonitis    left should  . Vertigo     Past Surgical History:  Procedure Laterality Date  . ABDOMINAL HYSTERECTOMY    . EXPLORATORY LAPAROTOMY    . KNEE ARTHROSCOPY Left 2014  . MOUTH SURGERY    . TOTAL KNEE ARTHROPLASTY Left 10/13/2013   Procedure: TOTAL KNEE ARTHROPLASTY;  Surgeon: Thera Flake., MD;  Location: [facility];  Service: Orthopedics;  Laterality: Left;    There were no vitals filed for this visit.   Subjective Assessment - 09/11/20 1653    Subjective Reports increase in right lateral leg extending down past the knee along lateral lower leg.  Unable to identify any mechanism other than use of additional shoe lift in her left shoe.    How long can you walk comfortably? 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                  PT Education - 09/11/20 1735    Education Details education and demonstration in leg length correction/accommodation for LLE which manifests improved joint mechanics and symmetry.  Pt provided with education and benefits of corrective footwear and provided with list of vendors in the area that perform shoe modifications to correct leg length    Person(s) Educated Patient    Methods Explanation;Demonstration;Handout    Comprehension Verbalized understanding            PT Short Term  Goals - 08/27/20 1502      PT SHORT TERM GOAL #1   Title Patient will report at least 25% improvement in symptoms for improved quality of life.    Time 3    Period Weeks    Status New    Target Date 09/17/20      PT SHORT TERM GOAL #2   Title Patient will demo improved right hip strength to 5/5 to improve stair ambulation    Baseline 3+/5    Time 3    Period Weeks    Status New    Target Date 09/17/20      PT SHORT TERM GOAL #3   Title Patient will be independent with HEP in order to improve functional outcomes.    Time 3    Period Weeks    Status New    Target Date 09/17/20             PT Long Term Goals - 08/27/20 1504      PT LONG TERM GOAL #1   Title Patient will improve on FOTO score to meet predicted outcomes to improve functional independence    Time 4    Period Weeks    Status New    Target Date 09/24/20      PT LONG TERM GOAL #2   Title Patient will be able  to navigate stairs with reciprocal pattern without compensation in order to demonstrate improved LE strength.    Time 4    Period Weeks    Status New    Target Date 09/24/20                 Plan - 09/11/20 1737    Clinical Impression Statement Improved mechanics with leg length correction which limits her RLE loading which is likely contributing to underlying pathology.  Continue with strengthening exercises in standing position with shims under LLE to assess if this improves symmetry and activity tolerance.    Personal Factors and Comorbidities Comorbidity 1;Comorbidity 2;Time since onset of injury/illness/exacerbation    Comorbidities back pain, scoliosis, hx of L TKR    Examination-Activity Limitations Bend;Carry;Lift;Stand;Stairs;Squat;Locomotion Level    Stability/Clinical Decision Making Stable/Uncomplicated    Rehab Potential Good    PT Frequency 2x / week    PT Duration 4 weeks    PT Treatment/Interventions ADLs/Self Care Home Management;Aquatic Therapy;Cryotherapy;Electrical Stimulation;DME Instruction;Ultrasound;Traction;Moist Heat;Iontophoresis 4mg /ml Dexamethasone;Gait training;Stair training;Functional mobility training;Therapeutic activities;Therapeutic exercise;Balance training;Patient/family education;Neuromuscular re-education;Manual techniques;Passive range of motion;Taping;Energy conservation;Dry needling;Spinal Manipulations;Joint Manipulations    PT Next Visit Plan Trials of squats and standing PRE with shim under LLE to improve leg length symmetry    PT Home Exercise Plan modified Ober stretch, lateral banded walk, piriformis stretch           Patient will benefit from skilled therapeutic intervention in order to improve the following deficits and impairments:  Abnormal gait,Decreased activity tolerance,Decreased strength,Decreased endurance,Difficulty walking,Impaired perceived functional ability,Improper body mechanics,Postural dysfunction,Pain  Visit  Diagnosis: Pain in right hip  Difficulty in walking, not elsewhere classified  Chronic right-sided low back pain without sciatica  Abnormal posture     Problem List Patient Active Problem List   Diagnosis Date Noted  . Stiffness of joint, not elsewhere classified, lower leg 11/01/2013  . PONV (postoperative nausea and vomiting)   . History of MRSA infection   . Osteoarthritis of left knee 10/13/2013   5:40 PM, 09/11/20 M. 09/13/20, PT, DPT Physical Therapist- [place] Office Number: [phone]  [facility] [address] [place], [place], [address] Phone: [phone]   Fax:  [phone]  Name: DONNA SNOOKS MRN: [id number] Date of Birth: 1956/02/10

## 2020-09-16 ENCOUNTER — Encounter (HOSPITAL_COMMUNITY): Payer: Medicare PPO

## 2020-09-18 ENCOUNTER — Encounter (HOSPITAL_COMMUNITY): Payer: Medicare PPO

## 2020-09-23 ENCOUNTER — Ambulatory Visit (HOSPITAL_COMMUNITY): Payer: Medicare PPO

## 2020-09-23 ENCOUNTER — Encounter (HOSPITAL_COMMUNITY): Payer: Self-pay

## 2020-09-23 ENCOUNTER — Other Ambulatory Visit: Payer: Self-pay

## 2020-09-23 DIAGNOSIS — M545 Low back pain, unspecified: Secondary | ICD-10-CM | POA: Diagnosis not present

## 2020-09-23 DIAGNOSIS — G8929 Other chronic pain: Secondary | ICD-10-CM

## 2020-09-23 DIAGNOSIS — R262 Difficulty in walking, not elsewhere classified: Secondary | ICD-10-CM

## 2020-09-23 DIAGNOSIS — R293 Abnormal posture: Secondary | ICD-10-CM | POA: Diagnosis not present

## 2020-09-23 DIAGNOSIS — M25551 Pain in right hip: Secondary | ICD-10-CM | POA: Diagnosis not present

## 2020-09-23 NOTE — Therapy (Signed)
[facility] [address] [place], [place], [address] Phone: [phone]   Fax:  [phone]  Physical Therapy Treatment and Discharge Summary  Patient Details  Name: Jasmine Ruiz MRN: [id number] Date of Birth: [birth year] Referring Provider (PT): Doreatha Martin, MD  PHYSICAL THERAPY DISCHARGE SUMMARY  Visits from Start of Care:5  Current functional level related to goals / functional outcomes: Pt able to meet 2/3 STG and 2/2 LTG   Remaining deficits: Continues to demo gait deviations and difficulty with stair negotiation but mainly due to previous left knee issues   Education / Equipment: Updates in HEP  Plan: Patient agrees to discharge.  Patient goals were partially met. Patient is being discharged due to being pleased with the current functional level.  ?????      Encounter Date: 09/23/2020   PT End of Session - 09/23/20 1301    Visit Number 5    Number of Visits 8    Date for PT Re-Evaluation 09/24/20    Authorization Type [employer], no VL, auth required    PT Start Time 1300    PT Stop Time 1345    PT Time Calculation (min) 45 min    Activity Tolerance Patient tolerated treatment well           Past Medical History:  Diagnosis Date  . Arthritis   . History of bronchitis 07/2013  . History of MRSA infection   . Insomnia    takes Ambien nightly as needed  . Joint pain   . Joint swelling   . Migraine    takes Relpax daily as needed;last migraine end of Feb 2015  . Plantar fasciitis   . PONV (postoperative nausea and vomiting)   . Tendonitis    left should  . Vertigo     Past Surgical History:  Procedure Laterality Date  . ABDOMINAL HYSTERECTOMY    . EXPLORATORY LAPAROTOMY    . KNEE ARTHROSCOPY Left 2014  . MOUTH SURGERY    . TOTAL KNEE ARTHROPLASTY Left 10/13/2013   Procedure: TOTAL KNEE ARTHROPLASTY;  Surgeon: Yvette Rack., MD;  Location: [facility];  Service: Orthopedics;  Laterality: Left;     There were no vitals filed for this visit.   Subjective Assessment - 09/23/20 1328    Subjective Pt reports continued improvement in status and feels like she will pursue corrective footwear to make up for functional leg length.  Overall improved status since starting therapy and reports global improvement in activity tolerance    How long can you walk comfortably? walks 2 miles daily for exercise    Patient Stated Goals Decrease pain in her hip to increase walking distance and decrease pain with stairs;    Currently in Pain? Yes    Pain Score 1     Pain Location Hip    Pain Orientation Right;Posterior    Pain Descriptors / Indicators Aching              [facility] PT Assessment - 09/23/20 0001      Observation/Other Assessments   Focus on Therapeutic Outcomes (FOTO)  71.75% function                         [facility] Adult PT Treatment/Exercise - 09/23/20 0001      Ambulation/Gait   Ambulation/Gait Yes    Ambulation/Gait Assistance 7: Independent    Stairs Yes    Stairs Assistance 6: Modified independent (Device/Increase  time)    Stair Management Technique One rail Right;Alternating pattern    Number of Stairs 6      Knee/Hip Exercises: Standing   Functional Squat 4 sets;10 reps   with shim for leg length discrepancy and mirror for visual feedback   Wall Squat 2 sets;10 reps;3 seconds   with ball squeeze                 PT Education - 09/23/20 1346    Education Details Continued with education in biomechanics and benefits of corrective footwear to correct leg length.  Illustration via Pharmacist, hospital for visual observation    Person(s) Educated Patient    Methods Explanation;Demonstration    Comprehension Verbalized understanding;Returned demonstration            PT Short Term Goals - 09/23/20 1302      PT SHORT TERM GOAL #1   Title Patient will report at least 25% improvement in symptoms for improved quality of life.    Baseline Pt reports  60-75% improvement    Time 3    Period Weeks    Status Achieved    Target Date 09/17/20      PT SHORT TERM GOAL #2   Title Patient will demo improved right hip strength to 5/5 to improve stair ambulation    Baseline 4/5, progressing    Time 3    Period Weeks    Status Not Met    Target Date 09/17/20      PT SHORT TERM GOAL #3   Title Patient will be independent with HEP in order to improve functional outcomes.    Time 3    Period Weeks    Status Achieved    Target Date 09/17/20             PT Long Term Goals - 09/23/20 1410      PT LONG TERM GOAL #1   Title Patient will improve on FOTO score to meet predicted outcomes to improve functional independence    Baseline 40.8% function at start of care. 71.75% function    Time 4    Period Weeks    Status New      PT LONG TERM GOAL #2   Title Patient will be able to navigate stairs with reciprocal pattern without compensation in order to demonstrate improved LE strength.    Baseline with BHR    Time 4    Period Weeks    Status Achieved                 Plan - 09/23/20 1348    Clinical Impression Statement Pt reports and demonstrtaes global improvements with significant increase in FOTO score and pt feels that we have come to a good conclusion with corrective footwear and feels comfortable in proceeding independently on her own at this time    Personal Factors and Comorbidities Comorbidity 1;Comorbidity 2;Time since onset of injury/illness/exacerbation    Comorbidities back pain, scoliosis, hx of L TKR    Examination-Activity Limitations Bend;Carry;Lift;Stand;Stairs;Squat;Locomotion Level    Stability/Clinical Decision Making Stable/Uncomplicated    Rehab Potential Good    PT Frequency 2x / week    PT Duration 4 weeks    PT Treatment/Interventions ADLs/Self Care Home Management;Aquatic Therapy;Cryotherapy;Electrical Stimulation;DME Instruction;Ultrasound;Traction;Moist Heat;Iontophoresis 38m/ml Dexamethasone;Gait  training;Stair training;Functional mobility training;Therapeutic activities;Therapeutic exercise;Balance training;Patient/family education;Neuromuscular re-education;Manual techniques;Passive range of motion;Taping;Energy conservation;Dry needling;Spinal Manipulations;Joint Manipulations    PT Next Visit Plan Pt to D/C to HEP    PT Home  Exercise Plan modified Ober stretch, lateral banded walk, piriformis stretch. Functional squat with shim, wall squat           Patient will benefit from skilled therapeutic intervention in order to improve the following deficits and impairments:  Abnormal gait,Decreased activity tolerance,Decreased strength,Decreased endurance,Difficulty walking,Impaired perceived functional ability,Improper body mechanics,Postural dysfunction,Pain  Visit Diagnosis: Pain in right hip  Difficulty in walking, not elsewhere classified  Chronic right-sided low back pain without sciatica     Problem List Patient Active Problem List   Diagnosis Date Noted  . Stiffness of joint, not elsewhere classified, lower leg 11/01/2013  . PONV (postoperative nausea and vomiting)   . History of MRSA infection   . Osteoarthritis of left knee 10/13/2013   2:16 PM, 09/23/20 M. Sherlyn Lees, PT, DPT Physical Therapist- [place] Office Number: [phone]  [facility] [address] [place], [place], [address] Phone: [phone]   Fax:  [phone]  Name: SARAFINA PUTHOFF MRN: [id number] Date of Birth: 12/14/1955

## 2020-09-25 ENCOUNTER — Ambulatory Visit (HOSPITAL_COMMUNITY): Payer: Medicare PPO

## 2020-09-30 ENCOUNTER — Ambulatory Visit (HOSPITAL_COMMUNITY): Payer: Medicare PPO

## 2020-10-02 ENCOUNTER — Encounter (HOSPITAL_COMMUNITY): Payer: Medicare PPO

## 2020-10-15 DIAGNOSIS — H16223 Keratoconjunctivitis sicca, not specified as Sjogren's, bilateral: Secondary | ICD-10-CM | POA: Diagnosis not present

## 2020-11-07 DIAGNOSIS — M25561 Pain in right knee: Secondary | ICD-10-CM | POA: Diagnosis not present

## 2020-11-18 DIAGNOSIS — M25562 Pain in left knee: Secondary | ICD-10-CM | POA: Diagnosis not present

## 2020-11-19 ENCOUNTER — Other Ambulatory Visit (HOSPITAL_COMMUNITY): Payer: Self-pay | Admitting: Orthopedic Surgery

## 2020-11-19 ENCOUNTER — Other Ambulatory Visit: Payer: Self-pay | Admitting: Orthopedic Surgery

## 2020-11-19 DIAGNOSIS — T84033A Mechanical loosening of internal left knee prosthetic joint, initial encounter: Secondary | ICD-10-CM

## 2020-11-27 ENCOUNTER — Other Ambulatory Visit: Payer: Self-pay

## 2020-11-27 ENCOUNTER — Encounter (HOSPITAL_COMMUNITY)
Admission: RE | Admit: 2020-11-27 | Discharge: 2020-11-27 | Disposition: A | Discharge status: Home / Self Care | Payer: Medicare PPO | Source: Ambulatory Visit | Attending: Orthopedic Surgery | Admitting: Orthopedic Surgery

## 2020-11-27 DIAGNOSIS — T84033A Mechanical loosening of internal left knee prosthetic joint, initial encounter: Secondary | ICD-10-CM | POA: Diagnosis not present

## 2020-11-27 DIAGNOSIS — Z96642 Presence of left artificial hip joint: Secondary | ICD-10-CM | POA: Diagnosis not present

## 2020-11-27 DIAGNOSIS — M7989 Other specified soft tissue disorders: Secondary | ICD-10-CM | POA: Diagnosis not present

## 2020-11-27 DIAGNOSIS — M25562 Pain in left knee: Secondary | ICD-10-CM | POA: Diagnosis not present

## 2020-11-27 MED ORDER — TECHNETIUM TC 99M MEDRONATE IV KIT
20.8000 | PACK | Freq: Once | INTRAVENOUS | Status: AC | PRN
  Administered 2020-11-27: 20.8 via INTRAVENOUS

## 2021-01-02 ENCOUNTER — Telehealth: Payer: Self-pay | Admitting: *Deleted

## 2021-01-02 NOTE — Telephone Encounter (Signed)
Call placed to patient and patient made aware.  

## 2021-01-02 NOTE — Telephone Encounter (Signed)
Received call from patient.   Reports that she has dry chapped lips. States that she was given hydrocortisone butyrate ointment in the past for her lips and requested prescription.   Please advise.

## 2021-01-13 DIAGNOSIS — T8484XA Pain due to internal orthopedic prosthetic devices, implants and grafts, initial encounter: Secondary | ICD-10-CM | POA: Diagnosis not present

## 2021-02-03 DIAGNOSIS — M25562 Pain in left knee: Secondary | ICD-10-CM | POA: Diagnosis not present

## 2021-02-18 ENCOUNTER — Other Ambulatory Visit (HOSPITAL_COMMUNITY): Payer: Self-pay | Admitting: Obstetrics & Gynecology

## 2021-02-18 DIAGNOSIS — Z1231 Encounter for screening mammogram for malignant neoplasm of breast: Secondary | ICD-10-CM

## 2021-03-24 DIAGNOSIS — M25562 Pain in left knee: Secondary | ICD-10-CM | POA: Diagnosis not present

## 2021-03-28 ENCOUNTER — Other Ambulatory Visit: Payer: Self-pay

## 2021-03-28 ENCOUNTER — Ambulatory Visit (HOSPITAL_COMMUNITY)
Admission: RE | Admit: 2021-03-28 | Discharge: 2021-03-28 | Disposition: A | Discharge status: Home / Self Care | Payer: Medicare PPO | Source: Ambulatory Visit | Attending: Obstetrics & Gynecology | Admitting: Obstetrics & Gynecology

## 2021-03-28 DIAGNOSIS — Z1231 Encounter for screening mammogram for malignant neoplasm of breast: Secondary | ICD-10-CM | POA: Insufficient documentation

## 2021-04-07 ENCOUNTER — Other Ambulatory Visit: Payer: Self-pay | Admitting: Family Medicine

## 2021-04-07 NOTE — Telephone Encounter (Signed)
Ok to refill??  Last office visit 12/08/2019.  Last refill 08/29/2020.  Of note, letter sent to patient to schedule appointment.

## 2021-04-21 ENCOUNTER — Encounter: Payer: Self-pay | Admitting: Family Medicine

## 2021-04-21 ENCOUNTER — Other Ambulatory Visit: Payer: Self-pay

## 2021-04-21 ENCOUNTER — Ambulatory Visit (INDEPENDENT_AMBULATORY_CARE_PROVIDER_SITE_OTHER): Payer: Medicare PPO | Admitting: Family Medicine

## 2021-04-21 VITALS — BP 126/76 | HR 72 | Temp 98.1°F | Resp 14 | Ht 65.0 in | Wt 146.0 lb

## 2021-04-21 DIAGNOSIS — Z0000 Encounter for general adult medical examination without abnormal findings: Secondary | ICD-10-CM | POA: Diagnosis not present

## 2021-04-21 DIAGNOSIS — Z1211 Encounter for screening for malignant neoplasm of colon: Secondary | ICD-10-CM

## 2021-04-21 DIAGNOSIS — Z780 Asymptomatic menopausal state: Secondary | ICD-10-CM | POA: Diagnosis not present

## 2021-04-21 NOTE — Progress Notes (Signed)
Subjective:    Patient ID: Jasmine Ruiz, female    DOB: June 14, 1956, 65 y.o.   MRN: [id number]  HPI Patient is here today so that we will refill her medication.  She states that she does not like to come to the doctor unless she absolutely needs to.  Overall she is doing well with no concerns.  Her mammogram is up-to-date.  She had a Pap smear 2 years ago and therefore does not require another one.  She has never had a colonoscopy and after much discussion I was able to talk her into that today.  She is due for a flu shot.  She is due for Pneumovax 23.  She is due for a COVID booster.  She is due for shingles vaccine.  She politely declines all of those vaccinations.  She uses Relpax sparingly for migraines and she uses Xanax sparingly for insomnia as well as anxiety. Past Medical History:  Diagnosis Date   Arthritis    History of bronchitis 07/2013   History of MRSA infection    Insomnia    takes Ambien nightly as needed   Joint pain    Joint swelling    Migraine    takes Relpax daily as needed;last migraine end of Feb 2015   Plantar fasciitis    PONV (postoperative nausea and vomiting)    Tendonitis    left should   Vertigo    Past Surgical History:  Procedure Laterality Date   ABDOMINAL HYSTERECTOMY     EXPLORATORY LAPAROTOMY     KNEE ARTHROSCOPY Left 2014   MOUTH SURGERY     TOTAL KNEE ARTHROPLASTY Left 10/13/2013   Procedure: TOTAL KNEE ARTHROPLASTY;  Surgeon: Thera Flake., MD;  Location: [facility];  Service: Orthopedics;  Laterality: Left;   Current Outpatient Medications on File Prior to Visit  Medication Sig Dispense Refill   ALPRAZolam (XANAX) 0.5 MG tablet TAKE 1 TABLET BY MOUTH EVERY DAY AT BEDTIME AS NEEDED 30 tablet 0   eletriptan (RELPAX) 40 MG tablet TAKE 1 TABLET BY MOUTH AS NEEDED MAY REPEAT IN 2 HOURS AS NEEDED FOR MIGRAINE. MAXIMUM 2 TABLETS/DAY. 52 tablet 5   Melatonin 10 MG TABS Take 10 mg by mouth.      Multiple Vitamin (MULTIVITAMIN) tablet Take 1 tablet by  mouth daily.     vitamin E 100 UNIT capsule Take by mouth daily.     No current facility-administered medications on file prior to visit.     Allergies  Allergen Reactions   Hydrocodone Anaphylaxis   Percocet [Oxycodone-Acetaminophen] Anaphylaxis   Prednisone Swelling   Sulfa Antibiotics     unknown   Vicodin [Hydrocodone-Acetaminophen] Nausea And Vomiting   Social History   Socioeconomic History   Marital status: Married    Spouse name: Not on file   Number of children: Not on file   Years of education: Not on file   Highest education level: Not on file  Occupational History   Not on file  Tobacco Use   Smoking status: Passive Smoke Exposure - Never Smoker   Smokeless tobacco: Never  Substance and Sexual Activity   Alcohol use: No   Drug use: No   Sexual activity: Yes    Birth control/protection: Surgical  Other Topics Concern   Not on file  Social History Narrative   Not on file   Social Determinants of Health   Financial Resource Strain: Not on file  Food Insecurity: Not on file  Transportation Needs:  Not on file  Physical Activity: Not on file  Stress: Not on file  Social Connections: Not on file  Intimate Partner Violence: Not on file      Review of Systems  All other systems reviewed and are negative.     Objective:   Physical Exam Vitals reviewed.  Constitutional:      Appearance: Normal appearance. She is normal weight.  Cardiovascular:     Rate and Rhythm: Normal rate and regular rhythm.     Heart sounds: Normal heart sounds.  Pulmonary:     Effort: Pulmonary effort is normal.     Breath sounds: Normal breath sounds.  Neurological:     Mental Status: She is alert.          Assessment & Plan:  Colon cancer screening - Plan: Ambulatory referral to Gastroenterology  Postmenopausal estrogen deficiency - Plan: DG Bone Density, CBC with Differential/Platelet, COMPLETE METABOLIC PANEL WITH GFR  General medical exam Patient agrees to  allow me to schedule her for a colonoscopy.  Her mammogram is up-to-date.  Her Pap smear is no longer required.  She also agrees to allow me to schedule her for a bone density.  I will check a CBC and a CMP to monitor her liver function test as well as her renal function and check a CBC to rule out any anemia.  Patient politely declines Pneumovax 23, flu shot, COVID booster, and a shingles vaccine

## 2021-04-22 ENCOUNTER — Encounter: Payer: Self-pay | Admitting: *Deleted

## 2021-04-22 LAB — CBC WITH DIFFERENTIAL/PLATELET
Absolute Monocytes: 874 cells/uL (ref 200–950)
Basophils Absolute: 82 cells/uL (ref 0–200)
Basophils Relative: 0.9 %
Eosinophils Absolute: 109 cells/uL (ref 15–500)
Eosinophils Relative: 1.2 %
HCT: 42 % (ref 35.0–45.0)
Hemoglobin: 14 g/dL (ref 11.7–15.5)
Lymphs Abs: 3576 cells/uL (ref 850–3900)
MCH: 31.1 pg (ref 27.0–33.0)
MCHC: 33.3 g/dL (ref 32.0–36.0)
MCV: 93.3 fL (ref 80.0–100.0)
MPV: 10.1 fL (ref 7.5–12.5)
Monocytes Relative: 9.6 %
Neutro Abs: 4459 cells/uL (ref 1500–7800)
Neutrophils Relative %: 49 %
Platelets: 299 10*3/uL (ref 140–400)
RBC: 4.5 10*6/uL (ref 3.80–5.10)
RDW: 12.1 % (ref 11.0–15.0)
Total Lymphocyte: 39.3 %
WBC: 9.1 10*3/uL (ref 3.8–10.8)

## 2021-04-22 LAB — COMPLETE METABOLIC PANEL WITH GFR
AG Ratio: 1.5 (calc) (ref 1.0–2.5)
ALT: 21 U/L (ref 6–29)
AST: 21 U/L (ref 10–35)
Albumin: 4.3 g/dL (ref 3.6–5.1)
Alkaline phosphatase (APISO): 57 U/L (ref 37–153)
BUN: 11 mg/dL (ref 7–25)
CO2: 28 mmol/L (ref 20–32)
Calcium: 9.6 mg/dL (ref 8.6–10.4)
Chloride: 103 mmol/L (ref 98–110)
Creat: 0.74 mg/dL (ref 0.50–1.05)
Globulin: 2.8 g/dL (calc) (ref 1.9–3.7)
Glucose, Bld: 79 mg/dL (ref 65–99)
Potassium: 3.9 mmol/L (ref 3.5–5.3)
Sodium: 140 mmol/L (ref 135–146)
Total Bilirubin: 0.4 mg/dL (ref 0.2–1.2)
Total Protein: 7.1 g/dL (ref 6.1–8.1)
eGFR: 89 mL/min/{1.73_m2} (ref >=60)

## 2021-04-24 ENCOUNTER — Encounter (INDEPENDENT_AMBULATORY_CARE_PROVIDER_SITE_OTHER): Payer: Self-pay | Admitting: *Deleted

## 2021-05-07 ENCOUNTER — Telehealth: Payer: Self-pay | Admitting: *Deleted

## 2021-05-07 DIAGNOSIS — Z1211 Encounter for screening for malignant neoplasm of colon: Secondary | ICD-10-CM

## 2021-05-07 DIAGNOSIS — Z1212 Encounter for screening for malignant neoplasm of rectum: Secondary | ICD-10-CM

## 2021-05-07 NOTE — Telephone Encounter (Signed)
Received call from patient.   Reports that she would like to proceed with Cologuard.   Orders placed.

## 2021-05-14 ENCOUNTER — Other Ambulatory Visit: Payer: Self-pay

## 2021-05-14 ENCOUNTER — Ambulatory Visit (HOSPITAL_COMMUNITY)
Admission: RE | Admit: 2021-05-14 | Discharge: 2021-05-14 | Disposition: A | Discharge status: Home / Self Care | Payer: Medicare PPO | Source: Ambulatory Visit | Attending: Family Medicine | Admitting: Family Medicine

## 2021-05-14 DIAGNOSIS — Z1212 Encounter for screening for malignant neoplasm of rectum: Secondary | ICD-10-CM | POA: Diagnosis not present

## 2021-05-14 DIAGNOSIS — M85832 Other specified disorders of bone density and structure, left forearm: Secondary | ICD-10-CM | POA: Diagnosis not present

## 2021-05-14 DIAGNOSIS — Z780 Asymptomatic menopausal state: Secondary | ICD-10-CM | POA: Diagnosis not present

## 2021-05-14 DIAGNOSIS — M85851 Other specified disorders of bone density and structure, right thigh: Secondary | ICD-10-CM | POA: Diagnosis not present

## 2021-05-14 DIAGNOSIS — Z1211 Encounter for screening for malignant neoplasm of colon: Secondary | ICD-10-CM | POA: Diagnosis not present

## 2021-05-16 ENCOUNTER — Encounter: Payer: Self-pay | Admitting: *Deleted

## 2021-05-20 DIAGNOSIS — M25562 Pain in left knee: Secondary | ICD-10-CM | POA: Diagnosis not present

## 2021-05-21 LAB — COLOGUARD: COLOGUARD: NEGATIVE

## 2021-05-22 ENCOUNTER — Encounter: Payer: Self-pay | Admitting: *Deleted

## 2021-06-02 ENCOUNTER — Other Ambulatory Visit: Payer: Self-pay | Admitting: *Deleted

## 2021-06-02 NOTE — Telephone Encounter (Signed)
Received call from patient.   Requested refill on Xanax.   Ok to refill??  Last office visit 04/21/2021.  Last refill 04/08/2021.

## 2021-06-03 MED ORDER — ALPRAZOLAM 0.5 MG PO TABS: ORAL_TABLET | ORAL | 0 refills | Status: DC

## 2021-08-21 DIAGNOSIS — M25562 Pain in left knee: Secondary | ICD-10-CM | POA: Diagnosis not present

## 2021-12-04 DIAGNOSIS — M25561 Pain in right knee: Secondary | ICD-10-CM | POA: Diagnosis not present

## 2022-02-05 DIAGNOSIS — M25561 Pain in right knee: Secondary | ICD-10-CM | POA: Diagnosis not present

## 2022-02-25 ENCOUNTER — Other Ambulatory Visit (HOSPITAL_COMMUNITY): Payer: Self-pay | Admitting: Obstetrics & Gynecology

## 2022-02-25 DIAGNOSIS — Z1231 Encounter for screening mammogram for malignant neoplasm of breast: Secondary | ICD-10-CM

## 2022-04-02 ENCOUNTER — Ambulatory Visit (HOSPITAL_COMMUNITY)
Admission: RE | Admit: 2022-04-02 | Discharge: 2022-04-02 | Disposition: A | Discharge status: Home / Self Care | Payer: Medicare PPO | Source: Ambulatory Visit | Attending: Obstetrics & Gynecology | Admitting: Obstetrics & Gynecology

## 2022-04-02 DIAGNOSIS — Z1231 Encounter for screening mammogram for malignant neoplasm of breast: Secondary | ICD-10-CM | POA: Insufficient documentation

## 2022-04-14 ENCOUNTER — Ambulatory Visit: Payer: Medicare PPO | Admitting: Obstetrics & Gynecology

## 2022-04-27 ENCOUNTER — Ambulatory Visit (INDEPENDENT_AMBULATORY_CARE_PROVIDER_SITE_OTHER): Payer: Medicare PPO | Admitting: Obstetrics & Gynecology

## 2022-04-27 ENCOUNTER — Encounter: Payer: Self-pay | Admitting: Obstetrics & Gynecology

## 2022-04-27 VITALS — BP 121/79 | HR 83 | Ht 64.0 in | Wt 152.0 lb

## 2022-04-27 DIAGNOSIS — Z1211 Encounter for screening for malignant neoplasm of colon: Secondary | ICD-10-CM | POA: Diagnosis not present

## 2022-04-27 DIAGNOSIS — Z01419 Encounter for gynecological examination (general) (routine) without abnormal findings: Secondary | ICD-10-CM

## 2022-04-27 DIAGNOSIS — Z1212 Encounter for screening for malignant neoplasm of rectum: Secondary | ICD-10-CM | POA: Diagnosis not present

## 2022-04-27 NOTE — Progress Notes (Signed)
Subjective:     Jasmine Ruiz is a 66 y.o. female here for a routine exam.  No LMP recorded. Patient has had a hysterectomy. No obstetric history on file. Birth Control Method:  hysterectomy + menoapusal Menstrual Calendar(currently): na  Current complaints: none.   Current acute medical issues:  none   Recent Gynecologic History No LMP recorded. Patient has had a hysterectomy. Last Pap: na,   Last mammogram: 04/02/22,  normal  Past Medical History:  Diagnosis Date   Arthritis    History of bronchitis 07/2013   History of MRSA infection    Insomnia    takes Ambien nightly as needed   Joint pain    Joint swelling    Migraine    takes Relpax daily as needed;last migraine end of Feb 2015   Plantar fasciitis    PONV (postoperative nausea and vomiting)    Scoliosis    Tendonitis    left should   Vertigo     Past Surgical History:  Procedure Laterality Date   ABDOMINAL HYSTERECTOMY     EXPLORATORY LAPAROTOMY     KNEE ARTHROSCOPY Left 2014   MOUTH SURGERY     TOTAL KNEE ARTHROPLASTY Left 10/13/2013   Procedure: TOTAL KNEE ARTHROPLASTY;  Surgeon: Thera Flake., MD;  Location: [facility] OR;  Service: Orthopedics;  Laterality: Left;    OB History   No obstetric history on file.     Social History   Socioeconomic History   Marital status: Married    Spouse name: Not on file   Number of children: Not on file   Years of education: Not on file   Highest education level: Not on file  Occupational History   Not on file  Tobacco Use   Smoking status: Never    Passive exposure: Yes   Smokeless tobacco: Never  Vaping Use   Vaping Use: Never used  Substance and Sexual Activity   Alcohol use: No   Drug use: No   Sexual activity: Yes    Birth control/protection: Surgical  Other Topics Concern   Not on file  Social History Narrative   Not on file   Social Determinants of Health   Financial Resource Strain: Low Risk  (04/27/2022)   Overall Financial Resource Strain  (CARDIA)    Difficulty of Paying Living Expenses: Not hard at all  Food Insecurity: No Food Insecurity (04/27/2022)   Hunger Vital Sign    Worried About Running Out of Food in the Last Year: Never true    Ran Out of Food in the Last Year: Never true  Transportation Needs: No Transportation Needs (04/27/2022)   PRAPARE - Administrator, Civil Service (Medical): No    Lack of Transportation (Non-Medical): No  Physical Activity: Sufficiently Active (04/27/2022)   Exercise Vital Sign    Days of Exercise per Week: 7 days    Minutes of Exercise per Session: 30 min  Stress: Stress Concern Present (04/27/2022)   [employer] of Occupational Health - Occupational Stress Questionnaire    Feeling of Stress : To some extent  Social Connections: Moderately Isolated (04/27/2022)   Social Connection and Isolation Panel [NHANES]    Frequency of Communication with Friends and Family: Three times a week    Frequency of Social Gatherings with Friends and Family: Patient refused    Attends Religious Services: Never    Database administrator or Organizations: No    Attends Banker Meetings: Never  Marital Status: Married    Family History  Problem Relation Age of Onset   Diabetes Maternal Grandfather    Hypertension Mother    Alzheimer's disease Mother      Current Outpatient Medications:    ALPRAZolam (XANAX) 0.5 MG tablet, TAKE 1 TABLET BY MOUTH EVERY DAY AT BEDTIME AS NEEDED, Disp: 30 tablet, Rfl: 0   Ascorbic Acid (VITAMIN C) 1000 MG tablet, Take 1,000 mg by mouth daily., Disp: , Rfl:    eletriptan (RELPAX) 40 MG tablet, TAKE 1 TABLET BY MOUTH AS NEEDED MAY REPEAT IN 2 HOURS AS NEEDED FOR MIGRAINE. MAXIMUM 2 TABLETS/DAY., Disp: 52 tablet, Rfl: 5   Melatonin 10 MG TABS, Take 10 mg by mouth. , Disp: , Rfl:    Multiple Vitamin (MULTIVITAMIN) tablet, Take 1 tablet by mouth daily., Disp: , Rfl:    vitamin E 100 UNIT capsule, Take by mouth daily., Disp: , Rfl:   Review  of Systems  Review of Systems  Constitutional: Negative for fever, chills, weight loss, malaise/fatigue and diaphoresis.  HENT: Negative for hearing loss, ear pain, nosebleeds, congestion, sore throat, neck pain, tinnitus and ear discharge.   Eyes: Negative for blurred vision, double vision, photophobia, pain, discharge and redness.  Respiratory: Negative for cough, hemoptysis, sputum production, shortness of breath, wheezing and stridor.   Cardiovascular: Negative for chest pain, palpitations, orthopnea, claudication, leg swelling and PND.  Gastrointestinal: negative for abdominal pain. Negative for heartburn, nausea, vomiting, diarrhea, constipation, blood in stool and melena.  Genitourinary: Negative for dysuria, urgency, frequency, hematuria and flank pain.  Musculoskeletal: Negative for myalgias, back pain, joint pain and falls.  Skin: Negative for itching and rash.  Neurological: Negative for dizziness, tingling, tremors, sensory change, speech change, focal weakness, seizures, loss of consciousness, weakness and headaches.  Endo/Heme/Allergies: Negative for environmental allergies and polydipsia. Does not bruise/bleed easily.  Psychiatric/Behavioral: Negative for depression, suicidal ideas, hallucinations, memory loss and substance abuse. The patient is not nervous/anxious and does not have insomnia.        Objective:  Blood pressure 121/79, pulse 83, height 5\' 4"  (1.626 m), weight 152 lb (68.9 kg).   Physical Exam  Vitals reviewed. Constitutional: She is oriented to person, place, and time. She appears well-developed and well-nourished.  HENT:  Head: Normocephalic and atraumatic.        Right Ear: External ear normal.  Left Ear: External ear normal.  Nose: Nose normal.  Mouth/Throat: Oropharynx is clear and moist.  Eyes: Conjunctivae and EOM are normal. Pupils are equal, round, and reactive to light. Right eye exhibits no discharge. Left eye exhibits no discharge. No scleral  icterus.  Neck: Normal range of motion. Neck supple. No tracheal deviation present. No thyromegaly present.  Cardiovascular: Normal rate, regular rhythm, normal heart sounds and intact distal pulses.  Exam reveals no gallop and no friction rub.   No murmur heard. Respiratory: Effort normal and breath sounds normal. No respiratory distress. She has no wheezes. She has no rales. She exhibits no tenderness.  GI: Soft. Bowel sounds are normal. She exhibits no distension and no mass. There is no tenderness. There is no rebound and no guarding.  Genitourinary:  Breasts no masses skin changes or nipple changes bilaterally      Vulva is normal without lesions Vagina is pink moist without discharge Cervix absent and pap is not done Uterus is absent Adnexa is negative  {Rectal    hemoccult negative, normal tone, no masses  Musculoskeletal: Normal range of motion. She exhibits  no edema and no tenderness.  Neurological: She is alert and oriented to person, place, and time. She has normal reflexes. She displays normal reflexes. No cranial nerve deficit. She exhibits normal muscle tone. Coordination normal.  Skin: Skin is warm and dry. No rash noted. No erythema. No pallor.  Psychiatric: She has a normal mood and affect. Her behavior is normal. Judgment and thought content normal.       Medications Ordered at today's visit: No orders of the defined types were placed in this encounter.   Other orders placed at today's visit: No orders of the defined types were placed in this encounter.     Assessment:    Normal Gyn exam.   menopausal Plan:    Contraception: status post hysterectomy. Follow up in: 3 years.     Return in about 3 years (around 04/27/2025) for Follow up, yearly, with Dr Elonda Husky.

## 2022-07-28 ENCOUNTER — Ambulatory Visit (INDEPENDENT_AMBULATORY_CARE_PROVIDER_SITE_OTHER): Payer: Medicare PPO

## 2022-07-28 ENCOUNTER — Other Ambulatory Visit: Payer: Self-pay

## 2022-07-28 VITALS — BP 124/70 | Ht 65.0 in | Wt 154.0 lb

## 2022-07-28 DIAGNOSIS — Z0000 Encounter for general adult medical examination without abnormal findings: Secondary | ICD-10-CM | POA: Diagnosis not present

## 2022-07-28 MED ORDER — HYDROCORTISONE BUTYRATE 0.1 % EX CREA: 1.0000 | TOPICAL_CREAM | Freq: Two times a day (BID) | CUTANEOUS | 0 refills | Status: AC

## 2022-07-28 MED ORDER — ALPRAZOLAM 0.5 MG PO TABS: ORAL_TABLET | ORAL | 0 refills | Status: AC

## 2022-07-28 NOTE — Telephone Encounter (Signed)
Patient seen for Annual Wellness Visit and is requesting refills of Xanax and Hydrocortisone cream that she was prescribed in the past for dermatitis.

## 2022-07-28 NOTE — Patient Instructions (Addendum)
Jasmine Ruiz , Thank you for taking time to come for your Medicare Wellness Visit. I appreciate your ongoing commitment to your health goals. Please review the following plan we discussed and let me know if I can assist you in the future.   These are the goals we discussed:  Goals      Patient Stated     Maintain activity level         This is a list of the screening recommended for you and due dates:  Health Maintenance  Topic Date Due   COVID-19 Vaccine (1) Never done   Hepatitis C Screening: USPSTF Recommendation to screen - Ages 58-79 yo.  Never done   Colon Cancer Screening  Never done   Zoster (Shingles) Vaccine (1 of 2) Never done   Pneumonia Vaccine (1 - PCV) Never done   Flu Shot  Never done   Medicare Annual Wellness Visit  07/29/2023   Mammogram  04/02/2024   DTaP/Tdap/Td vaccine (3 - Td or Tdap) 12/04/2029   DEXA scan (bone density measurement)  Completed   HPV Vaccine  Aged Out    Advanced directives: Advance directive discussed with you today. I have provided a copy for you to complete at home and have notarized. Once this is complete please bring a copy in to our office so we can scan it into your chart.   Conditions/risks identified: Aim for 30 minutes of exercise or brisk walking, 6-8 glasses of water, and 5 servings of fruits and vegetables each day.   Next appointment: Follow up in one year for your annual wellness visit    Preventive Care 65 Years and Older, Female Preventive care refers to lifestyle choices and visits with your health care provider that can promote health and wellness. What does preventive care include? A yearly physical exam. This is also called an annual well check. Dental exams once or twice a year. Routine eye exams. Ask your health care provider how often you should have your eyes checked. Personal lifestyle choices, including: Daily care of your teeth and gums. Regular physical activity. Eating a healthy diet. Avoiding tobacco and  drug use. Limiting alcohol use. Practicing safe sex. Taking low-dose aspirin every day. Taking vitamin and mineral supplements as recommended by your health care provider. What happens during an annual well check? The services and screenings done by your health care provider during your annual well check will depend on your age, overall health, lifestyle risk factors, and family history of disease. Counseling  Your health care provider may ask you questions about your: Alcohol use. Tobacco use. Drug use. Emotional well-being. Home and relationship well-being. Sexual activity. Eating habits. History of falls. Memory and ability to understand (cognition). Work and work Statistician. Reproductive health. Screening  You may have the following tests or measurements: Height, weight, and BMI. Blood pressure. Lipid and cholesterol levels. These may be checked every 5 years, or more frequently if you are over 73 years old. Skin check. Lung cancer screening. You may have this screening every year starting at age 33 if you have a 30-pack-year history of smoking and currently smoke or have quit within the past 15 years. Fecal occult blood test (FOBT) of the stool. You may have this test every year starting at age 46. Flexible sigmoidoscopy or colonoscopy. You may have a sigmoidoscopy every 5 years or a colonoscopy every 10 years starting at age 7. Hepatitis C blood test. Hepatitis B blood test. Sexually transmitted disease (STD) testing. Diabetes screening.  This is done by checking your blood sugar (glucose) after you have not eaten for a while (fasting). You may have this done every 1-3 years. Bone density scan. This is done to screen for osteoporosis. You may have this done starting at age 48. Mammogram. This may be done every 1-2 years. Talk to your health care provider about how often you should have regular mammograms. Talk with your health care provider about your test results, treatment  options, and if necessary, the need for more tests. Vaccines  Your health care provider may recommend certain vaccines, such as: Influenza vaccine. This is recommended every year. Tetanus, diphtheria, and acellular pertussis (Tdap, Td) vaccine. You may need a Td booster every 10 years. Zoster vaccine. You may need this after age 34. Pneumococcal 13-valent conjugate (PCV13) vaccine. One dose is recommended after age 69. Pneumococcal polysaccharide (PPSV23) vaccine. One dose is recommended after age 53. Talk to your health care provider about which screenings and vaccines you need and how often you need them. This information is not intended to replace advice given to you by your health care provider. Make sure you discuss any questions you have with your health care provider. Document Released: 08/09/2015 Document Revised: 04/01/2016 Document Reviewed: 05/14/2015 Elsevier Interactive Patient Education  2017 [place] Prevention in the Home Falls can cause injuries. They can happen to people of all ages. There are many things you can do to make your home safe and to help prevent falls. What can I do on the outside of my home? Regularly fix the edges of walkways and driveways and fix any cracks. Remove anything that might make you trip as you walk through a door, such as a raised step or threshold. Trim any bushes or trees on the path to your home. Use bright outdoor lighting. Clear any walking paths of anything that might make someone trip, such as rocks or tools. Regularly check to see if handrails are loose or broken. Make sure that both sides of any steps have handrails. Any raised decks and porches should have guardrails on the edges. Have any leaves, snow, or ice cleared regularly. Use sand or salt on walking paths during winter. Clean up any spills in your garage right away. This includes oil or grease spills. What can I do in the bathroom? Use night lights. Install grab  bars by the toilet and in the tub and shower. Do not use towel bars as grab bars. Use non-skid mats or decals in the tub or shower. If you need to sit down in the shower, use a plastic, non-slip stool. Keep the floor dry. Clean up any water that spills on the floor as soon as it happens. Remove soap buildup in the tub or shower regularly. Attach bath mats securely with double-sided non-slip rug tape. Do not have throw rugs and other things on the floor that can make you trip. What can I do in the bedroom? Use night lights. Make sure that you have a light by your bed that is easy to reach. Do not use any sheets or blankets that are too big for your bed. They should not hang down onto the floor. Have a firm chair that has side arms. You can use this for support while you get dressed. Do not have throw rugs and other things on the floor that can make you trip. What can I do in the kitchen? Clean up any spills right away. Avoid walking on wet floors. Keep items  that you use a lot in easy-to-reach places. If you need to reach something above you, use a strong step stool that has a grab bar. Keep electrical cords out of the way. Do not use floor polish or wax that makes floors slippery. If you must use wax, use non-skid floor wax. Do not have throw rugs and other things on the floor that can make you trip. What can I do with my stairs? Do not leave any items on the stairs. Make sure that there are handrails on both sides of the stairs and use them. Fix handrails that are broken or loose. Make sure that handrails are as long as the stairways. Check any carpeting to make sure that it is firmly attached to the stairs. Fix any carpet that is loose or worn. Avoid having throw rugs at the top or bottom of the stairs. If you do have throw rugs, attach them to the floor with carpet tape. Make sure that you have a light switch at the top of the stairs and the bottom of the stairs. If you do not have them,  ask someone to add them for you. What else can I do to help prevent falls? Wear shoes that: Do not have high heels. Have rubber bottoms. Are comfortable and fit you well. Are closed at the toe. Do not wear sandals. If you use a stepladder: Make sure that it is fully opened. Do not climb a closed stepladder. Make sure that both sides of the stepladder are locked into place. Ask someone to hold it for you, if possible. Clearly mark and make sure that you can see: Any grab bars or handrails. First and last steps. Where the edge of each step is. Use tools that help you move around (mobility aids) if they are needed. These include: Canes. Walkers. Scooters. Crutches. Turn on the lights when you go into a dark area. Replace any light bulbs as soon as they burn out. Set up your furniture so you have a clear path. Avoid moving your furniture around. If any of your floors are uneven, fix them. If there are any pets around you, be aware of where they are. Review your medicines with your doctor. Some medicines can make you feel dizzy. This can increase your chance of falling. Ask your doctor what other things that you can do to help prevent falls. This information is not intended to replace advice given to you by your health care provider. Make sure you discuss any questions you have with your health care provider. Document Released: 05/09/2009 Document Revised: 12/19/2015 Document Reviewed: 08/17/2014 Elsevier Interactive Patient Education  2017 [employer].

## 2022-07-28 NOTE — Progress Notes (Signed)
Subjective:   Jasmine Ruiz is a 67 y.o. female who presents for Medicare Annual (Subsequent) preventive examination.  Review of Systems     Cardiac Risk Factors include: advanced age (>68men, >82 women)     Objective:    Today's Vitals   07/28/22 1404  BP: 124/70  Weight: 154 lb (69.9 kg)  Height: 5\' 5"  (1.651 m)   Body mass index is 25.63 kg/m.     07/28/2022    2:49 PM 08/27/2020    1:11 PM 12/05/2019    5:20 PM 04/26/2019    4:17 PM 10/03/2013   10:08 AM  Advanced Directives  Does Patient Have a Medical Advance Directive? No Yes No No Patient does not have advance directive;Patient would not like information  Does patient want to make changes to medical advance directive?  Yes (Inpatient - patient defers changing a medical advance directive at this time - Information given)     Would patient like information on creating a medical advance directive? Yes (MAU/Ambulatory/Procedural Areas - Information given)   No - Patient declined   Pre-existing out of facility DNR order (yellow form or pink MOST form)     No    Current Medications (verified) Outpatient Encounter Medications as of 07/28/2022  Medication Sig   ALPRAZolam (XANAX) 0.5 MG tablet TAKE 1 TABLET BY MOUTH EVERY DAY AT BEDTIME AS NEEDED   Ascorbic Acid (VITAMIN C) 1000 MG tablet Take 1,000 mg by mouth daily.   eletriptan (RELPAX) 40 MG tablet TAKE 1 TABLET BY MOUTH AS NEEDED MAY REPEAT IN 2 HOURS AS NEEDED FOR MIGRAINE. MAXIMUM 2 TABLETS/DAY.   hydrocortisone butyrate (LUCOID) 0.1 % CREA cream Apply 1 Application topically 2 (two) times daily.   Melatonin 10 MG TABS Take 10 mg by mouth.    Multiple Vitamin (MULTIVITAMIN) tablet Take 1 tablet by mouth daily.   vitamin E 100 UNIT capsule Take by mouth daily.   No facility-administered encounter medications on file as of 07/28/2022.    Allergies (verified) Hydrocodone, Percocet [oxycodone-acetaminophen], Prednisone, Sulfa antibiotics, and Vicodin  [hydrocodone-acetaminophen]   History: Past Medical History:  Diagnosis Date   Arthritis    History of bronchitis 07/2013   History of MRSA infection    Insomnia    takes Ambien nightly as needed   Joint pain    Joint swelling    Migraine    takes Relpax daily as needed;last migraine end of Feb 2015   Plantar fasciitis    PONV (postoperative nausea and vomiting)    Scoliosis    Tendonitis    left should   Vertigo    Past Surgical History:  Procedure Laterality Date   ABDOMINAL HYSTERECTOMY     EXPLORATORY LAPAROTOMY     KNEE ARTHROSCOPY Left 2014   MOUTH SURGERY     TOTAL KNEE ARTHROPLASTY Left 10/13/2013   Procedure: TOTAL KNEE ARTHROPLASTY;  Surgeon: Yvette Rack., MD;  Location: [facility];  Service: Orthopedics;  Laterality: Left;   Family History  Problem Relation Age of Onset   Diabetes Maternal Grandfather    Hypertension Mother    Alzheimer's disease Mother    Social History   Socioeconomic History   Marital status: Married    Spouse name: Not on file   Number of children: Not on file   Years of education: Not on file   Highest education level: Not on file  Occupational History   Not on file  Tobacco Use   Smoking status: Never  Passive exposure: Yes   Smokeless tobacco: Never  Vaping Use   Vaping Use: Never used  Substance and Sexual Activity   Alcohol use: No   Drug use: No   Sexual activity: Yes    Birth control/protection: Surgical  Other Topics Concern   Not on file  Social History Narrative   Retired Education officer, museum    Social Determinants of Health   Financial Resource Strain: Low Risk  (07/28/2022)   Overall Financial Resource Strain (CARDIA)    Difficulty of Paying Living Expenses: Not hard at all  Food Insecurity: No Food Insecurity (07/28/2022)   Hunger Vital Sign    Worried About Running Out of Food in the Last Year: Never true    Ran Out of Food in the Last Year: Never true  Transportation Needs: No Transportation Needs  (07/28/2022)   PRAPARE - Administrator, Civil Service (Medical): No    Lack of Transportation (Non-Medical): No  Physical Activity: Sufficiently Active (07/28/2022)   Exercise Vital Sign    Days of Exercise per Week: 7 days    Minutes of Exercise per Session: 40 min  Stress: No Stress Concern Present (07/28/2022)   [employer] of Occupational Health - Occupational Stress Questionnaire    Feeling of Stress : Not at all  Social Connections: Moderately Isolated (07/28/2022)   Social Connection and Isolation Panel [NHANES]    Frequency of Communication with Friends and Family: More than three times a week    Frequency of Social Gatherings with Friends and Family: Once a week    Attends Religious Services: Never    Database administrator or Organizations: No    Attends Engineer, structural: Never    Marital Status: Married    Tobacco Counseling Counseling given: Not Answered   Clinical Intake:  Pre-visit preparation completed: Yes  Pain : No/denies pain  Diabetes: No  How often do you need to have someone help you when you read instructions, pamphlets, or other written materials from your doctor or pharmacy?: 1 - Never  Diabetic?No   Interpreter Needed?: No  Information entered by :: Kandis Fantasia LPN   Activities of Daily Living    07/28/2022    2:50 PM  In your present state of health, do you have any difficulty performing the following activities:  Hearing? 0  Vision? 0  Difficulty concentrating or making decisions? 0  Walking or climbing stairs? 0  Dressing or bathing? 0  Doing errands, shopping? 0  Preparing Food and eating ? N  Using the Toilet? N  In the past six months, have you accidently leaked urine? N  Do you have problems with loss of bowel control? N  Managing your Medications? N  Managing your Finances? N  Housekeeping or managing your Housekeeping? N    Patient Care Team: Donita Brooks, MD as PCP - General (Family  Medicine) Daisy Lazar, DO as Consulting Physician (Optometry) Despina Hidden Amaryllis Dyke, MD as Consulting Physician (Obstetrics and Gynecology) Nita Sells, MD (Dermatology)  Indicate any recent Medical Services you may have received from other than [facility] providers in the past year (date may be approximate).     Assessment:   This is a routine wellness examination for [place].  Hearing/Vision screen Hearing Screening - Comments:: Denies hearing difficulties  Vision Screening - Comments:: up to date with routine eye exams with Dr. Charise Killian   Dietary issues and exercise activities discussed: Current Exercise Habits: Home exercise routine, Type of exercise:  walking, Time (Minutes): 40, Frequency (Times/Week): 7, Weekly Exercise (Minutes/Week): 280, Intensity: Mild   Goals Addressed             This Visit's Progress    Patient Stated       Maintain activity level       Depression Screen    07/28/2022    2:48 PM 04/27/2022    1:38 PM 04/21/2021    2:17 PM 01/25/2017    2:29 PM 11/05/2016    2:02 PM  PHQ 2/9 Scores  PHQ - 2 Score 0 0 0 0 0  PHQ- 9 Score  1   0    Fall Risk    07/28/2022    2:46 PM 04/21/2021    2:16 PM  Fall Risk   Falls in the past year? 0 0  Number falls in past yr: 0 0  Injury with Fall? 0 0  Risk for fall due to : No Fall Risks No Fall Risks  Follow up Falls evaluation completed;Education provided;Falls prevention discussed Falls evaluation completed    FALL RISK PREVENTION PERTAINING TO THE HOME:  Any stairs in or around the home? Yes  If so, are there any without handrails? No  Home free of loose throw rugs in walkways, pet beds, electrical cords, etc? Yes  Adequate lighting in your home to reduce risk of falls? Yes   ASSISTIVE DEVICES UTILIZED TO PREVENT FALLS:  Life alert? No  Use of a cane, walker or w/c? No  Grab bars in the bathroom? Yes  Shower chair or bench in shower? No  Elevated toilet seat or a handicapped toilet? Yes   TIMED UP AND GO:  Was  the test performed? Yes .  Length of time to ambulate 10 feet: 5 sec.   Gait steady and fast without use of assistive device  Cognitive Function:        07/28/2022    2:50 PM  6CIT Screen  What Year? 0 points  What month? 0 points  What time? 0 points  Count back from 20 0 points  Months in reverse 0 points  Repeat phrase 0 points  Total Score 0 points    Immunizations Immunization History  Administered Date(s) Administered   Tdap 09/18/2013, 12/05/2019    TDAP status: Up to date  Flu Vaccine status: Declined, Education has been provided regarding the importance of this vaccine but patient still declined. Advised may receive this vaccine at local pharmacy or Health Dept. Aware to provide a copy of the vaccination record if obtained from local pharmacy or Health Dept. Verbalized acceptance and understanding.  Pneumococcal vaccine status: Declined,  Education has been provided regarding the importance of this vaccine but patient still declined. Advised may receive this vaccine at local pharmacy or Health Dept. Aware to provide a copy of the vaccination record if obtained from local pharmacy or Health Dept. Verbalized acceptance and understanding.   Covid-19 vaccine status: Declined, Education has been provided regarding the importance of this vaccine but patient still declined. Advised may receive this vaccine at local pharmacy or Health Dept.or vaccine clinic. Aware to provide a copy of the vaccination record if obtained from local pharmacy or Health Dept. Verbalized acceptance and understanding.  Qualifies for Shingles Vaccine? Yes   Zostavax completed No   Shingrix Completed?: No.    Education has been provided regarding the importance of this vaccine. Patient has been advised to call insurance company to determine out of pocket expense if they have not  yet received this vaccine. Advised may also receive vaccine at local pharmacy or Health Dept. Verbalized acceptance and  understanding.  Screening Tests Health Maintenance  Topic Date Due   COVID-19 Vaccine (1) Never done   Hepatitis C Screening  Never done   COLONOSCOPY (Pts 45-60yrs Insurance coverage will need to be confirmed)  Never done   Zoster Vaccines- Shingrix (1 of 2) Never done   Pneumonia Vaccine 84+ Years old (1 - PCV) Never done   INFLUENZA VACCINE  Never done   Medicare Annual Wellness (AWV)  07/29/2023   MAMMOGRAM  04/02/2024   DTaP/Tdap/Td (3 - Td or Tdap) 12/04/2029   DEXA SCAN  Completed   HPV VACCINES  Aged Out    Health Maintenance  Health Maintenance Due  Topic Date Due   COVID-19 Vaccine (1) Never done   Hepatitis C Screening  Never done   COLONOSCOPY (Pts 45-90yrs Insurance coverage will need to be confirmed)  Never done   Zoster Vaccines- Shingrix (1 of 2) Never done   Pneumonia Vaccine 93+ Years old (1 - PCV) Never done   INFLUENZA VACCINE  Never done    Colorectal cancer screening: Type of screening: Cologuard. Completed 05/14/21. Repeat every 3 years  Mammogram status: Completed 04/02/22. Repeat every year  Bone Density status: Completed 05/14/21. Results reflect: Bone density results: OSTEOPENIA. Repeat every 2 years.  Lung Cancer Screening: (Low Dose CT Chest recommended if Age 28-80 years, 30 pack-year currently smoking OR have quit w/in 15years.) does not qualify.   Lung Cancer Screening Referral: n/a   Additional Screening:  Hepatitis C Screening: does qualify; Completed at next office visit   Vision Screening: Recommended annual ophthalmology exams for early detection of glaucoma and other disorders of the eye. Is the patient up to date with their annual eye exam?  Yes  Who is the provider or what is the name of the office in which the patient attends annual eye exams? Dr. Jorja Loa  If pt is not established with a provider, would they like to be referred to a provider to establish care? No .   Dental Screening: Recommended annual dental exams for proper  oral hygiene  Community Resource Referral / Chronic Care Management: CRR required this visit?  No   CCM required this visit?  No      Plan:     I have personally reviewed and noted the following in the patient's chart:   Medical and social history Use of alcohol, tobacco or illicit drugs  Current medications and supplements including opioid prescriptions. Patient is not currently taking opioid prescriptions. Functional ability and status Nutritional status Physical activity Advanced directives List of other physicians Hospitalizations, surgeries, and ER visits in previous 12 months Vitals Screenings to include cognitive, depression, and falls Referrals and appointments  In addition, I have reviewed and discussed with patient certain preventive protocols, quality metrics, and best practice recommendations. A written personalized care plan for preventive services as well as general preventive health recommendations were provided to patient.     Denman George [place], [place]   11/27/2704   Nurse Notes: Patient is requesting refills of Xanax and Hydrocortisone cream.  Will route as refill request.

## 2022-11-16 ENCOUNTER — Ambulatory Visit: Payer: Medicare PPO | Admitting: Family Medicine

## 2023-01-05 DIAGNOSIS — M25561 Pain in right knee: Secondary | ICD-10-CM | POA: Diagnosis not present

## 2023-01-05 DIAGNOSIS — M25562 Pain in left knee: Secondary | ICD-10-CM | POA: Diagnosis not present

## 2023-01-13 ENCOUNTER — Telehealth: Payer: Self-pay

## 2023-01-13 NOTE — Telephone Encounter (Signed)
Pt called in stating that she has been having some issues with nasal drainage. Pt stated that in the past pcp has sent in meds to help with this. Pt stated that she has tried many otc meds to see if this would help and nothing has helped thus far. Pt just wanted to know if pcp would send in something to pharmacy for sinus/nasal drainage. Please advise.  Cb#: [phone]

## 2023-02-16 ENCOUNTER — Encounter: Payer: Self-pay | Admitting: Family Medicine

## 2023-02-16 ENCOUNTER — Ambulatory Visit: Payer: Medicare PPO | Admitting: Family Medicine

## 2023-02-16 VITALS — BP 128/72 | HR 88 | Temp 98.1°F | Ht 65.0 in | Wt 157.4 lb

## 2023-02-16 DIAGNOSIS — R42 Dizziness and giddiness: Secondary | ICD-10-CM | POA: Diagnosis not present

## 2023-02-16 MED ORDER — SCOPOLAMINE 1 MG/3DAYS TD PT72: 1.0000 | MEDICATED_PATCH | TRANSDERMAL | 12 refills | Status: DC

## 2023-02-16 NOTE — Progress Notes (Signed)
Subjective:    Patient ID: Jasmine Ruiz, female    DOB: 14-May-1956, 67 y.o.   MRN: [id number]  Dizziness  Patient is a 67 year old Caucasian female who has been battling dizziness for years.  In 2020 she was referred to neurology performed an MRI of her brain which was normal and diagnosed with vertigo.  They recommended physical therapy however the patient tried physical therapy and saw no benefit.  She subsequently saw an ENT physician who tested her for Mnire's disease and diagnosed her with dizziness and disequilibrium.  The patient has been able to manage the symptoms for years on her iron.  She states that she occasionally has to hold onto the wall while standing and occasionally has to catch herself and prevent herself from falling while walking.  However over the last week symptoms have worsened.  She reports feeling extremely dizzy.  Today on exam cranial 2 through 12 gross intact and strength motor 5 points in the upper and lower extremities.  There is no evidence of any facial droop or pronator drift.  Romberg testing is normal.  Cerebellar testing is normal.  Patient has normal gait in the hallway.  There is no evidence of ataxia.  However she reports a subjective feeling of dizziness. Past Medical History:  Diagnosis Date   Arthritis    History of bronchitis 07/2013   History of MRSA infection    Insomnia    takes Ambien nightly as needed   Joint pain    Joint swelling    Migraine    takes Relpax daily as needed;last migraine end of Feb 2015   Plantar fasciitis    PONV (postoperative nausea and vomiting)    Scoliosis    Tendonitis    left should   Vertigo    Past Surgical History:  Procedure Laterality Date   ABDOMINAL HYSTERECTOMY     EXPLORATORY LAPAROTOMY     KNEE ARTHROSCOPY Left 2014   MOUTH SURGERY     TOTAL KNEE ARTHROPLASTY Left 10/13/2013   Procedure: TOTAL KNEE ARTHROPLASTY;  Surgeon: Thera Flake., MD;  Location: [facility];  Service: Orthopedics;   Laterality: Left;   Current Outpatient Medications on File Prior to Visit  Medication Sig Dispense Refill   ALPRAZolam (XANAX) 0.5 MG tablet TAKE 1 TABLET BY MOUTH EVERY DAY AT BEDTIME AS NEEDED 30 tablet 0   Ascorbic Acid (VITAMIN C) 1000 MG tablet Take 1,000 mg by mouth daily.     eletriptan (RELPAX) 40 MG tablet TAKE 1 TABLET BY MOUTH AS NEEDED MAY REPEAT IN 2 HOURS AS NEEDED FOR MIGRAINE. MAXIMUM 2 TABLETS/DAY. 52 tablet 5   hydrocortisone butyrate (LUCOID) 0.1 % CREA cream Apply 1 Application topically 2 (two) times daily. 45 g 0   Melatonin 10 MG TABS Take 10 mg by mouth.      Multiple Vitamin (MULTIVITAMIN) tablet Take 1 tablet by mouth daily.     vitamin E 100 UNIT capsule Take by mouth daily.     No current facility-administered medications on file prior to visit.     Allergies  Allergen Reactions   Hydrocodone Anaphylaxis   Percocet [Oxycodone-Acetaminophen] Anaphylaxis   Prednisone Swelling   Sulfa Antibiotics     unknown   Vicodin [Hydrocodone-Acetaminophen] Nausea And Vomiting   Social History   Socioeconomic History   Marital status: Married    Spouse name: Not on file   Number of children: Not on file   Years of education: Not on file  Highest education level: Not on file  Occupational History   Not on file  Tobacco Use   Smoking status: Never    Passive exposure: Yes   Smokeless tobacco: Never  Vaping Use   Vaping status: Never Used  Substance and Sexual Activity   Alcohol use: No   Drug use: No   Sexual activity: Yes    Birth control/protection: Surgical  Other Topics Concern   Not on file  Social History Narrative   Retired Education officer, museum    Social Determinants of Health   Financial Resource Strain: Low Risk  (07/28/2022)   Overall Financial Resource Strain (CARDIA)    Difficulty of Paying Living Expenses: Not hard at all  Food Insecurity: No Food Insecurity (07/28/2022)   Hunger Vital Sign    Worried About Running Out of Food in the  Last Year: Never true    Ran Out of Food in the Last Year: Never true  Transportation Needs: No Transportation Needs (07/28/2022)   PRAPARE - Administrator, Civil Service (Medical): No    Lack of Transportation (Non-Medical): No  Physical Activity: Sufficiently Active (07/28/2022)   Exercise Vital Sign    Days of Exercise per Week: 7 days    Minutes of Exercise per Session: 40 min  Stress: No Stress Concern Present (07/28/2022)   [employer] of Occupational Health - Occupational Stress Questionnaire    Feeling of Stress : Not at all  Social Connections: Moderately Isolated (07/28/2022)   Social Connection and Isolation Panel [NHANES]    Frequency of Communication with Friends and Family: More than three times a week    Frequency of Social Gatherings with Friends and Family: Once a week    Attends Religious Services: Never    Database administrator or Organizations: No    Attends Banker Meetings: Never    Marital Status: Married  Catering manager Violence: Not At Risk (07/28/2022)   Humiliation, Afraid, Rape, and Kick questionnaire    Fear of Current or Ex-Partner: No    Emotionally Abused: No    Physically Abused: No    Sexually Abused: No      Review of Systems  Neurological:  Positive for dizziness.  All other systems reviewed and are negative.      Objective:   Physical Exam Vitals reviewed.  Constitutional:      Appearance: Normal appearance. She is normal weight.  Cardiovascular:     Rate and Rhythm: Normal rate and regular rhythm.     Heart sounds: Normal heart sounds.  Pulmonary:     Effort: Pulmonary effort is normal.     Breath sounds: Normal breath sounds.  Neurological:     Mental Status: She is alert.           Assessment & Plan:  Dysequilibrium Patient has had a thorough evaluation in the past with no specific cause found for her disequilibrium.  Over the last week the symptoms have improved.  I will try to treat the  patient symptomatically with scopolamine patches every 72 hours as needed.  Try this for 3 or 4 days.  If not improving, consider possibly trying hydrochlorothiazide for Mnire's disease empirically.  If not improving consider an MRA of the neck to evaluate for vertebrobasilar insufficiency.

## 2023-02-19 ENCOUNTER — Telehealth: Payer: Self-pay | Admitting: Family Medicine

## 2023-02-19 NOTE — Telephone Encounter (Signed)
Patient called to ask if the VOLTAREN CREAM prescribed by her knee doctor can be used in conjunction with the recently prescribed medication scopolamine (TRANSDERM-SCOP) 1 MG/3DAYS .  Requesting call back.   Please advise at [phone].

## 2023-02-22 NOTE — Telephone Encounter (Signed)
Patient called as requested by Dr. Tanya Nones to report she experienced worsening of sx while wearing the patch; patient only wore it for 1.5 days. Patient stated she felt better when she took it off.  Requesting call back; stated she still feels dizzy (level she normally experiences).  Please advise at [phone].

## 2023-02-23 ENCOUNTER — Other Ambulatory Visit: Payer: Self-pay

## 2023-02-23 DIAGNOSIS — R42 Dizziness and giddiness: Secondary | ICD-10-CM

## 2023-02-23 MED ORDER — HYDROCHLOROTHIAZIDE 25 MG PO TABS: 25.0000 mg | ORAL_TABLET | Freq: Every day | ORAL | 1 refills | Status: DC

## 2023-02-23 NOTE — Telephone Encounter (Signed)
Pt states her dizziness is now about back to normal. Pt states she would like further work up to see what could be causing the dizziness to get worse and then get better. Pt states that you also mentioned a BP medication or ENT referral. Pt asks if either of those be a possible route to go?

## 2023-02-24 ENCOUNTER — Ambulatory Visit: Payer: Medicare PPO | Admitting: Family Medicine

## 2023-02-24 ENCOUNTER — Encounter: Payer: Self-pay | Admitting: Family Medicine

## 2023-02-24 VITALS — BP 118/68 | HR 87 | Temp 98.4°F | Ht 65.0 in | Wt 156.0 lb

## 2023-02-24 DIAGNOSIS — R1310 Dysphagia, unspecified: Secondary | ICD-10-CM | POA: Diagnosis not present

## 2023-02-24 NOTE — Assessment & Plan Note (Signed)
Jasmine Ruiz is experiencing worsening symptoms similar to her postnasal drip that are giving her the sensation of something stuck in her throat making her have to "think about it" to swallow. She denies actual difficulty swallowing or breathing but reports its taking more effort and she feels like she has to remind herself how due to something being stuck in her throat. Exam was normal. She is very anxious about this. I will refer her to ENT for further evaluation. I advised her to seek emergent medical care if she is having difficulty breathing or if she cannot swallow or is drooling.

## 2023-02-24 NOTE — Progress Notes (Signed)
Subjective:  HPI: Jasmine Ruiz is a 67 y.o. female presenting on 02/24/2023 for Follow-up (lingering thick phlegm in back of throat; feels like she can't swallow at times. Ongoing for past few mos. Wants nose and throat examined - JBG\\\)   HPI Patient is in today for a few months of thick phlegm in the back of her throat, she finds it difficult to swallow at times, it wakes her at night and she feels like she cannot breathe or swallow for a short time, associated with anxiety. She feels nauseated at times. She has a history of postnasal drip. She can breath through her nose and mouth just fine. She has no difficulty eating or drinking but does endorse "difficulty swallowing" like her throat is swollen and snot is getting in the way.  Review of Systems  All other systems reviewed and are negative.   Relevant past medical history reviewed and updated as indicated.   Past Medical History:  Diagnosis Date   Arthritis    History of bronchitis 07/2013   History of MRSA infection    Insomnia    takes Ambien nightly as needed   Joint pain    Joint swelling    Migraine    takes Relpax daily as needed;last migraine end of Feb 2015   Plantar fasciitis    PONV (postoperative nausea and vomiting)    Scoliosis    Tendonitis    left should   Vertigo      Past Surgical History:  Procedure Laterality Date   ABDOMINAL HYSTERECTOMY     EXPLORATORY LAPAROTOMY     KNEE ARTHROSCOPY Left 2014   MOUTH SURGERY     TOTAL KNEE ARTHROPLASTY Left 10/13/2013   Procedure: TOTAL KNEE ARTHROPLASTY;  Surgeon: Thera Flake., MD;  Location: [facility] OR;  Service: Orthopedics;  Laterality: Left;    Allergies and medications reviewed and updated.   Current Outpatient Medications:    ALPRAZolam (XANAX) 0.5 MG tablet, TAKE 1 TABLET BY MOUTH EVERY DAY AT BEDTIME AS NEEDED, Disp: 30 tablet, Rfl: 0   Ascorbic Acid (VITAMIN C) 1000 MG tablet, Take 1,000 mg by mouth daily., Disp: , Rfl:    eletriptan (RELPAX)  40 MG tablet, TAKE 1 TABLET BY MOUTH AS NEEDED MAY REPEAT IN 2 HOURS AS NEEDED FOR MIGRAINE. MAXIMUM 2 TABLETS/DAY., Disp: 52 tablet, Rfl: 5   hydrochlorothiazide (HYDRODIURIL) 25 MG tablet, Take 1 tablet (25 mg total) by mouth daily., Disp: 30 tablet, Rfl: 1   hydrocortisone butyrate (LUCOID) 0.1 % CREA cream, Apply 1 Application topically 2 (two) times daily., Disp: 45 g, Rfl: 0   Melatonin 10 MG TABS, Take 10 mg by mouth. , Disp: , Rfl:    Multiple Vitamin (MULTIVITAMIN) tablet, Take 1 tablet by mouth daily., Disp: , Rfl:    vitamin E 100 UNIT capsule, Take by mouth daily., Disp: , Rfl:    scopolamine (TRANSDERM-SCOP) 1 MG/3DAYS, Place 1 patch (1.5 mg total) onto the skin every 3 (three) days. (Patient not taking: Reported on 02/24/2023), Disp: 10 patch, Rfl: 12  Allergies  Allergen Reactions   Hydrocodone Anaphylaxis   Percocet [Oxycodone-Acetaminophen] Anaphylaxis   Prednisone Swelling   Sulfa Antibiotics     unknown   Vicodin [Hydrocodone-Acetaminophen] Nausea And Vomiting    Objective:   BP 118/68   Pulse 87   Temp 98.4 F (36.9 C) (Oral)   Ht 5\' 5"  (1.651 m)   Wt 156 lb (70.8 kg)   SpO2 97%  BMI 25.96 kg/m      02/24/2023   10:32 AM 02/16/2023    3:49 PM 07/28/2022    2:04 PM  Vitals with BMI  Height 5\' 5"  5\' 5"  5\' 5"   Weight 156 lbs 157 lbs 6 oz 154 lbs  BMI 25.96 26.19 25.63  Systolic 118 128 841  Diastolic 68 72 70  Pulse 87 88      Physical Exam Vitals and nursing note reviewed.  Constitutional:      Appearance: Normal appearance. She is normal weight.  HENT:     Head: Normocephalic and atraumatic.     Nose: Nose normal.     Right Turbinates: Not enlarged or swollen.     Left Turbinates: Not enlarged or swollen.     Mouth/Throat:     Pharynx: Oropharynx is clear. No pharyngeal swelling or oropharyngeal exudate.     Tonsils: No tonsillar exudate. 3+ on the right. 3+ on the left.  Skin:    General: Skin is warm and dry.  Neurological:     General: No  focal deficit present.     Mental Status: She is alert and oriented to person, place, and time. Mental status is at baseline.  Psychiatric:        Mood and Affect: Mood normal.        Behavior: Behavior normal.        Thought Content: Thought content normal.        Judgment: Judgment normal.     Assessment & Plan:  Dysphagia, unspecified type Assessment & Plan: [name] is experiencing worsening symptoms similar to her postnasal drip that are giving her the sensation of something stuck in her throat making her have to "think about it" to swallow. She denies actual difficulty swallowing or breathing but reports its taking more effort and she feels like she has to remind herself how due to something being stuck in her throat. Exam was normal. She is very anxious about this. I will refer her to ENT for further evaluation. I advised her to seek emergent medical care if she is having difficulty breathing or if she cannot swallow or is drooling.  Orders: -     Ambulatory referral to ENT     Follow up plan: Return if symptoms worsen or fail to improve.  Park Meo, FNP

## 2023-03-24 ENCOUNTER — Other Ambulatory Visit: Payer: Self-pay | Admitting: Family Medicine

## 2023-03-24 DIAGNOSIS — R42 Dizziness and giddiness: Secondary | ICD-10-CM

## 2023-03-25 NOTE — Telephone Encounter (Signed)
Requested medication (s) are due for refill today: No  Requested medication (s) are on the active medication list: Yes  Last refill:  02/23/23  Future visit scheduled: Yes  Notes to clinic:  Pharmacy requests 90 day supply and diagnosis code.    Requested Prescriptions  Pending Prescriptions Disp Refills   hydrochlorothiazide (HYDRODIURIL) 25 MG tablet [Pharmacy Med Name: HYDROCHLOROTHIAZIDE 25 MG TAB] 90 tablet 1    Sig: Take 1 tablet (25 mg total) by mouth daily.     Cardiovascular: Diuretics - Thiazide Failed - 03/24/2023  2:31 PM      Failed - Cr in normal range and within 180 days    Creat  Date Value Ref Range Status  04/21/2021 0.74 0.50 - 1.05 mg/dL Final         Failed - K in normal range and within 180 days    Potassium  Date Value Ref Range Status  04/21/2021 3.9 3.5 - 5.3 mmol/L Final         Failed - Na in normal range and within 180 days    Sodium  Date Value Ref Range Status  04/21/2021 140 135 - 146 mmol/L Final         Failed - Valid encounter within last 6 months    Recent Outpatient Visits           1 year ago Colon cancer screening   [facility] Donita Brooks, MD   3 years ago Dog bite, subsequent encounter   [facility] Pickard, Priscille Heidelberg, MD   3 years ago Repetitive strain injury of lower back, initial encounter   [facility] Pickard, Priscille Heidelberg, MD   4 years ago Rhinosinusitis   [facility] Donita Brooks, MD   6 years ago Sore throat   [facility] Dorena Bodo, PA-C              Passed - Last BP in normal range    BP Readings from Last 1 Encounters:  02/24/23 118/68

## 2023-04-07 DIAGNOSIS — H40013 Open angle with borderline findings, low risk, bilateral: Secondary | ICD-10-CM | POA: Diagnosis not present

## 2023-04-08 ENCOUNTER — Other Ambulatory Visit (HOSPITAL_COMMUNITY): Payer: Self-pay | Admitting: Obstetrics & Gynecology

## 2023-04-08 ENCOUNTER — Other Ambulatory Visit: Payer: Self-pay | Admitting: Family Medicine

## 2023-04-08 DIAGNOSIS — Z1231 Encounter for screening mammogram for malignant neoplasm of breast: Secondary | ICD-10-CM

## 2023-04-09 NOTE — Telephone Encounter (Signed)
Requested medication (s) are due for refill today:   Provider to review  Requested medication (s) are on the active medication list:   Yes  Future visit scheduled:   Yes 08/05/2023    LOV 02/24/2023 with Amber   Last ordered: 07/28/2022 45 g, 0 refills  Non delegated refill reason returned    Requested Prescriptions  Pending Prescriptions Disp Refills   hydrocortisone butyrate (LUCOID) 0.1 % CREA cream [Pharmacy Med Name: HYDROCORTISONE BUTY 0.1% CREAM] 45 g 0    Sig: APPLY TO AFFECTED AREA TWICE A DAY     Not Delegated - Dermatology:  Corticosteroids Failed - 04/08/2023  1:04 PM      Failed - This refill cannot be delegated      Failed - Valid encounter within last 12 months    Recent Outpatient Visits           1 year ago Colon cancer screening   [facility] Donita Brooks, MD   3 years ago Dog bite, subsequent encounter   [facility] Pickard, Priscille Heidelberg, MD   3 years ago Repetitive strain injury of lower back, initial encounter   [facility] Pickard, Priscille Heidelberg, MD   4 years ago Rhinosinusitis   [facility] Donita Brooks, MD   6 years ago Sore throat   [facility] Dorena Bodo, [place]

## 2023-04-14 ENCOUNTER — Encounter (HOSPITAL_COMMUNITY): Payer: Self-pay

## 2023-04-14 ENCOUNTER — Ambulatory Visit (HOSPITAL_COMMUNITY)
Admission: RE | Admit: 2023-04-14 | Discharge: 2023-04-14 | Disposition: A | Discharge status: Home / Self Care | Payer: Medicare PPO | Source: Ambulatory Visit | Attending: Obstetrics & Gynecology | Admitting: Obstetrics & Gynecology

## 2023-04-14 DIAGNOSIS — Z1231 Encounter for screening mammogram for malignant neoplasm of breast: Secondary | ICD-10-CM | POA: Diagnosis not present

## 2023-04-23 ENCOUNTER — Ambulatory Visit: Payer: Medicare PPO | Admitting: Allergy & Immunology

## 2023-04-23 ENCOUNTER — Other Ambulatory Visit: Payer: Self-pay

## 2023-04-23 ENCOUNTER — Encounter: Payer: Self-pay | Admitting: Allergy & Immunology

## 2023-04-23 VITALS — BP 128/70 | HR 103 | Temp 97.7°F | Resp 18 | Ht 65.0 in | Wt 160.4 lb

## 2023-04-23 DIAGNOSIS — J310 Chronic rhinitis: Secondary | ICD-10-CM | POA: Insufficient documentation

## 2023-04-23 DIAGNOSIS — R09A2 Foreign body sensation, throat: Secondary | ICD-10-CM | POA: Diagnosis not present

## 2023-04-23 DIAGNOSIS — R42 Dizziness and giddiness: Secondary | ICD-10-CM | POA: Diagnosis not present

## 2023-04-23 NOTE — Progress Notes (Signed)
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 Non-allergic rhinitis with globus sensation - Testing today showed: indoor molds (but it was not very impressive, to be honest) - Copy of test results provided.  - Start a daily nasal saline rinse once per nostril. - Kit and recipe provided. - This will sweep out dust and mucous from your sinuses and may help prevent the mucous ball from forming. - The ENT should stick a camera up there to see what might be going on.  - Continue with: Astepro two sprays twice daily as needed  - You can use an extra dose of the antihistamine, if needed, for breakthrough symptoms.   2. Follow up as needed.  This note in its entirety was forwarded to the Provider who requested this consultation.  Subjective:   Jasmine Ruiz is a 67 y.o. female presenting today for evaluation of  Chief Complaint  Patient presents with   Allergy Testing    Environmental: All Food: no hx    KAZUE CERRO has a history of the following: Patient Active Problem List   Diagnosis Date Noted   Dysphagia 02/24/2023   Stiffness of joint, not elsewhere classified, lower leg 11/01/2013   PONV (postoperative nausea and vomiting)    History of MRSA infection    Osteoarthritis of left knee 10/13/2013    History obtained from: chart review and patient.  Discussed the use of AI scribe software for clinical note transcription with the patient, who gave verbal consent to proceed.  Jasmine Ruiz was referred by Donita Brooks, MD.     Analycia is a 67 y.o. female presenting for an evaluation of chronic rhinitis .  The patient, with a history of vertigo, presents with a chief complaint of a sensation of a "glob of mucus" in the upper throat,  which has been present since May of the current year. The sensation was described as being located at the top of the throat, causing discomfort and fear of choking, particularly when lying on their back. The patient reported waking up occasionally feeling like they couldn't breathe.  The patient attempted self-management with antihistamines, decongestants, and expectorants, with limited success. They were prescribed Flonase by a nurse practitioner, which initially provided relief but became less effective over time. The patient then started taking Xanax at night to help with sleep.  An eye doctor advised against the use of Flonase due to a family history of glaucoma and recommended Astepro instead. The patient reported a sensation of a "pop" after starting Astepro, with some improvement in symptoms, but the sensation of the "glob" still persists, albeit less severe. The patient has a known adverse reaction to prednisone, which causes lip swelling. No antibiotics have been prescribed for the current issue.  The patient has a history of allergies and asthma in the family but has never been allergy tested. They have a pet dog at home and also volunteer at an [employer]. The patient has been living in the current area for about twenty years and has not noticed any seasonal variation in symptoms.  The patient has a history of dizziness, for which they saw an ENT specialist (Dr. Suszanne Conners) several years ago. They have been managing the dizziness with piercings, which they report to be effective.  She does not think that the ENT did any kind of rhinoscopy since they were more focused on the vertigo.  She does have an appointment to see ENT in November (November 11 with Dr. Ashok Croon). She has never had sinus surgery at all.   The patient is seeking a diagnosis and management plan for their symptoms, expressing a desire to understand what is causing the sensation and how to manage it if it recurs.  She  does have a history of migraines which are treated with the insertion of a certain earring.  Her vertigo also improved after insertion of another earring. She is very holistic and prefers to not take pills.  Otherwise, there is no history of other atopic diseases, including drug allergies, stinging insect allergies, or contact dermatitis. There is no significant infectious history. Vaccinations are up to date.    Past Medical History: Patient Active Problem List   Diagnosis Date Noted   Dysphagia 02/24/2023   Stiffness of joint, not elsewhere classified, lower leg 11/01/2013   PONV (postoperative nausea and vomiting)    History of MRSA infection    Osteoarthritis of left knee 10/13/2013    Medication List:  Allergies as of 04/23/2023       Reactions   Hydrocodone Anaphylaxis   Percocet [oxycodone-acetaminophen] Anaphylaxis   Prednisone Swelling   Sulfa Antibiotics    unknown   Vicodin [hydrocodone-acetaminophen] Nausea And Vomiting        Medication List        Accurate as of April 23, 2023  4:53 PM. If you have any questions, ask your nurse or doctor.          STOP taking these medications    scopolamine 1 MG/3DAYS Commonly known as: TRANSDERM-SCOP Stopped by: Alfonse Spruce       TAKE these medications    ALPRAZolam 0.5 MG tablet Commonly known as: XANAX TAKE 1 TABLET BY MOUTH EVERY DAY AT BEDTIME AS NEEDED   eletriptan 40 MG tablet Commonly known as: Relpax TAKE 1 TABLET BY MOUTH AS NEEDED MAY REPEAT IN 2 HOURS AS NEEDED FOR MIGRAINE. MAXIMUM 2 TABLETS/DAY.   hydrochlorothiazide 25 MG tablet Commonly known as: HYDRODIURIL TAKE 1 TABLET (25 MG TOTAL) BY MOUTH DAILY.   hydrocortisone butyrate 0.1 % Crea cream Commonly known as: LUCOID Apply 1 Application topically 2 (two) times daily.   Melatonin 10 MG Tabs Take 10 mg by mouth.   multivitamin tablet Take 1 tablet by mouth daily.   Restasis 0.05 % ophthalmic emulsion Generic drug:  cycloSPORINE 1 drop 2 (two) times daily.   vitamin C 1000 MG tablet Take 1,000 mg by mouth daily.   vitamin E 45 MG (100 UNITS) capsule Take by mouth daily.        Birth History: non-contributory  Developmental History: non-contributory  Past Surgical History: Past Surgical History:  Procedure Laterality Date   ABDOMINAL HYSTERECTOMY     EXPLORATORY LAPAROTOMY     KNEE ARTHROSCOPY Left 2014   MOUTH SURGERY     TOTAL KNEE ARTHROPLASTY Left 10/13/2013   Procedure: TOTAL KNEE ARTHROPLASTY;  Surgeon: Thera Flake., MD;  Location: [facility] OR;  Service: Orthopedics;  Laterality: Left;     Family History: Family History  Problem Relation Age of Onset   Diabetes Maternal Grandfather    Hypertension Mother    Alzheimer's disease Mother      Social History: Maysoon lives at home with her husband.  This is her fourth husband and they  have been together for 20 years now.  She grew up in [place], but retired to [place].  She lives in a house that is around 69 years old.  There is hardwood in the main living areas and large area rugs in the bedroom.  She has a heat pump for heating and central cooling.  There is 1 dog named Anette Riedel who only likes she and her husband but otherwise hates people.  She has had multiple dogs in her home in the past.  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  There are no dust or covers on the bedding.  There is no tobacco exposure.  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  There is no fume, chemical, or dust exposure.  There is no HEPA filter.  She does not live near an interstate or industrial area.   Review of systems otherwise negative other than that mentioned in the HPI.    Objective:   Blood pressure 128/70, pulse (!) 103, temperature 97.7 F (36.5 C), temperature source Temporal, resp. rate 18, height 5\' 5"  (1.651 m), weight 160 lb 6.4 oz (72.8 kg), SpO2  95%. Body mass index is 26.69 kg/m.     Physical Exam Vitals reviewed.  Constitutional:      Appearance: She is well-developed.  HENT:     Head: Normocephalic and atraumatic.     Right Ear: Tympanic membrane, ear canal and external ear normal. No drainage, swelling or tenderness. Tympanic membrane is not injected, scarred, erythematous, retracted or bulging.     Left Ear: Tympanic membrane, ear canal and external ear normal. No drainage, swelling or tenderness. Tympanic membrane is not injected, scarred, erythematous, retracted or bulging.     Nose: No nasal deformity, septal deviation, mucosal edema or rhinorrhea.     Right Turbinates: Enlarged, swollen and pale.     Left Turbinates: Enlarged, swollen and pale.     Right Sinus: No maxillary sinus tenderness or frontal sinus tenderness.     Left Sinus: No maxillary sinus tenderness or frontal sinus tenderness.     Comments: No polyps.    Mouth/Throat:     Lips: Pink.     Mouth: Mucous membranes are moist. Mucous membranes are not pale and not dry.     Pharynx: Uvula midline. No pharyngeal swelling or oropharyngeal exudate.     Tonsils: 1+ on the right. 1+ on the left.     Comments: Minimal to no cobblestoning. Eyes:     General: Allergic shiner present.        Right eye: No discharge.        Left eye: No discharge.     Conjunctiva/sclera: Conjunctivae normal.     Right eye: Right conjunctiva is not injected. No chemosis.    Left eye: Left conjunctiva is not injected. No chemosis.    Pupils: Pupils are equal, round, and reactive to light.  Cardiovascular:     Rate and Rhythm: Normal rate and regular rhythm.     Heart sounds: Normal heart sounds.  Pulmonary:     Effort: Pulmonary effort is normal. No tachypnea, accessory muscle usage or respiratory distress.     Breath sounds: Normal breath sounds. No wheezing, rhonchi or rales.  Chest:     Chest wall: No tenderness.  Abdominal:     Tenderness: There is no abdominal  tenderness. There is no guarding or rebound.  Lymphadenopathy:  Head:     Right side of head: No submandibular, tonsillar or occipital adenopathy.     Left side of head: No submandibular, tonsillar or occipital adenopathy.     Cervical: No cervical adenopathy.  Skin:    General: Skin is warm.     Capillary Refill: Capillary refill takes less than 2 seconds.     Coloration: Skin is not pale.     Findings: No abrasion, erythema, petechiae or rash. Rash is not papular, urticarial or vesicular.     Comments: Wearing multiple bracelets including copper as well as various stones that help with her healing.  Neurological:     Mental Status: She is alert.  Psychiatric:        Behavior: Behavior is cooperative.      Diagnostic studies:   Allergy Studies:     Airborne Adult Perc - 04/23/23 1523     Time Antigen Placed 1523    Allergen Manufacturer Waynette Buttery    Location Back    Number of Test 55    Panel 1 Select    2. Control-Histamine 3+    3. Bahia Negative    4. French Southern Territories Negative    5. Johnson Negative    6. Kentucky Blue Negative    7. Meadow Fescue Negative    8. Perennial Rye Negative    9. Timothy Negative    10. Ragweed Mix Negative    11. Cocklebur Negative    12. Plantain,  English Negative    13. Baccharis Negative    14. Dog Fennel Negative    15. Russian Thistle Negative    16. Lamb's Quarters Negative    17. Sheep Sorrell Negative    18. Rough Pigweed Negative    19. Marsh Elder, Rough Negative    20. Mugwort, Common Negative    21. Box, Elder Negative    22. Cedar, red Negative    23. Sweet Gum Negative    24. Pecan Pollen Negative    25. Pine Mix Negative    26. Walnut, Black Pollen Negative    27. Red Mulberry Negative    28. Ash Mix Negative    29. Birch Mix Negative    30. Beech American Negative    31. Cottonwood, Guinea-Bissau Negative    32. Hickory, White Negative    33. Maple Mix Negative    34. Oak, Guinea-Bissau Mix Negative    35. Sycamore Eastern  Negative    36. Alternaria Alternata Negative    37. Cladosporium Herbarum Negative    38. Aspergillus Mix Negative    39. Penicillium Mix Negative    40. Bipolaris Sorokiniana (Helminthosporium) Negative    41. Drechslera Spicifera (Curvularia) Negative    42. Mucor Plumbeus Negative    43. Fusarium Moniliforme Negative    44. Aureobasidium Pullulans (pullulara) Negative    45. Rhizopus Oryzae Negative    46. Botrytis Cinera Negative    47. Epicoccum Nigrum Negative    48. Phoma Betae Negative    49. Dust Mite Mix Negative    50. Cat Hair 10,000 BAU/ml Negative    51.  Dog Epithelia Negative    52. Mixed Feathers Negative    53. Horse Epithelia Negative    54. Cockroach, German Negative    55. Tobacco Leaf Negative             Intradermal - 04/23/23 1549     Time Antigen Placed 1549    Allergen Manufacturer Waynette Buttery    Location  Arm    Number of Test 16    Intradermal Select    Control Negative    Bahia Negative    French Southern Territories Negative    Johnson Negative    7 Grass Negative    Ragweed Mix Negative    Weed Mix Negative    Tree Mix Negative    Mold 1 Negative    Mold 2 Negative    Mold 3 Negative    Mold 4 2+    Mite Mix Negative    Cat Negative    Dog Negative    Cockroach Negative             Allergy testing results were read and interpreted by myself, documented by clinical staff.         Malachi Bonds, MD [facility]

## 2023-04-23 NOTE — Patient Instructions (Signed)
1. Non-allergic rhinitis with globus sensation - Testing today showed: indoor molds (but it was not very impressive, to be honest) - Copy of test results provided.  - Start a daily nasal saline rinse once per nostril. - Kit and recipe provided. - This will sweep out dust and mucous from your sinuses and may help prevent the mucous ball from forming. - The ENT should stick a camera up there to see what might be going on.  - Continue with: Astepro two sprays twice daily as needed  - You can use an extra dose of the antihistamine, if needed, for breakthrough symptoms.   2. No follow-ups on file. You can have the follow up appointment with Dr. Dellis Anes or a Nurse Practicioner (our Nurse Practitioners are excellent and always have Physician oversight!).    Please inform us of any Emergency Department visits, hospitalizations, or changes in symptoms. Call us before going to the ED for breathing or allergy symptoms since we might be able to fit you in for a sick visit. Feel free to contact us anytime with any questions, problems, or concerns.  It was a pleasure to meet you today! You are a HOOT!   Websites that have reliable patient information: 1. American Academy of Asthma, Allergy, and Immunology: www.aaaai.org 2. Food Allergy Research and Education (FARE): foodallergy.org 3. Mothers of Asthmatics: http://www.asthmacommunitynetwork.org 4. American College of Allergy, Asthma, and Immunology: www.acaai.org   COVID-19 Vaccine Information can be found at: [email or web address] For questions related to vaccine distribution or appointments, please email [email or web address] or call [phone].     "Like" [place] on Facebook and Instagram for our latest updates!      A healthy democracy works best when [employer] participate! Make sure you are registered to vote! If you have moved or changed any of your contact information, you will need to  get this updated before voting! Scan the QR codes below to learn more!     You can buy saline nose drops at a pharmacy, or you can make your own saline solution:  1. Add 1 cup (240 mL) distilled water to a clean container. If you use tap water, boil it first to sterilize it, and then let it cool until it is lukewarm.  2. Add 0.5 tsp (2.5 g) salt to the water. 3. Add 0.5 tsp (2.5 g) baking soda.   Airborne Adult Perc - 04/23/23 1523     Time Antigen Placed 1523    Allergen Manufacturer Waynette Buttery    Location Back    Number of Test 55    Panel 1 Select    2. Control-Histamine 3+    3. Bahia Negative    4. French Southern Territories Negative    5. Johnson Negative    6. Kentucky Blue Negative    7. Meadow Fescue Negative    8. Perennial Rye Negative    9. Timothy Negative    10. Ragweed Mix Negative    11. Cocklebur Negative    12. Plantain,  English Negative    13. Baccharis Negative    14. Dog Fennel Negative    15. Russian Thistle Negative    16. Lamb's Quarters Negative    17. Sheep Sorrell Negative    18. Rough Pigweed Negative    19. Marsh Elder, Rough Negative    20. Mugwort, Common Negative    21. Box, Elder Negative    22. Cedar, red Negative    23. Sweet Gum Negative    24. Pecan Pollen Negative  25. Pine Mix Negative    26. Walnut, Black Pollen Negative    27. Red Mulberry Negative    28. Ash Mix Negative    29. Birch Mix Negative    30. Beech American Negative    31. Cottonwood, Guinea[place]issau Negative    32. Hickory, White Negative    33. Maple Mix Negative    34. Oak, Guinea[place] Mix Negative    35. Sycamore Eastern Negative    36. Alternaria Alternata Negative    37. Cladosporium Herbarum Negative    38. Aspergillus Mix Negative    39. Penicillium Mix Negative    40. Bipolaris Sorokiniana (Helminthosporium) Negative    41. Drechslera Spicifera (Curvularia) Negative    42. Mucor Plumbeus Negative    43. Fusarium Moniliforme Negative    44. Aureobasidium Pullulans (pullulara)  Negative    45. Rhizopus Oryzae Negative    46. Botrytis Cinera Negative    47. Epicoccum Nigrum Negative    48. Phoma Betae Negative    49. Dust Mite Mix Negative    50. Cat Hair 10,000 BAU/ml Negative    51.  Dog Epithelia Negative    52. Mixed Feathers Negative    53. Horse Epithelia Negative    54. Cockroach, German Negative    55. Tobacco Leaf Negative             Intradermal - 04/23/23 1549     Time Antigen Placed 1549    Allergen Manufacturer Waynette Buttery    Location Arm    Number of Test 16    Intradermal Select    Control Negative    Bahia Negative    French Southern Territories Negative    Johnson Negative    7 Grass Negative    Ragweed Mix Negative    Weed Mix Negative    Tree Mix Negative    Mold 1 Negative    Mold 2 Negative    Mold 3 Negative    Mold 4 2+    Mite Mix Negative    Cat Negative    Dog Negative    Cockroach Negative            Rhinitis (Hayfever) Overview  There are two types of rhinitis: allergic and non-allergic.  Allergic Rhinitis If you have allergic rhinitis, your immune system mistakenly identifies a typically harmless substance as an intruder. This substance is called an allergen. The immune system responds to the allergen by releasing histamine and chemical mediators that typically cause symptoms in the nose, throat, eyes, ears, skin and roof of the mouth.  Seasonal allergic rhinitis (hay fever) is most often caused by pollen carried in the air during different times of the year in different parts of the country.  Allergic rhinitis can also be triggered by common indoor allergens such as the dried skin flakes, urine and saliva found on pet dander, mold, droppings from dust mites and cockroach particles. This is called perennial allergic rhinitis, as symptoms typically occur year-round.  In addition to allergen triggers, symptoms may also occur from irritants such as smoke and strong odors, or to changes in the temperature and humidity of the air. This  happens because allergic rhinitis causes inflammation in the nasal lining, which increases sensitivity to inhalants.  Many people with allergic rhinitis are prone to allergic conjunctivitis (eye allergy). In addition, allergic rhinitis can make symptoms of asthma worse for people who suffer from both conditions.  Nonallergic Rhinitis At least one out of three people with rhinitis symptoms do  not have allergies. Nonallergic rhinitis usually afflicts adults and causes year-round symptoms, especially runny nose and nasal congestion. This condition differs from allergic rhinitis because the immune system is not involved.

## 2023-04-28 ENCOUNTER — Encounter: Payer: Self-pay | Admitting: Obstetrics & Gynecology

## 2023-05-31 ENCOUNTER — Encounter: Payer: Self-pay | Admitting: Obstetrics & Gynecology

## 2023-05-31 ENCOUNTER — Other Ambulatory Visit (HOSPITAL_COMMUNITY)
Admission: RE | Admit: 2023-05-31 | Discharge: 2023-05-31 | Disposition: A | Discharge status: Home / Self Care | Payer: Medicare PPO | Source: Ambulatory Visit | Attending: Obstetrics & Gynecology | Admitting: Obstetrics & Gynecology

## 2023-05-31 ENCOUNTER — Ambulatory Visit: Payer: Medicare PPO | Admitting: Obstetrics & Gynecology

## 2023-05-31 VITALS — BP 137/75 | HR 74 | Ht 64.0 in | Wt 159.0 lb

## 2023-05-31 DIAGNOSIS — Z1151 Encounter for screening for human papillomavirus (HPV): Secondary | ICD-10-CM | POA: Insufficient documentation

## 2023-05-31 DIAGNOSIS — Z01419 Encounter for gynecological examination (general) (routine) without abnormal findings: Secondary | ICD-10-CM | POA: Insufficient documentation

## 2023-05-31 MED ORDER — OMEPRAZOLE 20 MG PO CPDR: 20.0000 mg | DELAYED_RELEASE_CAPSULE | Freq: Every day | ORAL | 6 refills | Status: DC

## 2023-05-31 NOTE — Progress Notes (Signed)
Subjective:     Jasmine Ruiz is a 67 y.o. female here for a routine exam.  No LMP recorded. Patient has had a hysterectomy. No obstetric history on file. Birth Control Method:  hysterectomy Menstrual Calendar(currently): amenorrhea  Current complaints: none.   Current acute medical issues:  GERD   Recent Gynecologic History No LMP recorded. Patient has had a hysterectomy. Last Pap: 2020,  normal Last mammogram: 03/2023,  normal  Past Medical History:  Diagnosis Date   Arthritis    History of bronchitis 07/2013   History of MRSA infection    Insomnia    takes Ambien nightly as needed   Joint pain    Joint swelling    Migraine    takes Relpax daily as needed;last migraine end of Feb 2015   Plantar fasciitis    PONV (postoperative nausea and vomiting)    Scoliosis    Tendonitis    left should   Vertigo     Past Surgical History:  Procedure Laterality Date   ABDOMINAL HYSTERECTOMY     EXPLORATORY LAPAROTOMY     KNEE ARTHROSCOPY Left 2014   MOUTH SURGERY     TOTAL KNEE ARTHROPLASTY Left 10/13/2013   Procedure: TOTAL KNEE ARTHROPLASTY;  Surgeon: Thera Flake., MD;  Location: [facility] OR;  Service: Orthopedics;  Laterality: Left;    OB History   No obstetric history on file.     Social History   Socioeconomic History   Marital status: Married    Spouse name: Not on file   Number of children: Not on file   Years of education: Not on file   Highest education level: Not on file  Occupational History   Not on file  Tobacco Use   Smoking status: Never    Passive exposure: Past   Smokeless tobacco: Never  Vaping Use   Vaping status: Never Used  Substance and Sexual Activity   Alcohol use: No   Drug use: No   Sexual activity: Yes    Birth control/protection: Surgical  Other Topics Concern   Not on file  Social History Narrative   Retired Education officer, museum    Social Determinants of Health   Financial Resource Strain: Low Risk  (07/28/2022)   Overall  Financial Resource Strain (CARDIA)    Difficulty of Paying Living Expenses: Not hard at all  Food Insecurity: No Food Insecurity (05/31/2023)   Hunger Vital Sign    Worried About Running Out of Food in the Last Year: Never true    Ran Out of Food in the Last Year: Never true  Transportation Needs: No Transportation Needs (07/28/2022)   PRAPARE - Administrator, Civil Service (Medical): No    Lack of Transportation (Non-Medical): No  Physical Activity: Sufficiently Active (07/28/2022)   Exercise Vital Sign    Days of Exercise per Week: 7 days    Minutes of Exercise per Session: 40 min  Stress: No Stress Concern Present (07/28/2022)   [employer] of Occupational Health - Occupational Stress Questionnaire    Feeling of Stress : Not at all  Social Connections: Moderately Isolated (07/28/2022)   Social Connection and Isolation Panel [NHANES]    Frequency of Communication with Friends and Family: More than three times a week    Frequency of Social Gatherings with Friends and Family: Once a week    Attends Religious Services: Never    Database administrator or Organizations: No    Attends Banker  Meetings: Never    Marital Status: Married    Family History  Problem Relation Age of Onset   Diabetes Maternal Grandfather    Hypertension Mother    Alzheimer's disease Mother      Current Outpatient Medications:    ALPRAZolam (XANAX) 0.5 MG tablet, TAKE 1 TABLET BY MOUTH EVERY DAY AT BEDTIME AS NEEDED, Disp: 30 tablet, Rfl: 0   Ascorbic Acid (VITAMIN C) 1000 MG tablet, Take 1,000 mg by mouth daily., Disp: , Rfl:    eletriptan (RELPAX) 40 MG tablet, TAKE 1 TABLET BY MOUTH AS NEEDED MAY REPEAT IN 2 HOURS AS NEEDED FOR MIGRAINE. MAXIMUM 2 TABLETS/DAY., Disp: 52 tablet, Rfl: 5   hydrocortisone butyrate (LUCOID) 0.1 % CREA cream, Apply 1 Application topically 2 (two) times daily., Disp: 45 g, Rfl: 0   Melatonin 10 MG TABS, Take 10 mg by mouth. , Disp: , Rfl:    Multiple  Vitamin (MULTIVITAMIN) tablet, Take 1 tablet by mouth daily., Disp: , Rfl:    omeprazole (PRILOSEC) 20 MG capsule, Take 1 capsule (20 mg total) by mouth daily. 1 tablet a day, Disp: 30 capsule, Rfl: 6   RESTASIS 0.05 % ophthalmic emulsion, 1 drop 2 (two) times daily., Disp: , Rfl:    vitamin E 100 UNIT capsule, Take by mouth daily., Disp: , Rfl:    hydrochlorothiazide (HYDRODIURIL) 25 MG tablet, TAKE 1 TABLET (25 MG TOTAL) BY MOUTH DAILY. (Patient not taking: Reported on 05/31/2023), Disp: 90 tablet, Rfl: 1  Review of Systems  Review of Systems  Constitutional: Negative for fever, chills, weight loss, malaise/fatigue and diaphoresis.  HENT: Negative for hearing loss, ear pain, nosebleeds, congestion, sore throat, neck pain, tinnitus and ear discharge.   Eyes: Negative for blurred vision, double vision, photophobia, pain, discharge and redness.  Respiratory: Negative for cough, hemoptysis, sputum production, shortness of breath, wheezing and stridor.   Cardiovascular: Negative for chest pain, palpitations, orthopnea, claudication, leg swelling and PND.  Gastrointestinal: negative for abdominal pain. Negative for heartburn, nausea, vomiting, diarrhea, constipation, blood in stool and melena.  Genitourinary: Negative for dysuria, urgency, frequency, hematuria and flank pain.  Musculoskeletal: Negative for myalgias, back pain, joint pain and falls.  Skin: Negative for itching and rash.  Neurological: Negative for dizziness, tingling, tremors, sensory change, speech change, focal weakness, seizures, loss of consciousness, weakness and headaches.  Endo/Heme/Allergies: Negative for environmental allergies and polydipsia. Does not bruise/bleed easily.  Psychiatric/Behavioral: Negative for depression, suicidal ideas, hallucinations, memory loss and substance abuse. The patient is not nervous/anxious and does not have insomnia.        Objective:  Blood pressure 137/75, pulse 74, height 5\' 4"  (1.626 m),  weight 159 lb (72.1 kg).   Physical Exam  Vitals reviewed. Constitutional: She is oriented to person, place, and time. She appears well-developed and well-nourished.  HENT:  Head: Normocephalic and atraumatic.        Right Ear: External ear normal.  Left Ear: External ear normal.  Nose: Nose normal.  Mouth/Throat: Oropharynx is clear and moist.  Eyes: Conjunctivae and EOM are normal. Pupils are equal, round, and reactive to light. Right eye exhibits no discharge. Left eye exhibits no discharge. No scleral icterus.  Neck: Normal range of motion. Neck supple. No tracheal deviation present. No thyromegaly present.  Cardiovascular: Normal rate, regular rhythm, normal heart sounds and intact distal pulses.  Exam reveals no gallop and no friction rub.   No murmur heard. Respiratory: Effort normal and breath sounds normal. No respiratory distress. She  has no wheezes. She has no rales. She exhibits no tenderness.  GI: Soft. Bowel sounds are normal. She exhibits no distension and no mass. There is no tenderness. There is no rebound and no guarding.  Genitourinary:  Breasts no masses skin changes or nipple changes bilaterally      Vulva is normal without lesions Vagina is pink moist without discharge Cervix normal in appearance and pap is done Uterus is normal size shape and contour Adnexa is negative with normal sized ovaries  {Rectal    hemoccult negative, normal tone, no masses  Musculoskeletal: Normal range of motion. She exhibits no edema and no tenderness.  Neurological: She is alert and oriented to person, place, and time. She has normal reflexes. She displays normal reflexes. No cranial nerve deficit. She exhibits normal muscle tone. Coordination normal.  Skin: Skin is warm and dry. No rash noted. No erythema. No pallor.  Psychiatric: She has a normal mood and affect. Her behavior is normal. Judgment and thought content normal.       Medications Ordered at today's visit: Meds ordered  this encounter  Medications   omeprazole (PRILOSEC) 20 MG capsule    Sig: Take 1 capsule (20 mg total) by mouth daily. 1 tablet a day    Dispense:  30 capsule    Refill:  6    Other orders placed at today's visit: No orders of the defined types were placed in this encounter.     Assessment:    Normal Gyn exam.    Plan:    Follow up in: 1 years.     Return in about 1 year (around 05/30/2024), or if symptoms worsen or fail to improve, for yearly.

## 2023-06-04 LAB — CYTOLOGY - PAP
Comment: NEGATIVE
Diagnosis: NEGATIVE
High risk HPV: NEGATIVE

## 2023-06-07 ENCOUNTER — Encounter (INDEPENDENT_AMBULATORY_CARE_PROVIDER_SITE_OTHER): Payer: Self-pay | Admitting: Otolaryngology

## 2023-06-07 ENCOUNTER — Ambulatory Visit (INDEPENDENT_AMBULATORY_CARE_PROVIDER_SITE_OTHER): Payer: Medicare PPO | Admitting: Otolaryngology

## 2023-06-07 VITALS — BP 122/98 | HR 87 | Ht 65.0 in

## 2023-06-07 DIAGNOSIS — J385 Laryngeal spasm: Secondary | ICD-10-CM

## 2023-06-07 DIAGNOSIS — R0989 Other specified symptoms and signs involving the circulatory and respiratory systems: Secondary | ICD-10-CM | POA: Diagnosis not present

## 2023-06-07 DIAGNOSIS — R0981 Nasal congestion: Secondary | ICD-10-CM | POA: Diagnosis not present

## 2023-06-07 DIAGNOSIS — R09A2 Foreign body sensation, throat: Secondary | ICD-10-CM | POA: Diagnosis not present

## 2023-06-07 DIAGNOSIS — R0982 Postnasal drip: Secondary | ICD-10-CM | POA: Diagnosis not present

## 2023-06-07 DIAGNOSIS — J3089 Other allergic rhinitis: Secondary | ICD-10-CM

## 2023-06-07 DIAGNOSIS — K219 Gastro-esophageal reflux disease without esophagitis: Secondary | ICD-10-CM

## 2023-06-07 MED ORDER — FAMOTIDINE 20 MG PO TABS: 20.0000 mg | ORAL_TABLET | Freq: Two times a day (BID) | ORAL | 9 refills | Status: DC

## 2023-06-07 NOTE — Patient Instructions (Addendum)
-   continue Famotidine and try reflux gourmet - continue Azelastine

## 2023-06-07 NOTE — Progress Notes (Signed)
ENT CONSULT:  Reason for Consult: dysphagia and trouble with throat clearing/choking on saliva   Discussed the use of AI scribe software for clinical note transcription with the patient, who gave verbal consent to proceed.  History of Present Illness   The patient, with a history of allergies, history of glaucoma, initially presented in the spring with a sensation of a foreign body in the upper throat, likened to a feeling of a 'bubble' in her throat. This was associated with difficulty swallowing and breathing, particularly at night, leading to episodes of waking up feeling like she was choking. The patient sought medical attention in July, 2024, where she was prescribed Flonase for suspected asthma and allergies, which was later switched to Astepro due to glaucoma concerns. However, these treatments were not effective in alleviating the throat discomfort.  The patient then self-initiated over-the-counter famotidine before supper, suspecting GERD or acid reflux. This seemed to significantly reduce the nocturnal choking episodes, improving sleep. Despite these improvements, the patient still experiences a sensation of a 'bubble' in the upper throat during the day, which is temporarily relieved by Tums or other antacids.  In an attempt to manage these symptoms, the patient has made dietary changes, increased water intake, and started walking after meals. She has also avoided certain foods, such as chocolate. Despite these efforts, the patient reports that the symptoms are not completely resolved. No trouble with swallowing of foods or liquids, but she does have intermitted choking on her saliva. No trouble breathing any more. Voice is ok without changes.       Records Reviewed:  PCP office note 04/23/23 Assessment:    Non-allergic rhinitis   Globus sensation   Prefers holistic treatments   Named after Ayaan Goodell (actress and Tax adviser)   Plan/Recommendations:    1. Non-allergic  rhinitis with globus sensation - Testing today showed: indoor molds (but it was not very impressive, to be honest) - Copy of test results provided.  - Start a daily nasal saline rinse once per nostril. - Kit and recipe provided. - This will sweep out dust and mucous from your sinuses and may help prevent the mucous ball from forming. - The ENT should stick a camera up there to see what might be going on.  - Continue with: Astepro two sprays twice daily as needed  - You can use an extra dose of the antihistamine, if needed, for breakthrough symptoms.    Past Medical History:  Diagnosis Date   Arthritis    History of bronchitis 07/2013   History of MRSA infection    Insomnia    takes Ambien nightly as needed   Joint pain    Joint swelling    Migraine    takes Relpax daily as needed;last migraine end of Feb 2015   Plantar fasciitis    PONV (postoperative nausea and vomiting)    Scoliosis    Tendonitis    left should   Vertigo     Past Surgical History:  Procedure Laterality Date   ABDOMINAL HYSTERECTOMY     EXPLORATORY LAPAROTOMY     KNEE ARTHROSCOPY Left 2014   MOUTH SURGERY     TOTAL KNEE ARTHROPLASTY Left 10/13/2013   Procedure: TOTAL KNEE ARTHROPLASTY;  Surgeon: Thera Flake., MD;  Location: [facility] OR;  Service: Orthopedics;  Laterality: Left;    Family History  Problem Relation Age of Onset   Diabetes Maternal Grandfather    Hypertension Mother    Alzheimer's disease Mother  Social History:  reports that she has never smoked. She has been exposed to tobacco smoke. She has never used smokeless tobacco. She reports that she does not drink alcohol and does not use drugs.  Allergies:  Allergies  Allergen Reactions   Hydrocodone Anaphylaxis   Percocet [Oxycodone-Acetaminophen] Anaphylaxis   Prednisone Swelling   Sulfa Antibiotics     unknown   Vicodin [Hydrocodone-Acetaminophen] Nausea And Vomiting    Medications: I have reviewed the patient's current  medications.  The PMH, PSH, Medications, Allergies, and SH were reviewed and updated.  ROS: Constitutional: Negative for fever, weight loss and weight gain. Cardiovascular: Negative for chest pain and dyspnea on exertion. Respiratory: Is not experiencing shortness of breath at rest. Gastrointestinal: Negative for nausea and vomiting. Neurological: Negative for headaches. Psychiatric: The patient is not nervous/anxious  Blood pressure (!) 122/98, pulse 87, height 5\' 5"  (1.651 m), SpO2 97%.  PHYSICAL EXAM:  Exam: General: Well-developed, well-nourished Communication and Voice: Clear pitch and clarity Respiratory Respiratory effort: Equal inspiration and expiration without stridor Cardiovascular Peripheral Vascular: Warm extremities with equal color/perfusion Eyes: No nystagmus with equal extraocular motion bilaterally Neuro/Psych/Balance: Patient oriented to person, place, and time; Appropriate mood and affect; Gait is intact with no imbalance; Cranial nerves I-XII are intact Head and Face Inspection: Normocephalic and atraumatic without mass or lesion Palpation: Facial skeleton intact without bony stepoffs Salivary Glands: No mass or tenderness Facial Strength: Facial motility symmetric and full bilaterally ENT Pinna: External ear intact and fully developed External canal: Canal is patent with intact skin Tympanic Membrane: Clear and mobile External Nose: No scar or anatomic deformity Internal Nose: Septum is deviated to the left. No polyp, or purulence. Mucosal edema and erythema present.  Bilateral inferior turbinate hypertrophy.  Lips, Teeth, and gums: Mucosa and teeth intact and viable TMJ: No pain to palpation with full mobility Oral cavity/oropharynx: No erythema or exudate, no lesions present Nasopharynx: No mass or lesion with intact mucosa Hypopharynx: Intact mucosa without pooling of secretions Larynx Glottic: Full true vocal cord mobility without lesion or  mass Supraglottic: Normal appearing epiglottis and AE folds Interarytenoid Space: No or minimal pachydermia or edema Subglottic Space: Patent without lesion or edema Neck Neck and Trachea: Midline trachea without mass or lesion Thyroid: No mass or nodularity Lymphatics: No lymphadenopathy  Procedure: Preoperative diagnosis: choking episodes   Postoperative diagnosis:   Same + GERD LPR   Procedure: Flexible fiberoptic laryngoscopy  Surgeon: Ashok Croon, MD  Anesthesia: Topical lidocaine and Afrin Complications: None Condition is stable throughout exam  Indications and consent:  The patient presents to the clinic with Indirect laryngoscopy view was incomplete. Thus it was recommended that they undergo a flexible fiberoptic laryngoscopy. All of the risks, benefits, and potential complications were reviewed with the patient preoperatively and verbal informed consent was obtained.  Procedure: The patient was seated upright in the clinic. Topical lidocaine and Afrin were applied to the nasal cavity. After adequate anesthesia had occurred, I then proceeded to pass the flexible telescope into the nasal cavity. The nasal cavity was patent without rhinorrhea or polyp. The nasopharynx was also patent without mass or lesion. The base of tongue was visualized and was normal. There were no signs of pooling of secretions in the piriform sinuses. The true vocal folds were mobile bilaterally. There were no signs of glottic or supraglottic mucosal lesion or mass. There was moderate interarytenoid pachydermia and post cricoid edema. The telescope was then slowly withdrawn and the patient tolerated the procedure throughout.  Studies Reviewed: Allergy testing 04/26/23   Assessment/Plan: Encounter Diagnoses  Name Primary?   Globus sensation    Chronic GERD Yes   Chronic nasal congestion    Environmental and seasonal allergies    Choking sensation    Laryngospasm    Post-nasal drip      Assessment and Plan    Gastroesophageal Reflux Disease (GERD) Chronic sensation of a lump in the throat, difficulty swallowing saliva, and nocturnal choking episodes since May 2024. Symptoms improved with dietary changes and famotidine. Physical exam and scope revealed reflux irritation on flexible scope exam. Discussed risks of untreated GERD, including potential for Barrett's esophagitis. Famotidine preferred for long-term use due to better side effect profile compared to omeprazole. Esophagram discussed but deferred. - Continue famotidine 20 mg before  consider doing BID if sx worsen - Recommend Reflux Gourmet supplement after meals and at bedtime - Continue dietary modifications - Avoid alcohol and acidic beverages - Educate on rescue breathing technique for laryngospasm - Consider esophagram if symptoms worsen - Schedule follow-up in a few months  Allergic Rhinitis/environmental allergies  Nasal congestion and postnasal drip. Previous treatments with Flonase and Astepro. Physical exam showed nasal congestion and cobblestoning of pharyngeal wall indicative of allergies/PND. Flonase discontinued due to glaucoma risk. Discussed proper nasal spray technique to avoid septal irritation. - Continue Astepro 2 puffs b/l nares nasal spray once/twice daily - Educate on proper nasal spray technique to avoid septal irritation  General Health Maintenance Discussion on lifestyle modifications and dietary changes to manage GERD. Emphasis on avoiding triggers and maintaining hydration. - Continue walking after meals - Drink plenty of water - Avoid chocolate and other known reflux triggers - Explore resources on reflux management, including Albertina Parr books and website  Follow-up - Schedule follow-up in a few months - Contact office if symptoms worsen or for esophagram setup.        Thank you for allowing me to participate in the care of this patient. Please do not hesitate to contact me  with any questions or concerns.   Ashok Croon, MD Otolaryngology [facility] Phone: [phone] Fax: [phone]    06/07/2023, 11:25 AM

## 2023-07-13 DIAGNOSIS — M25562 Pain in left knee: Secondary | ICD-10-CM | POA: Diagnosis not present

## 2023-07-13 DIAGNOSIS — M545 Low back pain, unspecified: Secondary | ICD-10-CM | POA: Diagnosis not present

## 2023-08-05 ENCOUNTER — Ambulatory Visit (INDEPENDENT_AMBULATORY_CARE_PROVIDER_SITE_OTHER): Payer: Medicare PPO | Admitting: *Deleted

## 2023-08-05 DIAGNOSIS — Z0000 Encounter for general adult medical examination without abnormal findings: Secondary | ICD-10-CM

## 2023-08-05 NOTE — Progress Notes (Signed)
 Subjective:   Jasmine Ruiz is a 68 y.o. female who presents for Medicare Annual (Subsequent) preventive examination.  Visit Complete: Virtual I connected with  Cierra B Cefalu on 08/05/23 by a audio enabled telemedicine application and verified that I am speaking with the correct person using two identifiers.  Patient Location: Home  Provider Location: Home Office  I discussed the limitations of evaluation and management by telemedicine. The patient expressed understanding and agreed to proceed.  Vital Signs: Because this visit was a virtual/telehealth visit, some criteria may be missing or patient reported. Any vitals not documented were not able to be obtained and vitals that have been documented are patient reported.  Unable to connect to video    Cardiac Risk Factors include: advanced age (>64men, >11 women)     Objective:    There were no vitals filed for this visit. There is no height or weight on file to calculate BMI.     08/05/2023    3:31 PM 07/28/2022    2:49 PM 08/27/2020    1:11 PM 12/05/2019    5:20 PM 04/26/2019    4:17 PM 10/03/2013   10:08 AM  Advanced Directives  Does Patient Have a Medical Advance Directive? Yes No Yes No No Patient does not have advance directive;Patient would not like information  Type of Advance Directive Living will       Does patient want to make changes to medical advance directive?   Yes (Inpatient - patient defers changing a medical advance directive at this time - Information given)     Would patient like information on creating a medical advance directive?  Yes (MAU/Ambulatory/Procedural Areas - Information given)   No - Patient declined   Pre-existing out of facility DNR order (yellow form or pink MOST form)      No    Current Medications (verified) Outpatient Encounter Medications as of 08/05/2023  Medication Sig   ALPRAZolam  (XANAX ) 0.5 MG tablet TAKE 1 TABLET BY MOUTH EVERY DAY AT BEDTIME AS NEEDED   Ascorbic Acid (VITAMIN C)  1000 MG tablet Take 1,000 mg by mouth daily.   azelastine (ASTELIN) 0.1 % nasal spray Place into both nostrils 2 (two) times daily. Use in each nostril as directed   eletriptan  (RELPAX ) 40 MG tablet TAKE 1 TABLET BY MOUTH AS NEEDED MAY REPEAT IN 2 HOURS AS NEEDED FOR MIGRAINE. MAXIMUM 2 TABLETS/DAY.   famotidine  (PEPCID ) 20 MG tablet Take 1 tablet (20 mg total) by mouth 2 (two) times daily.   hydrocortisone  butyrate (LUCOID) 0.1 % CREA cream Apply 1 Application topically 2 (two) times daily.   Melatonin 10 MG TABS Take 10 mg by mouth.    Multiple Vitamin (MULTIVITAMIN) tablet Take 1 tablet by mouth daily.   RESTASIS 0.05 % ophthalmic emulsion 1 drop 2 (two) times daily.   vitamin E 100 UNIT capsule Take by mouth daily.   hydrochlorothiazide  (HYDRODIURIL ) 25 MG tablet TAKE 1 TABLET (25 MG TOTAL) BY MOUTH DAILY. (Patient not taking: Reported on 06/07/2023)   omeprazole  (PRILOSEC) 20 MG capsule Take 1 capsule (20 mg total) by mouth daily. 1 tablet a day (Patient not taking: Reported on 06/07/2023)   No facility-administered encounter medications on file as of 08/05/2023.    Allergies (verified) Hydrocodone, Percocet [oxycodone-acetaminophen ], Prednisone, Sulfa antibiotics, and Vicodin [hydrocodone-acetaminophen ]   History: Past Medical History:  Diagnosis Date   Arthritis    History of bronchitis 07/2013   History of MRSA infection    Insomnia  takes Ambien  nightly as needed   Joint pain    Joint swelling    Migraine    takes Relpax  daily as needed;last migraine end of Feb 2015   Plantar fasciitis    PONV (postoperative nausea and vomiting)    Scoliosis    Tendonitis    left should   Vertigo    Past Surgical History:  Procedure Laterality Date   ABDOMINAL HYSTERECTOMY     EXPLORATORY LAPAROTOMY     KNEE ARTHROSCOPY Left 2014   MOUTH SURGERY     TOTAL KNEE ARTHROPLASTY Left 10/13/2013   Procedure: TOTAL KNEE ARTHROPLASTY;  Surgeon: LELON JONETTA Shari Mickey., MD;  Location: [facility] OR;   Service: Orthopedics;  Laterality: Left;   Family History  Problem Relation Age of Onset   Diabetes Maternal Grandfather    Hypertension Mother    Alzheimer's disease Mother    Social History   Socioeconomic History   Marital status: Married    Spouse name: Not on file   Number of children: Not on file   Years of education: Not on file   Highest education level: Not on file  Occupational History   Not on file  Tobacco Use   Smoking status: Never    Passive exposure: Past   Smokeless tobacco: Never  Vaping Use   Vaping status: Never Used  Substance and Sexual Activity   Alcohol use: No   Drug use: No   Sexual activity: Yes    Birth control/protection: Surgical  Other Topics Concern   Not on file  Social History Narrative   Retired education officer, museum    Social Drivers of Health   Financial Resource Strain: Low Risk  (08/05/2023)   Overall Financial Resource Strain (CARDIA)    Difficulty of Paying Living Expenses: Not hard at all  Food Insecurity: No Food Insecurity (08/05/2023)   Hunger Vital Sign    Worried About Running Out of Food in the Last Year: Never true    Ran Out of Food in the Last Year: Never true  Transportation Needs: No Transportation Needs (08/05/2023)   PRAPARE - Administrator, Civil Service (Medical): No    Lack of Transportation (Non-Medical): No  Physical Activity: Sufficiently Active (08/05/2023)   Exercise Vital Sign    Days of Exercise per Week: 3 days    Minutes of Exercise per Session: 50 min  Stress: No Stress Concern Present (08/05/2023)   [employer] of Occupational Health - Occupational Stress Questionnaire    Feeling of Stress : Not at all  Social Connections: Moderately Integrated (08/05/2023)   Social Connection and Isolation Panel [NHANES]    Frequency of Communication with Friends and Family: More than three times a week    Frequency of Social Gatherings with Friends and Family: Twice a week    Attends Religious  Services: Never    Database Administrator or Organizations: Yes    Attends Engineer, Structural: More than 4 times per year    Marital Status: Married    Tobacco Counseling Counseling given: Not Answered   Clinical Intake:                        Activities of Daily Living    08/05/2023    3:30 PM  In your present state of health, do you have any difficulty performing the following activities:  Hearing? 0  Vision? 0  Difficulty concentrating or making decisions? 0  Walking or climbing stairs? 0  Dressing or bathing? 0  Doing errands, shopping? 0  Preparing Food and eating ? N  Using the Toilet? N  In the past six months, have you accidently leaked urine? N  Do you have problems with loss of bowel control? N  Managing your Medications? N  Managing your Finances? N  Housekeeping or managing your Housekeeping? N    Patient Care Team: Duanne Butler DASEN, MD as PCP - General (Family Medicine) Darroll Anes, DO as Consulting Physician (Optometry) Gabrella Vonn DEL, MD as Consulting Physician (Obstetrics and Gynecology) Shona Rush, MD (Dermatology)  Indicate any recent Medical Services you may have received from other than [facility] providers in the past year (date may be approximate).     Assessment:   This is a routine wellness examination for Sheniqua.  Hearing/Vision screen Hearing Screening - Comments:: No trouble hearing  Vision Screening - Comments:: Up to date [facility]   Goals Addressed             This Visit's Progress    Patient Stated       Maitain current lifestyle       Depression Screen    08/05/2023    3:33 PM 02/16/2023    3:53 PM 07/28/2022    2:48 PM 04/27/2022    1:38 PM 04/21/2021    2:17 PM 01/25/2017    2:29 PM 11/05/2016    2:02 PM  PHQ 2/9 Scores  PHQ - 2 Score 3 0 0 0 0 0 0  PHQ- 9 Score 4   1   0    Fall Risk    02/16/2023    3:53 PM 07/28/2022    2:46 PM 04/21/2021    2:16 PM  Fall Risk   Falls in the  past year? 0 0 0  Number falls in past yr: 0 0 0  Injury with Fall? 0 0 0  Risk for fall due to : No Fall Risks No Fall Risks No Fall Risks  Follow up Falls prevention discussed Falls evaluation completed;Education provided;Falls prevention discussed Falls evaluation completed    MEDICARE RISK AT HOME: Medicare Risk at Home Any stairs in or around the home?: Yes If so, are there any without handrails?: No Home free of loose throw rugs in walkways, pet beds, electrical cords, etc?: Yes Adequate lighting in your home to reduce risk of falls?: Yes Life alert?: No Use of a cane, walker or w/c?: No Grab bars in the bathroom?: Yes Shower chair or bench in shower?: No Elevated toilet seat or a handicapped toilet?: Yes  TIMED UP AND GO:  Was the test performed?  No    Cognitive Function:        08/05/2023    3:29 PM 07/28/2022    2:50 PM  6CIT Screen  What Year? 0 points 0 points  What month? 0 points 0 points  What time? 0 points 0 points  Count back from 20 0 points 0 points  Months in reverse 0 points 0 points  Repeat phrase 0 points 0 points  Total Score 0 points 0 points    Immunizations Immunization History  Administered Date(s) Administered   Tdap 09/18/2013, 12/05/2019    TDAP status: Up to date  Flu Vaccine status: Declined, Education has been provided regarding the importance of this vaccine but patient still declined. Advised may receive this vaccine at local pharmacy or Health Dept. Aware to provide a copy of the vaccination  record if obtained from local pharmacy or Health Dept. Verbalized acceptance and understanding.  Pneumococcal vaccine status: Declined,  Education has been provided regarding the importance of this vaccine but patient still declined. Advised may receive this vaccine at local pharmacy or Health Dept. Aware to provide a copy of the vaccination record if obtained from local pharmacy or Health Dept. Verbalized acceptance and understanding.    Covid-19 vaccine status: Declined, Education has been provided regarding the importance of this vaccine but patient still declined. Advised may receive this vaccine at local pharmacy or Health Dept.or vaccine clinic. Aware to provide a copy of the vaccination record if obtained from local pharmacy or Health Dept. Verbalized acceptance and understanding.  Qualifies for Shingles Vaccine? Yes   Zostavax completed No   Shingrix Completed?: No.    Education has been provided regarding the importance of this vaccine. Patient has been advised to call insurance company to determine out of pocket expense if they have not yet received this vaccine. Advised may also receive vaccine at local pharmacy or Health Dept. Verbalized acceptance and understanding.  Screening Tests Health Maintenance  Topic Date Due   COVID-19 Vaccine (1 - 2024-25 season) 08/21/2023 (Originally 03/28/2023)   Colonoscopy  09/05/2023 (Originally 04/19/1999)   INFLUENZA VACCINE  10/25/2023 (Originally 02/25/2023)   Pneumonia Vaccine 85+ Years old (1 of 1 - PCV) 08/01/2024 (Originally 04/19/2019)   Zoster Vaccines- Shingrix (1 of 2) 08/02/2024 (Originally 04/18/2004)   Medicare Annual Wellness (AWV)  08/04/2024   MAMMOGRAM  04/13/2025   DTaP/Tdap/Td (3 - Td or Tdap) 12/04/2029   DEXA SCAN  Completed   HPV VACCINES  Aged Out   Hepatitis C Screening  Discontinued    Health Maintenance  There are no preventive care reminders to display for this patient.   Colorectal cancer screening: Type of screening: Cologuard. Completed 2022. Repeat every 3 years  Mammogram status: Completed  . Repeat every year  Bone Density status: Completed 2022. Results reflect: Bone density results: OSTEOPENIA. Repeat every 5 years.  Lung Cancer Screening: (Low Dose CT Chest recommended if Age 47-80 years, 20 pack-year currently smoking OR have quit w/in 15years.) does not qualify.   Lung Cancer Screening Referral:   Additional Screening:  Hepatitis  C Screening:  never done  Vision Screening: Recommended annual ophthalmology exams for early detection of glaucoma and other disorders of the eye. Is the patient up to date with their annual eye exam?  Yes  Who is the provider or what is the name of the office in which the patient attends annual eye exams? [facility] If pt is not established with a provider, would they like to be referred to a provider to establish care? No .   Dental Screening: Recommended annual dental exams for proper oral hygiene    Community Resource Referral / Chronic Care Management: CRR required this visit?  No   CCM required this visit?  No     Plan:     I have personally reviewed and noted the following in the patient's chart:   Medical and social history Use of alcohol, tobacco or illicit drugs  Current medications and supplements including opioid prescriptions. Patient is not currently taking opioid prescriptions. Functional ability and status Nutritional status Physical activity Advanced directives List of other physicians Hospitalizations, surgeries, and ER visits in previous 12 months Vitals Screenings to include cognitive, depression, and falls Referrals and appointments  In addition, I have reviewed and discussed with patient certain preventive protocols, quality metrics,  and best practice recommendations. A written personalized care plan for preventive services as well as general preventive health recommendations were provided to patient.     Mliss Graff, LPN   [id number]   After Visit Summary: (MyChart) Due to this being a telephonic visit, the after visit summary with patients personalized plan was offered to patient via MyChart   Nurse Notes:

## 2023-08-05 NOTE — Patient Instructions (Signed)
 Ms. Jasmine Ruiz , Thank you for taking time to come for your Medicare Wellness Visit. I appreciate your ongoing commitment to your health goals. Please review the following plan we discussed and let me know if I can assist you in the future.   Screening recommendations/referrals: Colonoscopy: up to date   Cologuard Recommended yearly ophthalmology/optometry visit for glaucoma screening and checkup Recommended yearly dental visit for hygiene and checkup  Vaccinations: Influenza vaccine: Education provided Pneumococcal vaccine: Education provided Tdap vaccine: up to date Shingles vaccine: Education provided      Preventive Care 65 Years and Older, Female Preventive care refers to lifestyle choices and visits with your health care provider that can promote health and wellness. What does preventive care include? A yearly physical exam. This is also called an annual well check. Dental exams once or twice a year. Routine eye exams. Ask your health care provider how often you should have your eyes checked. Personal lifestyle choices, including: Daily care of your teeth and gums. Regular physical activity. Eating a healthy diet. Avoiding tobacco and drug use. Limiting alcohol use. Practicing safe sex. Taking low doses of aspirin every day. Taking vitamin and mineral supplements as recommended by your health care provider. What happens during an annual well check? The services and screenings done by your health care provider during your annual well check will depend on your age, overall health, lifestyle risk factors, and family history of disease. Counseling  Your health care provider may ask you questions about your: Alcohol use. Tobacco use. Drug use. Emotional well-being. Home and relationship well-being. Sexual activity. Eating habits. History of falls. Memory and ability to understand (cognition). Work and work astronomer. Screening  You may have the following tests or  measurements: Height, weight, and BMI. Blood pressure. Lipid and cholesterol levels. These may be checked every 5 years, or more frequently if you are over 70 years old. Skin check. Lung cancer screening. You may have this screening every year starting at age 79 if you have a 30-pack-year history of smoking and currently smoke or have quit within the past 15 years. Fecal occult blood test (FOBT) of the stool. You may have this test every year starting at age 73. Flexible sigmoidoscopy or colonoscopy. You may have a sigmoidoscopy every 5 years or a colonoscopy every 10 years starting at age 40. Prostate cancer screening. Recommendations will vary depending on your family history and other risks. Hepatitis C blood test. Hepatitis B blood test. Sexually transmitted disease (STD) testing. Diabetes screening. This is done by checking your blood sugar (glucose) after you have not eaten for a while (fasting). You may have this done every 1-3 years. Abdominal aortic aneurysm (AAA) screening. You may need this if you are a current or former smoker. Osteoporosis. You may be screened starting at age 67 if you are at high risk. Talk with your health care provider about your test results, treatment options, and if necessary, the need for more tests. Vaccines  Your health care provider may recommend certain vaccines, such as: Influenza vaccine. This is recommended every year. Tetanus, diphtheria, and acellular pertussis (Tdap, Td) vaccine. You may need a Td booster every 10 years. Zoster vaccine. You may need this after age 5. Pneumococcal 13-valent conjugate (PCV13) vaccine. One dose is recommended after age 26. Pneumococcal polysaccharide (PPSV23) vaccine. One dose is recommended after age 23. Talk to your health care provider about which screenings and vaccines you need and how often you need them. This information is not intended  to replace advice given to you by your health care provider. Make sure  you discuss any questions you have with your health care provider. Document Released: 08/09/2015 Document Revised: 04/01/2016 Document Reviewed: 05/14/2015 Elsevier Interactive Patient Education  2017 [employer].  Fall Prevention in the Home Falls can cause injuries. They can happen to people of all ages. There are many things you can do to make your home safe and to help prevent falls. What can I do on the outside of my home? Regularly fix the edges of walkways and driveways and fix any cracks. Remove anything that might make you trip as you walk through a door, such as a raised step or threshold. Trim any bushes or trees on the path to your home. Use bright outdoor lighting. Clear any walking paths of anything that might make someone trip, such as rocks or tools. Regularly check to see if handrails are loose or broken. Make sure that both sides of any steps have handrails. Any raised decks and porches should have guardrails on the edges. Have any leaves, snow, or ice cleared regularly. Use sand or salt on walking paths during winter. Clean up any spills in your garage right away. This includes oil or grease spills. What can I do in the bathroom? Use night lights. Install grab bars by the toilet and in the tub and shower. Do not use towel bars as grab bars. Use non-skid mats or decals in the tub or shower. If you need to sit down in the shower, use a plastic, non-slip stool. Keep the floor dry. Clean up any water that spills on the floor as soon as it happens. Remove soap buildup in the tub or shower regularly. Attach bath mats securely with double-sided non-slip rug tape. Do not have throw rugs and other things on the floor that can make you trip. What can I do in the bedroom? Use night lights. Make sure that you have a light by your bed that is easy to reach. Do not use any sheets or blankets that are too big for your bed. They should not hang down onto the floor. Have a firm  chair that has side arms. You can use this for support while you get dressed. Do not have throw rugs and other things on the floor that can make you trip. What can I do in the kitchen? Clean up any spills right away. Avoid walking on wet floors. Keep items that you use a lot in easy-to-reach places. If you need to reach something above you, use a strong step stool that has a grab bar. Keep electrical cords out of the way. Do not use floor polish or wax that makes floors slippery. If you must use wax, use non-skid floor wax. Do not have throw rugs and other things on the floor that can make you trip. What can I do with my stairs? Do not leave any items on the stairs. Make sure that there are handrails on both sides of the stairs and use them. Fix handrails that are broken or loose. Make sure that handrails are as long as the stairways. Check any carpeting to make sure that it is firmly attached to the stairs. Fix any carpet that is loose or worn. Avoid having throw rugs at the top or bottom of the stairs. If you do have throw rugs, attach them to the floor with carpet tape. Make sure that you have a light switch at the top of the stairs and  the bottom of the stairs. If you do not have them, ask someone to add them for you. What else can I do to help prevent falls? Wear shoes that: Do not have high heels. Have rubber bottoms. Are comfortable and fit you well. Are closed at the toe. Do not wear sandals. If you use a stepladder: Make sure that it is fully opened. Do not climb a closed stepladder. Make sure that both sides of the stepladder are locked into place. Ask someone to hold it for you, if possible. Clearly mark and make sure that you can see: Any grab bars or handrails. First and last steps. Where the edge of each step is. Use tools that help you move around (mobility aids) if they are needed. These include: Canes. Walkers. Scooters. Crutches. Turn on the lights when you go  into a dark area. Replace any light bulbs as soon as they burn out. Set up your furniture so you have a clear path. Avoid moving your furniture around. If any of your floors are uneven, fix them. If there are any pets around you, be aware of where they are. Review your medicines with your doctor. Some medicines can make you feel dizzy. This can increase your chance of falling. Ask your doctor what other things that you can do to help prevent falls. This information is not intended to replace advice given to you by your health care provider. Make sure you discuss any questions you have with your health care provider. Document Released: 05/09/2009 Document Revised: 12/19/2015 Document Reviewed: 08/17/2014 Elsevier Interactive Patient Education  2017 [employer].

## 2023-08-10 DIAGNOSIS — M533 Sacrococcygeal disorders, not elsewhere classified: Secondary | ICD-10-CM | POA: Diagnosis not present

## 2023-08-10 DIAGNOSIS — M545 Low back pain, unspecified: Secondary | ICD-10-CM | POA: Diagnosis not present

## 2023-08-18 DIAGNOSIS — M461 Sacroiliitis, not elsewhere classified: Secondary | ICD-10-CM | POA: Diagnosis not present

## 2023-09-05 ENCOUNTER — Other Ambulatory Visit (INDEPENDENT_AMBULATORY_CARE_PROVIDER_SITE_OTHER): Payer: Self-pay | Admitting: Otolaryngology

## 2023-10-04 ENCOUNTER — Ambulatory Visit (INDEPENDENT_AMBULATORY_CARE_PROVIDER_SITE_OTHER): Payer: Medicare PPO | Admitting: Otolaryngology

## 2023-10-04 ENCOUNTER — Encounter (INDEPENDENT_AMBULATORY_CARE_PROVIDER_SITE_OTHER): Payer: Self-pay | Admitting: Otolaryngology

## 2023-10-04 VITALS — BP 129/76 | HR 66

## 2023-10-04 DIAGNOSIS — R0982 Postnasal drip: Secondary | ICD-10-CM

## 2023-10-04 DIAGNOSIS — J3089 Other allergic rhinitis: Secondary | ICD-10-CM

## 2023-10-04 DIAGNOSIS — K219 Gastro-esophageal reflux disease without esophagitis: Secondary | ICD-10-CM | POA: Diagnosis not present

## 2023-10-04 DIAGNOSIS — R09A2 Foreign body sensation, throat: Secondary | ICD-10-CM

## 2023-10-04 DIAGNOSIS — R0981 Nasal congestion: Secondary | ICD-10-CM

## 2023-10-04 DIAGNOSIS — M26629 Arthralgia of temporomandibular joint, unspecified side: Secondary | ICD-10-CM

## 2023-10-04 NOTE — Patient Instructions (Signed)
 [employer] - check out this website to learn more about reflux   -Avoid lying down for at least two hours after a meal or after drinking acidic beverages, like soda, or other caffeinated beverages. This can help to prevent stomach contents from flowing back into the esophagus. -Keep your head elevated while you sleep. Using an extra pillow or two can also help to prevent reflux. -Eat smaller and more frequent meals each day instead of a few large meals. This promotes digestion and can aid in preventing heartburn. -Wear loose-fitting clothes to ease pressure on the stomach, which can worsen heartburn and reflux. -Reduce excess weight around the midsection. This can ease pressure on the stomach. Such pressure can force some stomach contents back up the esophagus

## 2023-10-04 NOTE — Progress Notes (Unsigned)
 ENT Progress Note:   Update 10/05/2023  Discussed the use of AI scribe software for clinical note transcription with the patient, who gave verbal consent to proceed.  History of Present Illness   The patient presents for f/u, reports improvement in reflux symptoms, throat clearing.  She has experienced significant improvement in her symptoms since implementing dietary changes and using famotidine and Astepro nasal spray. Tomato-based foods and caffeine exacerbate her symptoms, leading her to adjust her diet by avoiding these triggers, not eating after a certain time, and avoiding late-night alcohol consumption. When consuming small amounts of trigger foods, her symptoms are mild and resolve with a few doses of her treatments. She finds the Reflux Gourmet product both palatable and beneficial.  She has a long-standing issue with ear discomfort, particularly when lying on her ear without air exposure, causing a temporary 'funny' feeling and pain that resolves upon changing position. She experiences tinnitus her ears and occasional popping sounds, which she attributes to TMJ-related issues. She would like to hold off on hearing evaluation at this time.   Records Reviewed Initial Evaluation  Reason for Consult: dysphagia and trouble with throat clearing/choking on saliva   Discussed the use of AI scribe software for clinical note transcription with the patient, who gave verbal consent to proceed.  History of Present Illness   The patient, with a history of allergies, history of glaucoma, initially presented in the spring with a sensation of a foreign body in the upper throat, likened to a feeling of a 'bubble' in her throat. This was associated with difficulty swallowing and breathing, particularly at night, leading to episodes of waking up feeling like she was choking. The patient sought medical attention in July, 2024, where she was prescribed Flonase for suspected asthma and allergies, which was  later switched to Astepro due to glaucoma concerns. However, these treatments were not effective in alleviating the throat discomfort.  The patient then self-initiated over-the-counter famotidine before supper, suspecting GERD or acid reflux. This seemed to significantly reduce the nocturnal choking episodes, improving sleep. Despite these improvements, the patient still experiences a sensation of a 'bubble' in the upper throat during the day, which is temporarily relieved by Tums or other antacids.  In an attempt to manage these symptoms, the patient has made dietary changes, increased water intake, and started walking after meals. She has also avoided certain foods, such as chocolate. Despite these efforts, the patient reports that the symptoms are not completely resolved. No trouble with swallowing of foods or liquids, but she does have intermitted choking on her saliva. No trouble breathing any more. Voice is ok without changes.       Records Reviewed:  PCP office note 04/23/23 Assessment:    Non-allergic rhinitis   Globus sensation   Prefers holistic treatments   Named after Kacey Dysert (actress and Tax adviser)   Plan/Recommendations:    1. Non-allergic rhinitis with globus sensation - Testing today showed: indoor molds (but it was not very impressive, to be honest) - Copy of test results provided.  - Start a daily nasal saline rinse once per nostril. - Kit and recipe provided. - This will sweep out dust and mucous from your sinuses and may help prevent the mucous ball from forming. - The ENT should stick a camera up there to see what might be going on.  - Continue with: Astepro two sprays twice daily as needed  - You can use an extra dose of the antihistamine, if needed, for breakthrough  symptoms.    Past Medical History:  Diagnosis Date   Arthritis    History of bronchitis 07/2013   History of MRSA infection    Insomnia    takes Ambien nightly as needed   Joint  pain    Joint swelling    Migraine    takes Relpax daily as needed;last migraine end of Feb 2015   Plantar fasciitis    PONV (postoperative nausea and vomiting)    Scoliosis    Tendonitis    left should   Vertigo     Past Surgical History:  Procedure Laterality Date   ABDOMINAL HYSTERECTOMY     EXPLORATORY LAPAROTOMY     KNEE ARTHROSCOPY Left 2014   MOUTH SURGERY     TOTAL KNEE ARTHROPLASTY Left 10/13/2013   Procedure: TOTAL KNEE ARTHROPLASTY;  Surgeon: Thera Flake., MD;  Location: [facility] OR;  Service: Orthopedics;  Laterality: Left;    Family History  Problem Relation Age of Onset   Diabetes Maternal Grandfather    Hypertension Mother    Alzheimer's disease Mother     Social History:  reports that she has never smoked. She has been exposed to tobacco smoke. She has never used smokeless tobacco. She reports that she does not drink alcohol and does not use drugs.  Allergies:  Allergies  Allergen Reactions   Hydrocodone Anaphylaxis   Percocet [Oxycodone-Acetaminophen] Anaphylaxis   Prednisone Swelling   Sulfa Antibiotics     unknown   Vicodin [Hydrocodone-Acetaminophen] Nausea And Vomiting    Medications: I have reviewed the patient's current medications.  The PMH, PSH, Medications, Allergies, and SH were reviewed and updated.  ROS: Constitutional: Negative for fever, weight loss and weight gain. Cardiovascular: Negative for chest pain and dyspnea on exertion. Respiratory: Is not experiencing shortness of breath at rest. Gastrointestinal: Negative for nausea and vomiting. Neurological: Negative for headaches. Psychiatric: The patient is not nervous/anxious  Blood pressure 129/76, pulse 66, SpO2 97%.  PHYSICAL EXAM:  Exam: General: Well-developed, well-nourished Communication and Voice: Clear pitch and clarity Respiratory Respiratory effort: Equal inspiration and expiration without stridor Cardiovascular Peripheral Vascular: Warm extremities with equal  color/perfusion Eyes: No nystagmus with equal extraocular motion bilaterally Neuro/Psych/Balance: Patient oriented to person, place, and time; Appropriate mood and affect; Gait is intact with no imbalance; Cranial nerves I-XII are intact Head and Face Inspection: Normocephalic and atraumatic without mass or lesion Palpation: Facial skeleton intact without bony stepoffs Salivary Glands: No mass or tenderness Facial Strength: Facial motility symmetric and full bilaterally ENT Pinna: External ear intact and fully developed External canal: Canal is patent with intact skin Tympanic Membrane: Clear and mobile External Nose: No scar or anatomic deformity Lips, Teeth, and gums: Mucosa and teeth intact and viable TMJ: No pain to palpation with full mobility Oral cavity/oropharynx: No erythema or exudate, no lesions present Neck Neck and Trachea: Midline trachea without mass or lesion Thyroid: No mass or nodularity Lymphatics: No lymphadenopathy  Procedure: Preoperative diagnosis: choking episodes   Postoperative diagnosis:   Same + GERD LPR   Procedure: Flexible fiberoptic laryngoscopy  Surgeon: Ashok Croon, MD  Anesthesia: Topical lidocaine and Afrin Complications: None Condition is stable throughout exam  Indications and consent:  The patient presents to the clinic with Indirect laryngoscopy view was incomplete. Thus it was recommended that they undergo a flexible fiberoptic laryngoscopy. All of the risks, benefits, and potential complications were reviewed with the patient preoperatively and verbal informed consent was obtained.  Procedure: The patient was seated upright  in the clinic. Topical lidocaine and Afrin were applied to the nasal cavity. After adequate anesthesia had occurred, I then proceeded to pass the flexible telescope into the nasal cavity. The nasal cavity was patent without rhinorrhea or polyp. The nasopharynx was also patent without mass or lesion. The base of  tongue was visualized and was normal. There were no signs of pooling of secretions in the piriform sinuses. The true vocal folds were mobile bilaterally. There were no signs of glottic or supraglottic mucosal lesion or mass. There was moderate interarytenoid pachydermia and post cricoid edema. The telescope was then slowly withdrawn and the patient tolerated the procedure throughout.  Studies Reviewed: Allergy testing 04/26/23   Assessment/Plan: Encounter Diagnoses  Name Primary?   Globus sensation Yes   Chronic GERD    Chronic nasal congestion    Environmental and seasonal allergies    Post-nasal drip    Arthralgia of temporomandibular joint, unspecified laterality      Assessment and Plan    Gastroesophageal Reflux Disease (GERD) Chronic sensation of a lump in the throat, difficulty swallowing saliva, and nocturnal choking episodes since May 2024. Symptoms improved with dietary changes and famotidine. Physical exam and scope revealed reflux irritation on flexible scope exam. Discussed risks of untreated GERD, including potential for Barrett's esophagitis. Famotidine preferred for long-term use due to better side effect profile compared to omeprazole. Esophagram discussed but deferred. - Continue famotidine 20 mg before  consider doing BID if sx worsen - Recommend Reflux Gourmet supplement after meals and at bedtime - Continue dietary modifications - Avoid alcohol and acidic beverages - Educate on rescue breathing technique for laryngospasm - Consider esophagram if symptoms worsen - Schedule follow-up in a few months  Allergic Rhinitis/environmental allergies  Nasal congestion and postnasal drip. Previous treatments with Flonase and Astepro. Physical exam showed nasal congestion and cobblestoning of pharyngeal wall indicative of allergies/PND. Flonase discontinued due to glaucoma risk. Discussed proper nasal spray technique to avoid septal irritation. - Continue Astepro 2 puffs b/l  nares nasal spray once/twice daily - Educate on proper nasal spray technique to avoid septal irritation  General Health Maintenance Discussion on lifestyle modifications and dietary changes to manage GERD. Emphasis on avoiding triggers and maintaining hydration. - Continue walking after meals - Drink plenty of water - Avoid chocolate and other known reflux triggers - Explore resources on reflux management, including Albertina Parr books and website  Follow-up - Schedule follow-up in a few months - Contact office if symptoms worsen or for esophagram setup.      Update 10/05/2023 Assessment and Plan    Chronic throat clearing likely related to GERD Laryngopharyngeal Reflux (LPR) and post-nasal drainage  Symptoms have significantly improved with dietary modifications and the use of famotidine and Astepro nasal spray. She reports identifying and avoiding trigger foods such as tomato-based products and caffeine, which has alleviated symptoms. She no longer experiences dysphagia or throat clearing. The use of Reflux Gourmet has been beneficial, and she appreciates its natural composition.  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 The ear examination was normal, with no signs of infection or fluid accumulation. The tinnitus in the ears is likely due to some form of hearing loss, which is common with aging. She is not interested in pursuing a hearing test at this time. - Consider hearing test in the future  if desired  - information on TMJ sx management provided         Ashok Croon, MD Otolaryngology [facility] Phone: [phone] Fax: [phone]    10/05/2023, 1:01 PM

## 2023-11-05 DIAGNOSIS — M25561 Pain in right knee: Secondary | ICD-10-CM | POA: Diagnosis not present

## 2023-11-11 ENCOUNTER — Other Ambulatory Visit: Payer: Self-pay | Admitting: Family Medicine

## 2023-11-11 NOTE — Telephone Encounter (Signed)
 Copied from CRM [id number]. Topic: Clinical - Medication Refill >> Nov 11, 2023  1:30 PM DeAngela L wrote: Most Recent Primary Care Visit:  Provider: GREER, JULIE M  Department: [facility]  Visit Type: MEDICARE AWV, SEQUENTIAL  Date: 08/05/2023  Medication: ALPRAZolam (XANAX) 0.5 MG tablet   Has the patient contacted their pharmacy? Yes they said she needs to contact Pcp (Agent: If no, request that the patient contact the pharmacy for the refill. If patient does not wish to contact the pharmacy document the reason why and proceed with request.) (Agent: If yes, when and what did the pharmacy advise?)  Is this the correct pharmacy for this prescription? Yes If no, delete pharmacy and type the correct one.  This is the patient's preferred pharmacy:  [id number] - [place], [place] - [address] AT [facility] [address] [place] [address] Phone: [phone] Fax: [phone]   Has the prescription been filled recently? No   Is the patient out of the medication? Yes   Has the patient been seen for an appointment in the last year OR does the patient have an upcoming appointment? Yes   Can we respond through MyChart? No   Agent: Please be advised that Rx refills may take up to 3 business days. We ask that you follow-up with your pharmacy.

## 2023-11-12 NOTE — Telephone Encounter (Signed)
 Requested medication (s) are due for refill today -yes  Requested medication (s) are on the active medication list -yes  Future visit scheduled -no  Last refill: 07/28/22 #30  Notes to clinic: non delegated Rx  Requested Prescriptions  Pending Prescriptions Disp Refills   ALPRAZolam  (XANAX ) 0.5 MG tablet 30 tablet 0    Sig: TAKE 1 TABLET BY MOUTH EVERY DAY AT BEDTIME AS NEEDED     Not Delegated - Psychiatry: Anxiolytics/Hypnotics 2 Failed - 11/12/2023  8:22 AM      Failed - This refill cannot be delegated      Failed - Urine Drug Screen completed in last 360 days      Passed - Patient is not pregnant      Passed - Valid encounter within last 6 months    Recent Outpatient Visits           8 months ago Dysphagia, unspecified type   [place] [facility] Kayla Jeoffrey RAMAN, FNP   8 months ago Dysequilibrium   [place] [facility] Pickard, Butler DASEN, MD                 Requested Prescriptions  Pending Prescriptions Disp Refills   ALPRAZolam  (XANAX ) 0.5 MG tablet 30 tablet 0    Sig: TAKE 1 TABLET BY MOUTH EVERY DAY AT BEDTIME AS NEEDED     Not Delegated - Psychiatry: Anxiolytics/Hypnotics 2 Failed - 11/12/2023  8:22 AM      Failed - This refill cannot be delegated      Failed - Urine Drug Screen completed in last 360 days      Passed - Patient is not pregnant      Passed - Valid encounter within last 6 months    Recent Outpatient Visits           8 months ago Dysphagia, unspecified type   [place] [facility] Kayla Jeoffrey RAMAN, FNP   8 months ago Dysequilibrium   [place] [facility] Pickard, Butler DASEN, MD

## 2024-03-10 ENCOUNTER — Other Ambulatory Visit (HOSPITAL_COMMUNITY): Payer: Self-pay | Admitting: Obstetrics & Gynecology

## 2024-03-10 DIAGNOSIS — Z1231 Encounter for screening mammogram for malignant neoplasm of breast: Secondary | ICD-10-CM

## 2024-04-17 ENCOUNTER — Ambulatory Visit (HOSPITAL_COMMUNITY)
Admission: RE | Admit: 2024-04-17 | Discharge: 2024-04-17 | Disposition: A | Discharge status: Home / Self Care | Source: Ambulatory Visit | Attending: Obstetrics & Gynecology | Admitting: Obstetrics & Gynecology

## 2024-04-17 DIAGNOSIS — Z1231 Encounter for screening mammogram for malignant neoplasm of breast: Secondary | ICD-10-CM | POA: Insufficient documentation

## 2024-04-19 ENCOUNTER — Ambulatory Visit: Payer: Self-pay | Admitting: *Deleted

## 2024-04-28 ENCOUNTER — Encounter: Payer: Self-pay | Admitting: Family Medicine

## 2024-04-28 ENCOUNTER — Ambulatory Visit: Admitting: Family Medicine

## 2024-04-28 VITALS — BP 120/68 | HR 72 | Temp 98.1°F | Ht 65.0 in | Wt 158.0 lb

## 2024-04-28 DIAGNOSIS — G4709 Other insomnia: Secondary | ICD-10-CM

## 2024-04-28 DIAGNOSIS — J029 Acute pharyngitis, unspecified: Secondary | ICD-10-CM | POA: Diagnosis not present

## 2024-04-28 DIAGNOSIS — J302 Other seasonal allergic rhinitis: Secondary | ICD-10-CM

## 2024-04-28 NOTE — Progress Notes (Signed)
 Patient Office Visit  Assessment & Plan:  Sore throat -     STREP GROUP A AG, W/REFLEX TO CULT -     Culture, Group A Strep  Seasonal allergies  Other insomnia   Assessment and Plan    Acute right-sided pharyngitis  Allergic rhinitis Chronic condition exacerbated in fall. Symptoms include postnasal drip and globus sensation. Using Astepro due to Flonase  concerns.  Gastroesophageal reflux disease (GERD) Chronic GERD with overlapping symptoms of postnasal drip and globus sensation with allergic rhinitis.  Insomnia Chronic insomnia with recent exacerbation. Xanax  causes grogginess; melatonin variably effective. Better response to Ambien . Regular exercise aids sleep.     She can discuss other options for insomnia with Dr. Duanne Patient will try over-the-counter Claritin or Allegra  or Zyrtec during the day to see if this will help.  If worsening symptoms no improvement she will notify us . Return if symptoms worsen or fail to improve.   Subjective:    Patient ID: Jasmine Ruiz, female    DOB: 04-27-1956  Age: 68 y.o. MRN: [id number]  Chief Complaint  Patient presents with   Sore Throat    Pt woke up yesterday with a sore throat. Pt states that she has bad fall allergies. She states that she does not feel bad.     Sore Throat    Discussed the use of AI scribe software for clinical note transcription with the patient, who gave verbal consent to proceed.  History of Present Illness        History of Present Illness Jasmine Ruiz is a 68 year old female who presents with a sore throat and concerns about possible strep exposure. patient's daughter is dealing with metastatic breast cancer  She woke up in the middle of the night on Wednesday with a sore throat, more pronounced on the right side when swallowing. She also developed a fever blister on her lip, which is unusual for her. She has a history of fall allergies and attempted to manage her symptoms with salt water  gargles and honey lemon tea, but these did not provide relief.  A friend informed her that another friend had strep throat, prompting her to seek medical evaluation to prevent spreading it to others. She describes her voice as sounding hoarse, 'like I've been smoking cigars my whole life,' although she does not smoke. No body aches, flu-like symptoms, or feeling generally unwell, but she notes pain when swallowing.  She has a history of insomnia and has used Xanax  and Ambien  in the past, but finds Xanax  makes her groggy the next morning. She currently takes melatonin nightly, which has variable effectiveness. She walks daily for exercise.  No coughing, shortness of breath, or wheezing. She has not been exposed to flu or COVID-19 to her knowledge. She uses Astepro for her allergies due to concerns about glaucoma with nasal steroids. She has a history of GERD and postnasal drip, and her ENT recommended Flonase , but she uses Astepro due to concerns about glaucoma risk with nasal steroids.  Physical Exam HEENT: Ears normal CHEST: Lungs clear to auscultation, no wheezing  Assessment and Plan Acute right-sided pharyngitis  Allergic rhinitis Chronic condition exacerbated in fall. Symptoms include postnasal drip and globus sensation. Using Astepro due to Flonase  concerns.  Gastroesophageal reflux disease (GERD) Chronic GERD with overlapping symptoms of postnasal drip and globus sensation with allergic rhinitis.  Insomnia Chronic insomnia with recent exacerbation. Xanax  causes grogginess; melatonin variably effective. Better response to Ambien . Regular exercise aids sleep.  The ASCVD Risk score (Arnett DK, et al., 2019) failed to calculate for the following reasons:   Cannot find a previous HDL lab   Cannot find a previous total cholesterol lab  Past Medical History:  Diagnosis Date   Arthritis    History of bronchitis 07/2013   History of MRSA infection    Insomnia    takes Ambien   nightly as needed   Joint pain    Joint swelling    Migraine    takes Relpax  daily as needed;last migraine end of Feb 2015   Plantar fasciitis    PONV (postoperative nausea and vomiting)    Scoliosis    Tendonitis    left should   Vertigo    Past Surgical History:  Procedure Laterality Date   ABDOMINAL HYSTERECTOMY     EXPLORATORY LAPAROTOMY     KNEE ARTHROSCOPY Left 2014   MOUTH SURGERY     TOTAL KNEE ARTHROPLASTY Left 10/13/2013   Procedure: TOTAL KNEE ARTHROPLASTY;  Surgeon: LELON JONETTA Shari Mickey., MD;  Location: [facility] OR;  Service: Orthopedics;  Laterality: Left;   Social History   Tobacco Use   Smoking status: Never    Passive exposure: Past   Smokeless tobacco: Never  Vaping Use   Vaping status: Never Used  Substance Use Topics   Alcohol use: No   Drug use: No   Family History  Problem Relation Age of Onset   Diabetes Maternal Grandfather    Hypertension Mother    Alzheimer's disease Mother    Allergies  Allergen Reactions   Hydrocodone Anaphylaxis   Percocet [Oxycodone-Acetaminophen ] Anaphylaxis   Prednisone Swelling   Sulfa Antibiotics     unknown   Vicodin [Hydrocodone-Acetaminophen ] Nausea And Vomiting    ROS    Objective:    BP 120/68   Pulse 72   Temp 98.1 F (36.7 C)   Ht 5' 5 (1.651 m)   Wt 158 lb (71.7 kg)   SpO2 98%   BMI 26.29 kg/m  BP Readings from Last 3 Encounters:  04/28/24 120/68  10/04/23 129/76  06/07/23 (!) 122/98   Wt Readings from Last 3 Encounters:  04/28/24 158 lb (71.7 kg)  05/31/23 159 lb (72.1 kg)  04/23/23 160 lb 6.4 oz (72.8 kg)    Physical Exam Vitals and nursing note reviewed.  Constitutional:      General: She is not in acute distress.    Appearance: Normal appearance.  HENT:     Head: Normocephalic.     Right Ear: Tympanic membrane, ear canal and external ear normal.     Left Ear: Tympanic membrane, ear canal and external ear normal.     Nose: Congestion present.     Mouth/Throat:     Pharynx: Oropharynx  is clear. Uvula midline. No oropharyngeal exudate or posterior oropharyngeal erythema.  Eyes:     Extraocular Movements: Extraocular movements intact.     Pupils: Pupils are equal, round, and reactive to light.  Cardiovascular:     Rate and Rhythm: Normal rate and regular rhythm.     Heart sounds: Normal heart sounds.  Pulmonary:     Effort: Pulmonary effort is normal.     Breath sounds: Normal breath sounds.  Musculoskeletal:     Right lower leg: No edema.     Left lower leg: No edema.  Neurological:     General: No focal deficit present.     Mental Status: She is alert and oriented to person, place, and time.  Psychiatric:  Mood and Affect: Mood normal.        Behavior: Behavior normal.      Results for orders placed or performed in visit on 04/28/24  STREP GROUP A AG, W/REFLEX TO CULT   Specimen: Throat  Result Value Ref Range   Streptococcus Group A AG NOT DETECTED NOT DETECTED

## 2024-04-30 LAB — CULTURE, GROUP A STREP
Micro Number: 17053699
SPECIMEN QUALITY:: ADEQUATE

## 2024-04-30 LAB — STREP GROUP A AG, W/REFLEX TO CULT: Streptococcus Group A AG: NOT DETECTED

## 2024-05-01 ENCOUNTER — Ambulatory Visit: Payer: Self-pay | Admitting: Family Medicine

## 2024-05-01 DIAGNOSIS — H40013 Open angle with borderline findings, low risk, bilateral: Secondary | ICD-10-CM | POA: Diagnosis not present

## 2024-05-01 NOTE — Progress Notes (Signed)
 Left vm for pt to return call

## 2024-05-18 DIAGNOSIS — M1711 Unilateral primary osteoarthritis, right knee: Secondary | ICD-10-CM | POA: Diagnosis not present

## 2024-08-31 ENCOUNTER — Ambulatory Visit
